# Patient Record
Sex: Female | Born: 1945 | Race: Black or African American | Hispanic: No | Smoking: Never smoker
Health system: Southern US, Community
[De-identification: ages and names within clinical notes are randomized; demographics above are authoritative.]

## PROBLEM LIST (undated history)

## (undated) DIAGNOSIS — E7800 Pure hypercholesterolemia, unspecified: Secondary | ICD-10-CM

## (undated) DIAGNOSIS — E119 Type 2 diabetes mellitus without complications: Secondary | ICD-10-CM

## (undated) HISTORY — PX: EYE SURGERY: SHX253

---

## 2018-06-21 DIAGNOSIS — Z86711 Personal history of pulmonary embolism: Secondary | ICD-10-CM

## 2018-06-21 HISTORY — DX: Personal history of pulmonary embolism: Z86.711

## 2018-10-19 ENCOUNTER — Encounter: Payer: Self-pay | Admitting: Family Medicine

## 2018-10-19 ENCOUNTER — Other Ambulatory Visit: Payer: Self-pay

## 2018-10-19 ENCOUNTER — Ambulatory Visit (INDEPENDENT_AMBULATORY_CARE_PROVIDER_SITE_OTHER): Payer: Medicare Other | Admitting: Family Medicine

## 2018-10-19 VITALS — BP 118/78 | HR 72 | Temp 96.5°F | Ht 63.0 in | Wt 138.0 lb

## 2018-10-19 DIAGNOSIS — Z7689 Persons encountering health services in other specified circumstances: Secondary | ICD-10-CM | POA: Diagnosis not present

## 2018-10-19 DIAGNOSIS — K219 Gastro-esophageal reflux disease without esophagitis: Secondary | ICD-10-CM | POA: Diagnosis not present

## 2018-10-19 DIAGNOSIS — E785 Hyperlipidemia, unspecified: Secondary | ICD-10-CM | POA: Diagnosis not present

## 2018-10-19 DIAGNOSIS — I2699 Other pulmonary embolism without acute cor pulmonale: Secondary | ICD-10-CM | POA: Diagnosis not present

## 2018-10-19 MED ORDER — APIXABAN 5 MG PO TABS: 5.0000 mg | ORAL_TABLET | Freq: Two times a day (BID) | ORAL | 4 refills | Status: DC

## 2018-10-19 MED ORDER — ATORVASTATIN CALCIUM 20 MG PO TABS: 20.0000 mg | ORAL_TABLET | Freq: Every day | ORAL | 3 refills | Status: DC

## 2018-10-19 MED ORDER — OMEPRAZOLE 20 MG PO CPDR: 20.0000 mg | DELAYED_RELEASE_CAPSULE | Freq: Every day | ORAL | 3 refills | Status: DC

## 2018-10-19 NOTE — Patient Instructions (Signed)
Food Choices for Gastroesophageal Reflux Disease, Adult When you have gastroesophageal reflux disease (GERD), the foods you eat and your eating habits are very important. Choosing the right foods can help ease your discomfort. Think about working with a nutrition specialist (dietitian) to help you make good choices. What are tips for following this plan?  Meals  Choose healthy foods that are low in fat, such as fruits, vegetables, whole grains, low-fat dairy products, and lean meat, fish, and poultry.  Eat small meals often instead of 3 large meals a day. Eat your meals slowly, and in a place where you are relaxed. Avoid bending over or lying down until 2-3 hours after eating.  Avoid eating meals 2-3 hours before bed.  Avoid drinking a lot of liquid with meals.  Cook foods using methods other than frying. Bake, grill, or broil food instead.  Avoid or limit: ? Chocolate. ? Peppermint or spearmint. ? Alcohol. ? Pepper. ? Black and decaffeinated coffee. ? Black and decaffeinated tea. ? Bubbly (carbonated) soft drinks. ? Caffeinated energy drinks and soft drinks.  Limit high-fat foods such as: ? Fatty meat or fried foods. ? Whole milk, cream, butter, or ice cream. ? Nuts and nut butters. ? Pastries, donuts, and sweets made with butter or shortening.  Avoid foods that cause symptoms. These foods may be different for everyone. Common foods that cause symptoms include: ? Tomatoes. ? Oranges, lemons, and limes. ? Peppers. ? Spicy food. ? Onions and garlic. ? Vinegar. Lifestyle  Maintain a healthy weight. Ask your doctor what weight is healthy for you. If you need to lose weight, work with your doctor to do so safely.  Exercise for at least 30 minutes for 5 or more days each week, or as told by your doctor.  Wear loose-fitting clothes.  Do not smoke. If you need help quitting, ask your doctor.  Sleep with the head of your bed higher than your feet. Use a wedge under the  mattress or blocks under the bed frame to raise the head of the bed. Summary  When you have gastroesophageal reflux disease (GERD), food and lifestyle choices are very important in easing your symptoms.  Eat small meals often instead of 3 large meals a day. Eat your meals slowly, and in a place where you are relaxed.  Limit high-fat foods such as fatty meat or fried foods.  Avoid bending over or lying down until 2-3 hours after eating.  Avoid peppermint and spearmint, caffeine, alcohol, and chocolate. This information is not intended to replace advice given to you by your health care provider. Make sure you discuss any questions you have with your health care provider. Document Released: 08/11/2011 Document Revised: 06/02/2018 Document Reviewed: 03/17/2016 Elsevier Patient Education  2020 [employer]  Gastroesophageal Reflux Disease, Adult Gastroesophageal reflux (GER) happens when acid from the stomach flows up into the tube that connects the mouth and the stomach (esophagus). Normally, food travels down the esophagus and stays in the stomach to be digested. With GER, food and stomach acid sometimes move back up into the esophagus. You may have a disease called gastroesophageal reflux disease (GERD) if the reflux:  Happens often.  Causes frequent or very bad symptoms.  Causes problems such as damage to the esophagus. When this happens, the esophagus becomes sore and swollen (inflamed). Over time, GERD can make small holes (ulcers) in the lining of the esophagus. What are the causes? This condition is caused by a problem with the muscle between the esophagus  and the stomach. When this muscle is weak or not normal, it does not close properly to keep food and acid from coming back up from the stomach. The muscle can be weak because of:  Tobacco use.  Pregnancy.  Having a certain type of hernia (hiatal hernia).  Alcohol use.  Certain foods and drinks, such as coffee, chocolate,  onions, and peppermint. What increases the risk? You are more likely to develop this condition if you:  Are overweight.  Have a disease that affects your connective tissue.  Use NSAID medicines. What are the signs or symptoms? Symptoms of this condition include:  Heartburn.  Difficult or painful swallowing.  The feeling of having a lump in the throat.  A bitter taste in the mouth.  Bad breath.  Having a lot of saliva.  Having an upset or bloated stomach.  Belching.  Chest pain. Different conditions can cause chest pain. Make sure you see your doctor if you have chest pain.  Shortness of breath or noisy breathing (wheezing).  Ongoing (chronic) cough or a cough at night.  Wearing away of the surface of teeth (tooth enamel).  Weight loss. How is this treated? Treatment will depend on how bad your symptoms are. Your doctor may suggest:  Changes to your diet.  Medicine.  Surgery. Follow these instructions at home: Eating and drinking   Follow a diet as told by your doctor. You may need to avoid foods and drinks such as: ? Coffee and tea (with or without caffeine). ? Drinks that contain alcohol. ? Energy drinks and sports drinks. ? Bubbly (carbonated) drinks or sodas. ? Chocolate and cocoa. ? Peppermint and mint flavorings. ? Garlic and onions. ? Horseradish. ? Spicy and acidic foods. These include peppers, chili powder, curry powder, vinegar, hot sauces, and BBQ sauce. ? Citrus fruit juices and citrus fruits, such as oranges, lemons, and limes. ? Tomato-based foods. These include red sauce, chili, salsa, and pizza with red sauce. ? Fried and fatty foods. These include donuts, french fries, potato chips, and high-fat dressings. ? High-fat meats. These include hot dogs, rib eye steak, sausage, ham, and bacon. ? High-fat dairy items, such as whole milk, butter, and cream cheese.  Eat small meals often. Avoid eating large meals.  Avoid drinking large amounts  of liquid with your meals.  Avoid eating meals during the 2-3 hours before bedtime.  Avoid lying down right after you eat.  Do not exercise right after you eat. Lifestyle   Do not use any products that contain nicotine or tobacco. These include cigarettes, e-cigarettes, and chewing tobacco. If you need help quitting, ask your doctor.  Try to lower your stress. If you need help doing this, ask your doctor.  If you are overweight, lose an amount of weight that is healthy for you. Ask your doctor about a safe weight loss goal. General instructions  Pay attention to any changes in your symptoms.  Take over-the-counter and prescription medicines only as told by your doctor. Do not take aspirin, ibuprofen, or other NSAIDs unless your doctor says it is okay.  Wear loose clothes. Do not wear anything tight around your waist.  Raise (elevate) the head of your bed about 6 inches (15 cm).  Avoid bending over if this makes your symptoms worse.  Keep all follow-up visits as told by your doctor. This is important. Contact a doctor if:  You have new symptoms.  You lose weight and you do not know why.  You have trouble  swallowing or it hurts to swallow.  You have wheezing or a cough that keeps happening.  Your symptoms do not get better with treatment.  You have a hoarse voice. Get help right away if:  You have pain in your arms, neck, jaw, teeth, or back.  You feel sweaty, dizzy, or light-headed.  You have chest pain or shortness of breath.  You throw up (vomit) and your throw-up looks like blood or coffee grounds.  You pass out (faint).  Your poop (stool) is bloody or black.  You cannot swallow, drink, or eat. Summary  If a person has gastroesophageal reflux disease (GERD), food and stomach acid move back up into the esophagus and cause symptoms or problems such as damage to the esophagus.  Treatment will depend on how bad your symptoms are.  Follow a diet as told by  your doctor.  Take all medicines only as told by your doctor. This information is not intended to replace advice given to you by your health care provider. Make sure you discuss any questions you have with your health care provider. Document Released: 07/29/2007 Document Revised: 08/18/2017 Document Reviewed: 08/18/2017 Elsevier Patient Education  2020 [employer].  Dyslipidemia Dyslipidemia is an imbalance of waxy, fat-like substances (lipids) in the blood. The body needs lipids in small amounts. Dyslipidemia often involves a high level of cholesterol or triglycerides, which are types of lipids. Common forms of dyslipidemia include:  High levels of LDL cholesterol. LDL is the type of cholesterol that causes fatty deposits (plaques) to build up in the blood vessels that carry blood away from your heart (arteries).  Low levels of HDL cholesterol. HDL cholesterol is the type of cholesterol that protects against heart disease. High levels of HDL remove the LDL buildup from arteries.  High levels of triglycerides. Triglycerides are a fatty substance in the blood that is linked to a buildup of plaques in the arteries. What are the causes? Primary dyslipidemia is caused by changes (mutations) in genes that are passed down through families (inherited). These mutations cause several types of dyslipidemia. Secondary dyslipidemia is caused by lifestyle choices and diseases that lead to dyslipidemia, such as:  Eating a diet that is high in animal fat.  Not getting enough exercise.  Having diabetes, kidney disease, liver disease, or thyroid disease.  Drinking large amounts of alcohol.  Using certain medicines. What increases the risk? You are more likely to develop this condition if you are an older man or if you are a woman who has gone through menopause. Other risk factors include:  Having a family history of dyslipidemia.  Taking certain medicines, including birth control pills, steroids,  some diuretics, and beta-blockers.  Smoking cigarettes.  Eating a high-fat diet.  Having certain medical conditions such as diabetes, polycystic ovary syndrome (PCOS), kidney disease, liver disease, or hypothyroidism.  Not exercising regularly.  Being overweight or obese with too much belly fat. What are the signs or symptoms? In most cases, dyslipidemia does not usually cause any symptoms. In severe cases, very high lipid levels can cause:  Fatty bumps under the skin (xanthomas).  White or gray ring around the black center (pupil) of the eye. Very high triglyceride levels can cause inflammation of the pancreas (pancreatitis). How is this diagnosed? Your health care provider may diagnose dyslipidemia based on a routine blood test (fasting blood test). Because most people do not have symptoms of the condition, this blood testing (lipid profile) is done on adults age 34 and older and  is repeated every 5 years. This test checks:  Total cholesterol. This measures the total amount of cholesterol in your blood, including LDL cholesterol, HDL cholesterol, and triglycerides. A healthy number is below 200.  LDL cholesterol. The target number for LDL cholesterol is different for each person, depending on individual risk factors. Ask your health care provider what your LDL cholesterol should be.  HDL cholesterol. An HDL level of 60 or higher is best because it helps to protect against heart disease. A number below 37 for men or below 61 for women increases the risk for heart disease.  Triglycerides. A healthy triglyceride number is below 150. If your lipid profile is abnormal, your health care provider may do other blood tests. How is this treated? Treatment depends on the type of dyslipidemia that you have and your other risk factors for heart disease and stroke. Your health care provider will have a target range for your lipid levels based on this information. For many people, this condition  may be treated by lifestyle changes, such as diet and exercise. Your health care provider may recommend that you:  Get regular exercise.  Make changes to your diet.  Quit smoking if you smoke. If diet changes and exercise do not help you reach your goals, your health care provider may also prescribe medicine to lower lipids. The most commonly prescribed type of medicine lowers your LDL cholesterol (statin drug). If you have a high triglyceride level, your provider may prescribe another type of drug (fibrate) or an omega-3 fish oil supplement, or both. Follow these instructions at home:  Eating and drinking  Follow instructions from your health care provider or dietitian about eating or drinking restrictions.  Eat a healthy diet as told by your health care provider. This can help you reach and maintain a healthy weight, lower your LDL cholesterol, and raise your HDL cholesterol. This may include: ? Limiting your calories, if you are overweight. ? Eating more fruits, vegetables, whole grains, fish, and lean meats. ? Limiting saturated fat, trans fat, and cholesterol.  If you drink alcohol: ? Limit how much you use. ? Be aware of how much alcohol is in your drink. In the [place] one drink equals one 12 oz bottle of beer (355 mL), one 5 oz glass of wine (148 mL), or one 1 oz glass of hard liquor (44 mL).  Do not drink alcohol if: ? Your health care provider tells you not to drink. ? You are pregnant, may be pregnant, or are planning to become pregnant. Activity  Get regular exercise. Start an exercise and strength training program as told by your health care provider. Ask your health care provider what activities are safe for you. Your health care provider may recommend: ? 30 minutes of aerobic activity 4-6 days a week. Brisk walking is an example of aerobic activity. ? Strength training 2 days a week. General instructions  Do not use any products that contain nicotine or tobacco, such as  cigarettes, e-cigarettes, and chewing tobacco. If you need help quitting, ask your health care provider.  Take over-the-counter and prescription medicines only as told by your health care provider. This includes supplements.  Keep all follow-up visits as told by your health care provider. Contact a health care provider if:  You are: ? Having trouble sticking to your exercise or diet plan. ? Struggling to quit smoking or control your use of alcohol. Summary  Dyslipidemia often involves a high level of cholesterol or triglycerides, which are  types of lipids.  Treatment depends on the type of dyslipidemia that you have and your other risk factors for heart disease and stroke.  For many people, treatment starts with lifestyle changes, such as diet and exercise.  Your health care provider may prescribe medicine to lower lipids. This information is not intended to replace advice given to you by your health care provider. Make sure you discuss any questions you have with your health care provider. Document Released: 02/14/2013 Document Revised: 10/04/2017 Document Reviewed: 09/10/2017 Elsevier Patient Education  2020 [employer]  Pulmonary Embolism  A pulmonary embolism (PE) is a sudden blockage or decrease of blood flow in one or both lungs. Most blockages come from a blood clot that forms in the vein of a lower leg, thigh, or arm (deep vein thrombosis, DVT) and travels to the lungs. A clot is blood that has thickened into a gel or solid. PE is a dangerous and life-threatening condition that needs to be treated right away. What are the causes? This condition is usually caused by a blood clot that forms in a vein and moves to the lungs. In rare cases, it may be caused by air, fat, part of a tumor, or other tissue that moves through the veins and into the lungs. What increases the risk? The following factors may make you more likely to develop this condition:  Experiencing a traumatic  injury, such as breaking a hip or leg.  Having: ? A spinal cord injury. ? Orthopedic surgery, especially hip or knee replacement. ? Any major surgery. ? A stroke. ? DVT. ? Blood clots or blood clotting disease. ? Jourdan-term (chronic) lung or heart disease. ? Cancer treated with chemotherapy. ? A central venous catheter.  Taking medicines that contain estrogen. These include birth control pills and hormone replacement therapy.  Being: ? Pregnant. ? In the period of time after your baby is delivered (postpartum). ? Older than age 75. ? Overweight. ? A smoker, especially if you have other risks. What are the signs or symptoms? Symptoms of this condition usually start suddenly and include:  Shortness of breath during activity or at rest.  Coughing, coughing up blood, or coughing up blood-tinged mucus.  Chest pain that is often worse with deep breaths.  Rapid or irregular heartbeat.  Feeling light-headed or dizzy.  Fainting.  Feeling anxious.  Fever.  Sweating.  Pain and swelling in a leg. This is a symptom of DVT, which can lead to PE. How is this diagnosed? This condition may be diagnosed based on:  Your medical history.  A physical exam.  Blood tests.  CT pulmonary angiogram. This test checks blood flow in and around your lungs.  Ventilation-perfusion scan, also called a lung VQ scan. This test measures air flow and blood flow to the lungs.  An ultrasound of the legs. How is this treated? Treatment for this condition depends on many factors, such as the cause of your PE, your risk for bleeding or developing more clots, and other medical conditions you have. Treatment aims to remove, dissolve, or stop blood clots from forming or growing larger. Treatment may include:  Medicines, such as: ? Blood thinning medicines (anticoagulants) to stop clots from forming. ? Medicines that dissolve clots (thrombolytics).  Procedures, such as: ? Using a flexible tube to  remove a blood clot (embolectomy) or to deliver medicine to destroy it (catheter-directed thrombolysis). ? Inserting a filter into a large vein that carries blood to the heart (inferior vena cava). This filter (  vena cava filter) catches blood clots before they reach the lungs. ? Surgery to remove the clot (surgical embolectomy). This is rare. You may need a combination of immediate, Fuller-term (up to 3 months after diagnosis), and extended (more than 3 months after diagnosis) treatments. Your treatment may continue for several months (maintenance therapy). You and your health care provider will work together to choose the treatment program that is best for you. Follow these instructions at home: Medicines  Take over-the-counter and prescription medicines only as told by your health care provider.  If you are taking an anticoagulant medicine: ? Take the medicine every day at the same time each day. ? Understand what foods and drugs interact with your medicine. ? Understand the side effects of this medicine, including excessive bruising or bleeding. Ask your health care provider or pharmacist about other side effects. General instructions  Wear a medical alert bracelet or carry a medical alert card that says you have had a PE and lists what medicines you take.  Ask your health care provider when you may return to your normal activities. Avoid sitting or lying for a Wisinski time without moving.  Maintain a healthy weight. Ask your health care provider what weight is healthy for you.  Do not use any products that contain nicotine or tobacco, such as cigarettes, e-cigarettes, and chewing tobacco. If you need help quitting, ask your health care provider.  Talk with your health care provider about any travel plans. It is important to make sure that you are still able to take your medicine while on trips.  Keep all follow-up visits as told by your health care provider. This is important. Contact a  health care provider if:  You missed a dose of your blood thinner medicine. Get help right away if:  You have: ? New or increased pain, swelling, warmth, or redness in an arm or leg. ? Numbness or tingling in an arm or leg. ? Shortness of breath during activity or at rest. ? A fever. ? Chest pain. ? A rapid or irregular heartbeat. ? A severe headache. ? Vision changes. ? A serious fall or accident, or you hit your head. ? Stomach (abdominal) pain. ? Blood in your vomit, stool, or urine. ? A cut that will not stop bleeding.  You cough up blood.  You feel light-headed or dizzy.  You cannot move your arms or legs.  You are confused or have memory loss. These symptoms may represent a serious problem that is an emergency. Do not wait to see if the symptoms will go away. Get medical help right away. Call your local emergency services (911 in the [place] Do not drive yourself to the hospital. Summary  A pulmonary embolism (PE) is a sudden blockage or decrease of blood flow in one or both lungs. PE is a dangerous and life-threatening condition that needs to be treated right away.  Treatments for this condition usually include medicines to thin your blood (anticoagulants) or medicines to break apart blood clots (thrombolytics).  If you are given blood thinners, it is important to take the medicine every day at the same time each day.  Understand what foods and drugs interact with any medicines that you are taking.  If you have signs of PE or DVT, call your local emergency services (911 in the [place] This information is not intended to replace advice given to you by your health care provider. Make sure you discuss any questions you have with your health care  provider. Document Released: 02/07/2000 Document Revised: 11/17/2017 Document Reviewed: 11/17/2017 Elsevier Patient Education  2020 [employer].

## 2018-10-19 NOTE — Progress Notes (Signed)
Patient presents to clinic today to establish care.  SUBJECTIVE: PMH: Pt is a 73 yo female with pmh sig for h/o PE and HLD.  Pt previously seen in [place], [place].  H/o PE: -pt with unprovoked PE, L lung 4 months ago -noted after having L side pain -on Eliquis BID x 4 months -states was running low on med and started taking it once daily -pt's previous provider sent her in a limited refill until she could est with a new provider -pt states she was never told how Soliday she had to take the med. -pt denies previous hx, hormone replacement, travel, or tobacco use  HLD: -on atorvastatin -tries to eat better -denies myalgias  Heartburn: -pt notes increased pain in esophagus with eating, increased gas, and decreased appetite -x 6 mo -becoming worse -symptoms mostly at night -taking tums and ginger ale -also with sensation of food getting stuck in esophagus and nausea -no issues with soft foods. -has not seen GI  Allergies: Sulfa-blisters on lips.  Also had a rxn to Sulfur 8 shampoo  Social hx: Pt retired from working at [employer].  Pt recently moved from [place] to the area.    Family Hx: Dad-heart Problems, HLD, HTN Daughter-desc, cancer Daughter-AAW Paternal aunt-breast cancer Paternal aunt- stomach cancer   History reviewed. No pertinent past medical history.  Past Surgical History:  Procedure Laterality Date  . CESAREAN SECTION      Current Outpatient Medications on File Prior to Visit  Medication Sig Dispense Refill  . apixaban (ELIQUIS) 5 MG TABS tablet Take 5 mg by mouth 2 (two) times daily.    Marland Kitchen. atorvastatin (LIPITOR) 20 MG tablet Take 20 mg by mouth daily.     No current facility-administered medications on file prior to visit.     Allergies  Allergen Reactions  . Sulfa Antibiotics Other (See Comments)    bristers    Family History  Problem Relation Age of Onset  . Hypertension Mother   . Hypertension Father     Social History    Socioeconomic History  . Marital status: Divorced    Spouse name: Not on file  . Number of children: Not on file  . Years of education: Not on file  . Highest education level: Not on file  Occupational History  . Not on file  Social Needs  . Financial resource strain: Not on file  . Food insecurity    Worry: Not on file    Inability: Not on file  . Transportation needs    Medical: Not on file    Non-medical: Not on file  Tobacco Use  . Smoking status: Never Smoker  . Smokeless tobacco: Never Used  Substance and Sexual Activity  . Alcohol use: Never    Frequency: Never  . Drug use: Never  . Sexual activity: Not on file  Lifestyle  . Physical activity    Days per week: Not on file    Minutes per session: Not on file  . Stress: Not on file  Relationships  . Social Musicianconnections    Talks on phone: Not on file    Gets together: Not on file    Attends religious service: Not on file    Active member of club or organization: Not on file    Attends meetings of clubs or organizations: Not on file    Relationship status: Not on file  . Intimate partner violence    Fear of current or ex partner: Not on  file    Emotionally abused: Not on file    Physically abused: Not on file    Forced sexual activity: Not on file  Other Topics Concern  . Not on file  Social History Narrative  . Not on file    ROS General: Denies fever, chills, night sweats, changes in weight, changes in appetite HEENT: Denies headaches, ear pain, changes in vision, rhinorrhea, sore throat CV: Denies CP, palpitations, SOB, orthopnea  +h/o PE Pulm: Denies SOB, cough, wheezing GI: Denies abdominal pain, nausea, vomiting, diarrhea, constipation  +nausea, heart burn, gas, GU: Denies dysuria, hematuria, frequency, vaginal discharge Msk: Denies muscle cramps, joint pains Neuro: Denies weakness, numbness, tingling Skin: Denies rashes, bruising Psych: Denies depression, anxiety, hallucinations   BP 118/78 (BP  Location: Left Arm, Patient Position: Sitting, Cuff Size: Normal)   Pulse 72   Temp (!) 96.5 F (35.8 C) (Oral)   Ht 5\' 3"  (1.6 m)   Wt 138 lb (62.6 kg)   SpO2 98%   BMI 24.45 kg/m   Physical Exam Gen. Pleasant, well developed, well-nourished, in NAD HEENT - [place]/AT, PERRL, EOMI, no scleral icterus, no nasal drainage, pharynx without erythema or exudate. Lungs: no use of accessory muscles, CTAB, no wheezes, rales or rhonchi Cardiovascular: RRR, No r/g/m, no peripheral edema Abdomen: BS present, soft, nontender,nondistended Musculoskeletal: No deformities, moves all four extremities, no cyanosis or clubbing, normal tone Neuro:  A&Ox3, CN II-XII intact, normal gait Skin:  Warm, dry, intact, no lesions   No results found for this or any previous visit (from the past 2160 hour(s)).  Assessment/Plan: Pulmonary embolism, unspecified chronicity, unspecified pulmonary embolism type, unspecified whether acute cor pulmonale present (HCC) -unprovoked PE 4 months ago.  Discussed continuing Eliquis for a total of 6 months then d/c'ing.  If develops another PE would need lifelong anticoagulation. -discussed [place] records from out of state.  - Plan: apixaban (ELIQUIS) 5 MG TABS tablet  Gastroesophageal reflux disease, esophagitis presence not specified  -encouraged to keep a food diary -discussed GI referral for continued symptoms after trial of PPI - Plan: omeprazole (PRILOSEC) 20 MG capsule  Hyperlipidemia, unspecified hyperlipidemia type  -lifestyle modifications - Plan: atorvastatin (LIPITOR) 20 MG tablet  Encounter to establish care -We reviewed the PMH, PSH, FH, SH, Meds and Allergies. -We provided refills for any medications we will prescribe as needed. -We addressed current concerns per orders and patient instructions. -We have asked for records for pertinent exams, studies, vaccines and notes from previous providers. -We have advised patient to follow up per instructions below.   F/u in 1 month  Grier Mitts, MD

## 2018-10-21 DIAGNOSIS — K219 Gastro-esophageal reflux disease without esophagitis: Secondary | ICD-10-CM | POA: Insufficient documentation

## 2018-10-21 DIAGNOSIS — I2699 Other pulmonary embolism without acute cor pulmonale: Secondary | ICD-10-CM | POA: Insufficient documentation

## 2018-10-21 DIAGNOSIS — E785 Hyperlipidemia, unspecified: Secondary | ICD-10-CM | POA: Insufficient documentation

## 2018-12-13 ENCOUNTER — Telehealth: Payer: Self-pay | Admitting: Family Medicine

## 2018-12-13 NOTE — Telephone Encounter (Signed)
ROI fax to [facility] for records

## 2019-01-17 ENCOUNTER — Telehealth (INDEPENDENT_AMBULATORY_CARE_PROVIDER_SITE_OTHER): Payer: Medicare Other | Admitting: Family Medicine

## 2019-01-17 ENCOUNTER — Other Ambulatory Visit: Payer: Self-pay

## 2019-01-17 ENCOUNTER — Other Ambulatory Visit (INDEPENDENT_AMBULATORY_CARE_PROVIDER_SITE_OTHER): Payer: Medicare Other

## 2019-01-17 ENCOUNTER — Ambulatory Visit (INDEPENDENT_AMBULATORY_CARE_PROVIDER_SITE_OTHER): Payer: Medicare Other

## 2019-01-17 DIAGNOSIS — N898 Other specified noninflammatory disorders of vagina: Secondary | ICD-10-CM | POA: Diagnosis not present

## 2019-01-17 DIAGNOSIS — M19012 Primary osteoarthritis, left shoulder: Secondary | ICD-10-CM | POA: Diagnosis not present

## 2019-01-17 DIAGNOSIS — M25512 Pain in left shoulder: Secondary | ICD-10-CM

## 2019-01-17 LAB — POCT URINALYSIS DIPSTICK
Glucose, UA: NEGATIVE
Leukocytes, UA: NEGATIVE
Protein, UA: NEGATIVE
Spec Grav, UA: >=1.03 — AB (ref 1.010–1.025)
Urobilinogen, UA: 0.2 E.U./dL
pH, UA: 6 (ref 5.0–8.0)

## 2019-01-17 NOTE — Progress Notes (Signed)
Virtual Visit via Telephone Note  I connected with [place] on 01/17/19 at  1:00 PM EST by telephone and verified that I am speaking with the correct person using two identifiers.   I discussed the limitations, risks, security and privacy concerns of performing an evaluation and management service by telephone and the availability of in person appointments. I also discussed with the patient that there may be a patient responsible charge related to this service. The patient expressed understanding and agreed to proceed.  Location patient: home Location provider: work or home office Participants present for the call: patient, provider Patient did not have a visit in the prior 7 days to address this/these issue(s).   History of Present Illness: Pt with bad L shoulder pain x 2 wk.  Unable to lift arm up about head.  Taking tylenol, and OTC rubs.  Pt cannot recall injury.  Started lifting 5 lbs wts prior to the pain.  Pt hears a click in her shoulder.  Pain worse at night and with certain movements.  Denies edema, erythema, weakness in UE.  Pt with milky white vaginal d/c x 8 days.  Initially had to wear a panty liner.  The amount of d/c has decreased some.  Denies urinary frequency, dysuria, back pain, vaginal irritation.  Drinking 3 bottles of water/ day.  Pt endorses recent protected sex.  Used lubricant.   Observations/Objective: Patient sounds cheerful and well on the phone. I do not appreciate any SOB. Speech and thought processing are grossly intact. Patient reported vitals:  Assessment and Plan: Acute pain of left shoulder  -ddx: AC arthritis, impingement syndrome, rotator cuff tear. -discussed supportive care -continue OTC rubs.  Consider Voltaren gel prn -consider referral to Ortho/PT based on imaging results.  - Plan: DG Shoulder Left  Vaginal discharge  -discussed various causes including physiologic d/c, yeast, BV, trich, UTI, etc. -will obtain UA.  Consider Aptima  swab -treat prn based on results. - Plan: POC Urinalysis Dipstick   Follow Up Instructions: F/u prn  [id number] 5-10 [id number] 11-20 [id number] 21-30 I did not refer this patient for an OV in the next 24 hours for this/these issue(s).  I discussed the assessment and treatment plan with the patient. The patient was provided an opportunity to ask questions and all were answered. The patient agreed with the plan and demonstrated an understanding of the instructions.   The patient was advised to call back or seek an in-person evaluation if the symptoms worsen or if the condition fails to improve as anticipated.  I provided 14 minutes of non-face-to-face time during this encounter.   Billie Ruddy, MD

## 2019-02-27 ENCOUNTER — Telehealth: Payer: Self-pay | Admitting: Family Medicine

## 2019-02-27 NOTE — Telephone Encounter (Signed)
Copied from CRM [id number]. Topic: General - Other >> Feb 27, 2019  1:57 PM Tamela Oddi wrote: Reason for CRM: Having symptoms of the herpes to the genitals.  CB# [phone].  Would like medication called in to pharmacy.  She stated that she has had it before and needs the cream.  CB# [phone]

## 2019-02-27 NOTE — Telephone Encounter (Signed)
See note

## 2019-02-28 ENCOUNTER — Other Ambulatory Visit: Payer: Self-pay | Admitting: Family Medicine

## 2019-02-28 ENCOUNTER — Encounter: Payer: Self-pay | Admitting: Family Medicine

## 2019-02-28 DIAGNOSIS — B009 Herpesviral infection, unspecified: Secondary | ICD-10-CM

## 2019-02-28 MED ORDER — ACYCLOVIR 5 % EX OINT: 1.0000 "application " | TOPICAL_OINTMENT | CUTANEOUS | 1 refills | Status: DC

## 2019-02-28 NOTE — Progress Notes (Signed)
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  No prior mention of HSV hx by pt noted.  Rx for acyclovir cream sent to pt's pharmacy.  Abbe Amsterdam, MD

## 2019-02-28 NOTE — Telephone Encounter (Signed)
Pt calling to check status. Pt states that she is having an outbreak of herpes and it is very painful. Please advise

## 2019-02-28 NOTE — Telephone Encounter (Signed)
I dont see this on her problem list nor see the medication on her list please advise

## 2019-02-28 NOTE — Telephone Encounter (Signed)
Telephone message just received by this provider.  Rx for acyclovir cream sent to pharmacy.  Please be advised pt failed to mention h/o HSV.

## 2019-02-28 NOTE — Telephone Encounter (Signed)
Patient calling back about this request. Patient requesting a call back as soon as possible to discuss.

## 2019-02-28 NOTE — Telephone Encounter (Signed)
Spoke with informed her I'm waiting on PCP to respond

## 2019-03-01 NOTE — Telephone Encounter (Signed)
Spoke with pt stated that she picked up Rx from her pharmacy and she has been using it. Advised to the office with any concerns

## 2019-03-06 NOTE — Telephone Encounter (Signed)
Matter addressed

## 2019-04-18 DIAGNOSIS — H18413 Arcus senilis, bilateral: Secondary | ICD-10-CM | POA: Diagnosis not present

## 2019-04-18 DIAGNOSIS — H25043 Posterior subcapsular polar age-related cataract, bilateral: Secondary | ICD-10-CM | POA: Diagnosis not present

## 2019-04-18 DIAGNOSIS — H2511 Age-related nuclear cataract, right eye: Secondary | ICD-10-CM | POA: Diagnosis not present

## 2019-04-18 DIAGNOSIS — H2513 Age-related nuclear cataract, bilateral: Secondary | ICD-10-CM | POA: Diagnosis not present

## 2019-04-18 DIAGNOSIS — H25013 Cortical age-related cataract, bilateral: Secondary | ICD-10-CM | POA: Diagnosis not present

## 2019-05-03 ENCOUNTER — Telehealth (INDEPENDENT_AMBULATORY_CARE_PROVIDER_SITE_OTHER): Payer: Medicare Other | Admitting: Family Medicine

## 2019-05-03 DIAGNOSIS — G8929 Other chronic pain: Secondary | ICD-10-CM | POA: Insufficient documentation

## 2019-05-03 DIAGNOSIS — M85822 Other specified disorders of bone density and structure, left upper arm: Secondary | ICD-10-CM | POA: Diagnosis not present

## 2019-05-03 DIAGNOSIS — M25511 Pain in right shoulder: Secondary | ICD-10-CM | POA: Diagnosis not present

## 2019-05-03 DIAGNOSIS — M19012 Primary osteoarthritis, left shoulder: Secondary | ICD-10-CM

## 2019-05-03 DIAGNOSIS — M25512 Pain in left shoulder: Secondary | ICD-10-CM

## 2019-05-03 DIAGNOSIS — H269 Unspecified cataract: Secondary | ICD-10-CM | POA: Diagnosis not present

## 2019-05-03 NOTE — Progress Notes (Signed)
Virtual Visit via Telephone Note  I connected with Betty [name] on 05/03/19 at  3:00 PM EST by telephone and verified that I am speaking with the correct person using two identifiers.   I discussed the limitations, risks, security and privacy concerns of performing an evaluation and management service by telephone and the availability of in person appointments. I also discussed with the patient that there may be a patient responsible charge related to this service. The patient expressed understanding and agreed to proceed.  Location patient: home Location provider: work or home office Participants present for the call: patient, provider Patient did not have a visit in the prior 7 days to address this/these issue(s).   History of Present Illness: Pt is a 74 yo female with pmh sig for h/o PE on eliquis, GERD, HLD, h/o HSV, shoulder pain.  Pt  taking tylenol and voltaren gel for R arm but it is not working.  She states she cannot lift her L arm.  Also has h/o arthritis in R shoulder.  Pt states shoulder is worse/stiff in am.  Xray 01/17/2019 with L AC arthritis and osteopenia.  Pt taking a daily MVI, vitamin C, vitamin D3 and Calcium.    Pt to have cataract surgery in the next few wks.  Has to use steroid eye gtts prior to procedure.  Trying to schedule appts around the surgery.  Hesitant to use another steroid at the same time.  Pt inquires about COVID vaccine.  Planned to get next month.  Observations/Objective: Patient sounds cheerful and well on the phone. I do not appreciate any SOB. Speech and thought processing are grossly intact. Patient reported vitals:  Assessment and Plan: Arthritis of left acromioclavicular joint  -continue supportive care -consider steroid injection - Plan: Ambulatory referral to Orthopedic Surgery, Ambulatory referral to Physical Therapy  Chronic pain of both shoulders  - Plan: Ambulatory referral to Orthopedic Surgery, Ambulatory referral to Physical  Therapy  Osteopenia of left upper arm -continue daily Vitamin D and Calcium supplements -will need Bone density Scan. Will order at [facility] in the next few months  Cataract, unspecified cataract type, unspecified laterality -continue f/u with Ophthalmology for removal   Follow Up Instructions: F/u prn Discussed CPE in the next few months  I did not refer this patient for an OV in the next 24 hours for this/these issue(s).  I discussed the assessment and treatment plan with the patient. The patient was provided an opportunity to ask questions and all were answered. The patient agreed with the plan and demonstrated an understanding of the instructions.   The patient was advised to call back or seek an in-person evaluation if the symptoms worsen or if the condition fails to improve as anticipated.  I provided 11 minutes of non-face-to-face time during this encounter.   Deeann Saint, MD

## 2019-05-05 ENCOUNTER — Other Ambulatory Visit: Payer: Self-pay | Admitting: Family Medicine

## 2019-05-05 DIAGNOSIS — K219 Gastro-esophageal reflux disease without esophagitis: Secondary | ICD-10-CM

## 2019-05-10 ENCOUNTER — Ambulatory Visit: Payer: Self-pay

## 2019-05-10 ENCOUNTER — Ambulatory Visit: Payer: Medicare Other | Admitting: Orthopaedic Surgery

## 2019-05-10 ENCOUNTER — Encounter: Payer: Self-pay | Admitting: Orthopaedic Surgery

## 2019-05-10 ENCOUNTER — Other Ambulatory Visit: Payer: Self-pay

## 2019-05-10 DIAGNOSIS — M19019 Primary osteoarthritis, unspecified shoulder: Secondary | ICD-10-CM

## 2019-05-10 DIAGNOSIS — M19012 Primary osteoarthritis, left shoulder: Secondary | ICD-10-CM

## 2019-05-10 MED ORDER — TRAMADOL HCL 50 MG PO TABS: 50.0000 mg | ORAL_TABLET | Freq: Four times a day (QID) | ORAL | 0 refills | Status: DC | PRN

## 2019-05-10 NOTE — Progress Notes (Signed)
Office Visit Note   Patient: Betty [name]           Date of Birth: March 30, 1945           MRN: [id number] Visit Date: 05/10/2019              Requested by: Deeann Saint, MD [address] [place],  [place] [address] PCP: Deeann Saint, MD   Assessment & Plan: Visit Diagnoses:  1. Primary osteoarthritis, left shoulder   2. AC joint arthropathy     Plan: Impression is left shoulder glenohumeral and AC osteoarthritis.  I believe the patient's symptoms are coming more from the glenohumeral joint.  For that reason, we will refer her to Dr. Prince Rome for an ultrasound-guided cortisone injection.  She will follow up with [place] as needed.  Follow-Up Instructions: Return if symptoms worsen or fail to improve.   Orders:  No orders of the defined types were placed in this encounter.  No orders of the defined types were placed in this encounter.     Procedures: No procedures performed   Clinical Data: No additional findings.   Subjective: Chief Complaint  Patient presents with  . Right Shoulder - Pain  . Left Shoulder - Pain    HPI patient is a pleasant 74 year old female who comes in today with left shoulder pain for the past 8 weeks.  No known injury or change in activity.  The pain she has is to the anterior and superior aspects.  Pain is aggravated with forward flexion, internal rotation and abduction of the shoulder.  She also notes some grinding and popping sensations as well.  She has been doing rotator cuff exercises and using Voltaren gel without significant relief of symptoms.  She denies any numbness, tingling or burning.  She is unable to take oral anti-inflammatories because she is on Eliquis due to previous PE.  She does note a remote cortisone injection to the left shoulder approximately 8 to 9 years ago which seem to have helped.  Review of Systems as detailed in HPI.  All others reviewed and are negative.   Objective: Vital Signs: There were no vitals  taken for this visit.  Physical Exam well-developed well-nourished female no acute distress.  Alert and oriented x3.  Ortho Exam examination of the left shoulder reveals approximately 100 degrees of forward flexion.  75 degrees of external rotation.  She can internally rotate to L5.  She has full strength throughout.  Negative empty can.  Positive cross body adduction.  She does have moderate tenderness to the [facility] joint.  She is neurovascularly intact distally.  Specialty Comments:  No specialty comments available.  Imaging: Previous imaging reviewed by me in canopy of the left shoulder shows moderate glenohumeral and AC degenerative changes.  No superior migration of the humeral head.   PMFS History: Patient Active Problem List   Diagnosis Date Noted  . Cataract 05/03/2019  . Osteopenia of left upper arm 05/03/2019  . Arthritis of left acromioclavicular joint 05/03/2019  . Chronic pain of both shoulders 05/03/2019  . 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  . Pulmonary embolism (HCC) 10/21/2018  . Gastroesophageal reflux disease 10/21/2018  . Hyperlipidemia 10/21/2018   History reviewed. No pertinent past medical history.  Family History  Problem Relation Age of Onset  . Hypertension Mother   . Hypertension Father     Past Surgical History:  Procedure Laterality Date  . CESAREAN SECTION     Social History  Occupational History  . Not on file  Tobacco Use  . Smoking status: Never Smoker  . Smokeless tobacco: Never Used  Substance and Sexual Activity  . Alcohol use: Never  . Drug use: Never  . Sexual activity: Not on file

## 2019-05-10 NOTE — Progress Notes (Signed)
Subjective: Patient is here for ultrasound-guided intra-articular left glenohumeral injection.    Objective:  Crepitus and decreased ROM.  Procedure: Ultrasound-guided left glenohumeral injection: After sterile prep with Betadine, injected 8 cc 1% lidocaine without epinephrine and 40 mg methylprednisolone using a 22-gauge spinal needle, passing the needle through approach into the glenohumeral joint.  Injectate seen filling joint capsule.

## 2019-05-12 DIAGNOSIS — H2512 Age-related nuclear cataract, left eye: Secondary | ICD-10-CM | POA: Diagnosis not present

## 2019-05-12 DIAGNOSIS — H2511 Age-related nuclear cataract, right eye: Secondary | ICD-10-CM | POA: Diagnosis not present

## 2019-05-29 ENCOUNTER — Ambulatory Visit: Payer: Medicare Other | Admitting: Physical Therapy

## 2019-06-01 ENCOUNTER — Ambulatory Visit: Payer: Medicare Other

## 2019-06-06 ENCOUNTER — Encounter: Payer: Medicare Other | Admitting: Physical Therapy

## 2019-06-09 DIAGNOSIS — H2512 Age-related nuclear cataract, left eye: Secondary | ICD-10-CM | POA: Diagnosis not present

## 2019-06-24 ENCOUNTER — Other Ambulatory Visit: Payer: Self-pay | Admitting: Family Medicine

## 2019-06-24 DIAGNOSIS — I2699 Other pulmonary embolism without acute cor pulmonale: Secondary | ICD-10-CM

## 2019-07-05 ENCOUNTER — Observation Stay (HOSPITAL_COMMUNITY): Payer: Medicare Other

## 2019-07-05 ENCOUNTER — Other Ambulatory Visit: Payer: Self-pay

## 2019-07-05 ENCOUNTER — Encounter (HOSPITAL_COMMUNITY): Payer: Self-pay

## 2019-07-05 ENCOUNTER — Telehealth (INDEPENDENT_AMBULATORY_CARE_PROVIDER_SITE_OTHER): Payer: Medicare Other | Admitting: Family Medicine

## 2019-07-05 ENCOUNTER — Emergency Department (HOSPITAL_COMMUNITY): Payer: Medicare Other

## 2019-07-05 ENCOUNTER — Observation Stay (HOSPITAL_COMMUNITY)
Admission: EM | Admit: 2019-07-05 | Discharge: 2019-07-07 | Disposition: A | Discharge status: Home / Self Care | Payer: Medicare Other | Attending: Physician Assistant | Admitting: Physician Assistant

## 2019-07-05 ENCOUNTER — Encounter: Payer: Self-pay | Admitting: Family Medicine

## 2019-07-05 DIAGNOSIS — K3580 Unspecified acute appendicitis: Secondary | ICD-10-CM | POA: Diagnosis not present

## 2019-07-05 DIAGNOSIS — Z01818 Encounter for other preprocedural examination: Secondary | ICD-10-CM | POA: Diagnosis not present

## 2019-07-05 DIAGNOSIS — K219 Gastro-esophageal reflux disease without esophagitis: Secondary | ICD-10-CM | POA: Insufficient documentation

## 2019-07-05 DIAGNOSIS — Z91148 Patient's other noncompliance with medication regimen for other reason: Secondary | ICD-10-CM

## 2019-07-05 DIAGNOSIS — Z86711 Personal history of pulmonary embolism: Secondary | ICD-10-CM | POA: Insufficient documentation

## 2019-07-05 DIAGNOSIS — K353 Acute appendicitis with localized peritonitis, without perforation or gangrene: Secondary | ICD-10-CM

## 2019-07-05 DIAGNOSIS — K3533 Acute appendicitis with perforation and localized peritonitis, with abscess: Principal | ICD-10-CM | POA: Insufficient documentation

## 2019-07-05 DIAGNOSIS — E1136 Type 2 diabetes mellitus with diabetic cataract: Secondary | ICD-10-CM | POA: Diagnosis not present

## 2019-07-05 DIAGNOSIS — B009 Herpesviral infection, unspecified: Secondary | ICD-10-CM | POA: Diagnosis present

## 2019-07-05 DIAGNOSIS — Z0389 Encounter for observation for other suspected diseases and conditions ruled out: Secondary | ICD-10-CM | POA: Diagnosis not present

## 2019-07-05 DIAGNOSIS — E785 Hyperlipidemia, unspecified: Secondary | ICD-10-CM | POA: Insufficient documentation

## 2019-07-05 DIAGNOSIS — Z20822 Contact with and (suspected) exposure to covid-19: Secondary | ICD-10-CM | POA: Diagnosis not present

## 2019-07-05 DIAGNOSIS — Z9114 Patient's other noncompliance with medication regimen: Secondary | ICD-10-CM

## 2019-07-05 DIAGNOSIS — E7800 Pure hypercholesterolemia, unspecified: Secondary | ICD-10-CM | POA: Insufficient documentation

## 2019-07-05 DIAGNOSIS — K3589 Other acute appendicitis without perforation or gangrene: Secondary | ICD-10-CM | POA: Insufficient documentation

## 2019-07-05 DIAGNOSIS — R1031 Right lower quadrant pain: Secondary | ICD-10-CM

## 2019-07-05 DIAGNOSIS — M19012 Primary osteoarthritis, left shoulder: Secondary | ICD-10-CM | POA: Diagnosis not present

## 2019-07-05 DIAGNOSIS — Z79899 Other long term (current) drug therapy: Secondary | ICD-10-CM | POA: Insufficient documentation

## 2019-07-05 DIAGNOSIS — Z882 Allergy status to sulfonamides status: Secondary | ICD-10-CM | POA: Insufficient documentation

## 2019-07-05 DIAGNOSIS — Z7901 Long term (current) use of anticoagulants: Secondary | ICD-10-CM | POA: Diagnosis not present

## 2019-07-05 DIAGNOSIS — Z03818 Encounter for observation for suspected exposure to other biological agents ruled out: Secondary | ICD-10-CM | POA: Diagnosis not present

## 2019-07-05 DIAGNOSIS — M85822 Other specified disorders of bone density and structure, left upper arm: Secondary | ICD-10-CM

## 2019-07-05 DIAGNOSIS — E119 Type 2 diabetes mellitus without complications: Secondary | ICD-10-CM | POA: Insufficient documentation

## 2019-07-05 HISTORY — DX: Type 2 diabetes mellitus without complications: E11.9

## 2019-07-05 HISTORY — DX: Pure hypercholesterolemia, unspecified: E78.00

## 2019-07-05 LAB — URINALYSIS, ROUTINE W REFLEX MICROSCOPIC
Bacteria, UA: NONE SEEN
Bilirubin Urine: NEGATIVE
Glucose, UA: NEGATIVE mg/dL
Hgb urine dipstick: NEGATIVE
Ketones, ur: NEGATIVE mg/dL
Nitrite: NEGATIVE
Protein, ur: NEGATIVE mg/dL
Specific Gravity, Urine: 1.018 (ref 1.005–1.030)
pH: 7 (ref 5.0–8.0)

## 2019-07-05 LAB — COMPREHENSIVE METABOLIC PANEL
ALT: 23 U/L (ref 0–44)
AST: 25 U/L (ref 15–41)
Albumin: 4.6 g/dL (ref 3.5–5.0)
Alkaline Phosphatase: 63 U/L (ref 38–126)
Anion gap: 8 (ref 5–15)
BUN: 8 mg/dL (ref 8–23)
CO2: 27 mmol/L (ref 22–32)
Calcium: 9.5 mg/dL (ref 8.9–10.3)
Chloride: 109 mmol/L (ref 98–111)
Creatinine, Ser: 0.77 mg/dL (ref 0.44–1.00)
GFR calc Af Amer: >60 mL/min (ref >60)
GFR calc non Af Amer: >60 mL/min (ref >60)
Glucose, Bld: 78 mg/dL (ref 70–99)
Potassium: 4.4 mmol/L (ref 3.5–5.1)
Sodium: 144 mmol/L (ref 135–145)
Total Bilirubin: 1.1 mg/dL (ref 0.3–1.2)
Total Protein: 7.7 g/dL (ref 6.5–8.1)

## 2019-07-05 LAB — CBC
HCT: 47.4 % — ABNORMAL HIGH (ref 36.0–46.0)
Hemoglobin: 15.4 g/dL — ABNORMAL HIGH (ref 12.0–15.0)
MCH: 30.7 pg (ref 26.0–34.0)
MCHC: 32.5 g/dL (ref 30.0–36.0)
MCV: 94.6 fL (ref 80.0–100.0)
Platelets: 235 10*3/uL (ref 150–400)
RBC: 5.01 MIL/uL (ref 3.87–5.11)
RDW: 12.3 % (ref 11.5–15.5)
WBC: 9.1 10*3/uL (ref 4.0–10.5)
nRBC: 0 % (ref 0.0–0.2)

## 2019-07-05 LAB — HEMOGLOBIN A1C
Hgb A1c MFr Bld: 5.7 % — ABNORMAL HIGH (ref 4.8–5.6)
Mean Plasma Glucose: 116.89 mg/dL

## 2019-07-05 LAB — LIPASE, BLOOD: Lipase: 25 U/L (ref 11–51)

## 2019-07-05 LAB — SARS CORONAVIRUS 2 BY RT PCR (HOSPITAL ORDER, PERFORMED IN ~~LOC~~ HOSPITAL LAB): SARS Coronavirus 2: NEGATIVE

## 2019-07-05 MED ORDER — IOHEXOL 300 MG/ML  SOLN
100.0000 mL | Freq: Once | INTRAMUSCULAR | Status: AC | PRN
  Administered 2019-07-05: 100 mL via INTRAVENOUS

## 2019-07-05 MED ORDER — METRONIDAZOLE IN NACL 5-0.79 MG/ML-% IV SOLN
500.0000 mg | Freq: Three times a day (TID) | INTRAVENOUS | Status: DC
  Administered 2019-07-05 – 2019-07-07 (×5): 500 mg via INTRAVENOUS
  Filled 2019-07-05 (×5): qty 100

## 2019-07-05 MED ORDER — SODIUM CHLORIDE 0.9% FLUSH: 3.0000 mL | Freq: Once | INTRAVENOUS | Status: DC

## 2019-07-05 MED ORDER — SODIUM CHLORIDE 0.9 % IV SOLN
2.0000 g | INTRAVENOUS | Status: AC
  Administered 2019-07-06: 2 g via INTRAVENOUS
  Filled 2019-07-05: qty 2

## 2019-07-05 MED ORDER — BUPIVACAINE LIPOSOME 1.3 % IJ SUSP
20.0000 mL | INTRAMUSCULAR | Status: DC
  Filled 2019-07-05 (×2): qty 20

## 2019-07-05 MED ORDER — ACETAMINOPHEN 325 MG PO TABS
650.0000 mg | ORAL_TABLET | Freq: Four times a day (QID) | ORAL | Status: DC | PRN
  Administered 2019-07-05: 650 mg via ORAL
  Filled 2019-07-05: qty 2

## 2019-07-05 MED ORDER — LACTATED RINGERS IV SOLN: INTRAVENOUS | Status: DC

## 2019-07-05 MED ORDER — ACETAMINOPHEN 500 MG PO TABS
1000.0000 mg | ORAL_TABLET | ORAL | Status: AC
  Administered 2019-07-06: 1000 mg via ORAL
  Filled 2019-07-05: qty 2

## 2019-07-05 MED ORDER — SCOPOLAMINE 1 MG/3DAYS TD PT72
1.0000 | MEDICATED_PATCH | TRANSDERMAL | Status: DC
  Administered 2019-07-06: 1.5 mg via TRANSDERMAL
  Filled 2019-07-05: qty 1

## 2019-07-05 MED ORDER — CHLORHEXIDINE GLUCONATE CLOTH 2 % EX PADS
6.0000 | MEDICATED_PAD | Freq: Once | CUTANEOUS | Status: AC
  Administered 2019-07-06: 6 via TOPICAL

## 2019-07-05 MED ORDER — ACETAMINOPHEN 650 MG RE SUPP: 650.0000 mg | Freq: Four times a day (QID) | RECTAL | Status: DC | PRN

## 2019-07-05 MED ORDER — SODIUM CHLORIDE 0.9 % IV SOLN
2.0000 g | INTRAVENOUS | Status: DC
  Administered 2019-07-05 – 2019-07-06 (×2): 2 g via INTRAVENOUS
  Filled 2019-07-05 (×2): qty 2
  Filled 2019-07-05: qty 20

## 2019-07-05 MED ORDER — GABAPENTIN 300 MG PO CAPS
300.0000 mg | ORAL_CAPSULE | ORAL | Status: AC
  Administered 2019-07-06: 300 mg via ORAL
  Filled 2019-07-05: qty 1

## 2019-07-05 MED ORDER — FAMOTIDINE IN NACL 20-0.9 MG/50ML-% IV SOLN
20.0000 mg | Freq: Two times a day (BID) | INTRAVENOUS | Status: DC
  Administered 2019-07-05 – 2019-07-06 (×2): 20 mg via INTRAVENOUS
  Filled 2019-07-05 (×3): qty 50

## 2019-07-05 MED ORDER — DIPHENHYDRAMINE HCL 50 MG/ML IJ SOLN: 12.5000 mg | Freq: Four times a day (QID) | INTRAMUSCULAR | Status: DC | PRN

## 2019-07-05 MED ORDER — ONDANSETRON HCL 4 MG/2ML IJ SOLN: 4.0000 mg | Freq: Four times a day (QID) | INTRAMUSCULAR | Status: DC | PRN

## 2019-07-05 MED ORDER — LACTATED RINGERS IV BOLUS: 1000.0000 mL | Freq: Three times a day (TID) | INTRAVENOUS | Status: AC | PRN

## 2019-07-05 MED ORDER — OXYCODONE HCL 5 MG PO TABS: 5.0000 mg | ORAL_TABLET | ORAL | Status: DC | PRN

## 2019-07-05 MED ORDER — CHLORHEXIDINE GLUCONATE CLOTH 2 % EX PADS
6.0000 | MEDICATED_PAD | Freq: Once | CUTANEOUS | Status: AC
  Administered 2019-07-05: 6 via TOPICAL

## 2019-07-05 MED ORDER — DIPHENHYDRAMINE HCL 12.5 MG/5ML PO ELIX: 12.5000 mg | ORAL_SOLUTION | Freq: Four times a day (QID) | ORAL | Status: DC | PRN

## 2019-07-05 MED ORDER — LACTATED RINGERS IV BOLUS
1000.0000 mL | Freq: Once | INTRAVENOUS | Status: AC
  Administered 2019-07-05: 20:00:00 1000 mL via INTRAVENOUS

## 2019-07-05 MED ORDER — SODIUM CHLORIDE (PF) 0.9 % IJ SOLN
INTRAMUSCULAR | Status: AC
  Filled 2019-07-05: qty 50

## 2019-07-05 MED ORDER — ONDANSETRON 4 MG PO TBDP: 4.0000 mg | ORAL_TABLET | Freq: Four times a day (QID) | ORAL | Status: DC | PRN

## 2019-07-05 MED ORDER — MORPHINE SULFATE (PF) 2 MG/ML IV SOLN: 1.0000 mg | INTRAVENOUS | Status: DC | PRN

## 2019-07-05 NOTE — Anesthesia Preprocedure Evaluation (Addendum)
Anesthesia Evaluation  Patient identified by MRN, date of birth, ID band Patient awake    Reviewed: Allergy & Precautions, NPO status , Patient's Chart, lab work & pertinent test results  History of Anesthesia Complications Negative for: history of anesthetic complications  Airway Mallampati: II  TM Distance: >3 FB Neck ROM: Full    Dental  (+) Edentulous Upper   Pulmonary PE   Pulmonary exam normal        Cardiovascular negative cardio ROS Normal cardiovascular exam     Neuro/Psych negative neurological ROS  negative psych ROS   GI/Hepatic Neg liver ROS, GERD  ,Acute appendicitis   Endo/Other  negative endocrine ROS  Renal/GU negative Renal ROS  negative genitourinary   Musculoskeletal negative musculoskeletal ROS (+)   Abdominal   Peds  Hematology Eliquis   Anesthesia Other Findings  HLD  Reproductive/Obstetrics                            Anesthesia Physical Anesthesia Plan  ASA: II  Anesthesia Plan: General   Post-op Pain Management:    Induction: Intravenous  PONV Risk Score and Plan: 4 or greater and Ondansetron, Dexamethasone, Treatment may vary due to age or medical condition and Midazolam  Airway Management Planned: Oral ETT  Additional Equipment: None  Intra-op Plan:   Post-operative Plan: Extubation in OR  Informed Consent: I have reviewed the patients History and Physical, chart, labs and discussed the procedure including the risks, benefits and alternatives for the proposed anesthesia with the patient or authorized representative who has indicated his/her understanding and acceptance.     Dental advisory given  Plan Discussed with:   Anesthesia Plan Comments:        Anesthesia Quick Evaluation

## 2019-07-05 NOTE — Progress Notes (Signed)
Please see note  Plan lap appy this admission.  Will wait >24 hours from holding Eliquis  The anatomy & physiology of the digestive tract was discussed.  The pathophysiology of appendicitis and other appendiceal disorders were discussed.  Natural history risks without surgery was discussed.   I feel the risks of no intervention will lead to serious problems that outweigh the operative risks; therefore, I recommended diagnostic laparoscopy with removal of appendix to remove the pathology.  Laparoscopic & open techniques were discussed.   I noted a good likelihood this will help address the problem.   Risks such as bleeding, infection, abscess, leak, reoperation, injury to other organs, need for repair of tissues / organs, possible ostomy, hernia, heart attack, stroke, death, and other risks were discussed.  Goals of post-operative recovery were discussed as well.  We will work to minimize complications.  Questions were answered.  The patient expresses understanding & wishes to proceed with surgery.  Chauncey Bruno C. Gelena Klosinski, MD, FACS, MASCRS Gastrointestinal and Minimally Invasive Surgery  [facility] [address], [address] [place], [place] [address] [phone] Fax [phone] Main/Paging  CONTACT INFORMATION: Weekday (9AM-5PM) concerns: Call [facility] main office at [phone] Weeknight (5PM-9AM) or Weekend/Holiday concerns: Check [email or web address] for General Surgery [facility] coverage (Please, do not use SecureChat as it is not reliable communication to surgeons for patient care)      

## 2019-07-05 NOTE — H&P (Signed)
[facility] Admission Note  Betty [name] 05-30-1945  [id number].    Requesting MD: D.r Benjiman Core Chief Complaint: abdominal pain Reason for Consult: Acute appendicitis Local PCP:  Abbe Amsterdam MD  HPI:  Patient is a 74 year old female presented to the ED today complaining of 3 days of mid abdominal pain.  Patient reports pain become progressively worse she denied any nausea, vomiting, or diarrhea.  She was evaluated over the phone by a telemed physician earlier today with abdominal pain since Sunday, 07/02/2019.  Pain started around her navel and travels to her right flank and side. Currently pain is most severe RLQ.  She originally  described the pain is 6/10. She was not able to sleep last PM due to pain.  She has only taken Tylenol for the pain so far.  She was referred to the ED for further evaluation.  Work-up in the [facility] emergency department shows a temperature of 98.2, respiratory rate of 16, pulse of 80 blood pressure 145/87.  Sats are 100% on room air.  CMP is entirely normal.  Lipase is 25.  WBC is 9.1, hemoglobin 15.4, hematocrit 47.4.  Platelets 235,000.  Urinalysis is unremarkable 6-10 red cells and white cells per high-powered field.  Nitrate was negative.  CT of the abdomen/pelvis with contrast shows focal thickening of the mid to distal appendix with mucosal hyperemia and surrounding periappendiceal stranding x-rays of possible fluid.  No extraluminal gas or organized collection.  This was thought to be compatible with acute appendicitis.  Additionally she has acute hepatic steatosis and colonic diverticulosis without evidence of diverticulitis.  We are asked to see.  Past medical history includes history for pulmonary embolism, GERD, hyperlipidemia osteopenia, arthritis and HSV.  ROS: Review of Systems  Constitutional: Negative.   HENT: Negative.   Eyes: Negative.   Respiratory: Negative.   Cardiovascular: Negative.   Gastrointestinal: Positive for  abdominal pain and constipation (she has some chronic mild constipation). Negative for blood in stool, diarrhea, heartburn, melena, nausea and vomiting.  Genitourinary: Negative.   Musculoskeletal: Negative.   Skin: Negative.   Neurological: Negative.  Dizziness: on Eliquis; last dose this AM.  Endo/Heme/Allergies: Bruises/bleeds easily.  Psychiatric/Behavioral: Negative.     Family History  Problem Relation Age of Onset  . Hypertension Mother   . Hypertension Father     Past Medical History:  Diagnosis Date  . 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 High cholesterol     Past Surgical History:  Procedure Laterality Date  . CESAREAN SECTION    . EYE SURGERY      Social History:  reports that she has never smoked. She has never used smokeless tobacco. She reports that she does not drink alcohol or use drugs.  Allergies:  Allergies  Allergen Reactions  . Sulfa Antibiotics Other (See Comments)    bristers    Prior to Admission medications   Medication Sig Start Date End Date Taking? Authorizing Provider  acyclovir ointment (ZOVIRAX) 5 % Apply 1 application topically every 3 (three) hours. 02/28/19   Deeann Saint, MD  atorvastatin (LIPITOR) 20 MG tablet Take 1 tablet (20 mg total) by mouth daily. 10/19/18   Deeann Saint, MD  ELIQUIS 5 MG TABS tablet Take 1 tablet by mouth twice daily 06/26/19   Deeann Saint, MD  omeprazole (PRILOSEC) 20 MG capsule Take 1 capsule by mouth once daily 05/05/19   Deeann Saint, MD  traMADol (ULTRAM) 50 MG tablet  Take 1 tablet (50 mg total) by mouth every 6 (six) hours as needed. 05/10/19   Hilts, Legrand Como, MD      Blood pressure (!) 145/87, pulse 80, temperature 98.2 F (36.8 C), temperature source Oral, resp. rate 16, height 5\' 3"  (1.6 m), weight 64.9 kg, SpO2 100 %. Physical Exam:  General: pleasant, WD, AA female who is laying in bed in NAD, also ambulating in the room. HEENT: head is normocephalic,  atraumatic.  Sclera are noninjected Pupils are equal.  Ears and nose without any masses or lesions.  Mouth is pink and moist Heart: regular, rate, and rhythm.  Normal s1,s2. No obvious murmurs, gallops, or rubs noted.  Palpable radial and pedal pulses bilaterally Lungs: CTAB, no wheezes, rhonchi, or rales noted.  Respiratory effort nonlabored Abd: soft, complains of pain right side and right flank.  Most tender RLQ. NT, ND, +BS, no masses, hernias, or organomegaly. Midline surg scar below the umbilicus - C section. MS: all 4 extremities are symmetrical with no cyanosis, clubbing, or edema. Skin: warm and dry with no masses, lesions, or rashes Neuro: Cranial nerves 2-12 grossly intact, sensation is normal throughout Psych: A&Ox3 with an appropriate affect.   Results for orders placed or performed during the hospital encounter of 07/05/19 (from the past 48 hour(s))  Lipase, blood     Status: None   Collection Time: 07/05/19  2:07 PM  Result Value Ref Range   Lipase 25 11 - 51 U/L    Comment: Performed at [facility], [place] [address], [place], [place] [address]  Comprehensive metabolic panel     Status: None   Collection Time: 07/05/19  2:07 PM  Result Value Ref Range   Sodium 144 135 - 145 mmol/L   Potassium 4.4 3.5 - 5.1 mmol/L   Chloride 109 98 - 111 mmol/L   CO2 27 22 - 32 mmol/L   Glucose, Bld 78 70 - 99 mg/dL    Comment: Glucose reference range applies only to samples taken after fasting for at least 8 hours.   BUN 8 8 - 23 mg/dL   Creatinine, Ser 0.77 0.44 - 1.00 mg/dL   Calcium 9.5 8.9 - 10.3 mg/dL   Total Protein 7.7 6.5 - 8.1 g/dL   Albumin 4.6 3.5 - 5.0 g/dL   AST 25 15 - 41 U/L   ALT 23 0 - 44 U/L   Alkaline Phosphatase 63 38 - 126 U/L   Total Bilirubin 1.1 0.3 - 1.2 mg/dL   GFR calc non Af Amer >60 >60 mL/min   GFR calc Af Amer >60 >60 mL/min   Anion gap 8 5 - 15    Comment: Performed at [facility], [place] [address],  [place], [place] [address]  CBC     Status: Abnormal   Collection Time: 07/05/19  2:07 PM  Result Value Ref Range   WBC 9.1 4.0 - 10.5 K/uL   RBC 5.01 3.87 - 5.11 MIL/uL   Hemoglobin 15.4 (H) 12.0 - 15.0 g/dL   HCT 47.4 (H) 36.0 - 46.0 %   MCV 94.6 80.0 - 100.0 fL   MCH 30.7 26.0 - 34.0 pg   MCHC 32.5 30.0 - 36.0 g/dL   RDW 12.3 11.5 - 15.5 %   Platelets 235 150 - 400 K/uL   nRBC 0.0 0.0 - 0.2 %    Comment: Performed at [facility], [place] [address] [place], [place] [address]  Urinalysis, Routine w reflex microscopic  Status: Abnormal   Collection Time: 07/05/19  2:07 PM  Result Value Ref Range   Color, Urine YELLOW YELLOW   APPearance CLEAR CLEAR   Specific Gravity, Urine 1.018 1.005 - 1.030   pH 7.0 5.0 - 8.0   Glucose, UA NEGATIVE NEGATIVE mg/dL   Hgb urine dipstick NEGATIVE NEGATIVE   Bilirubin Urine NEGATIVE NEGATIVE   Ketones, ur NEGATIVE NEGATIVE mg/dL   Protein, ur NEGATIVE NEGATIVE mg/dL   Nitrite NEGATIVE NEGATIVE   Leukocytes,Ua LARGE (A) NEGATIVE   RBC / HPF 6-10 0 - 5 RBC/hpf   WBC, UA 6-10 0 - 5 WBC/hpf   Bacteria, UA NONE SEEN NONE SEEN   Squamous Epithelial / LPF 0-5 0 - 5   Mucus PRESENT     Comment: Performed at [facility], [address], [place], [place] [address]   CT ABDOMEN PELVIS W CONTRAST  Result Date: 07/05/2019 CLINICAL DATA:  Right lower quadrant pain, appendicitis suspected EXAM: CT ABDOMEN AND PELVIS WITH CONTRAST TECHNIQUE: Multidetector CT imaging of the abdomen and pelvis was performed using the standard protocol following bolus administration of intravenous contrast. CONTRAST:  OMNIPAQUE IOHEXOL 300 MG/ML  SOLN COMPARISON:  None. FINDINGS: Lower chest: Mixed area bandlike scarring and atelectasis in the left lung base. Lung bases otherwise clear. Normal heart size. No pericardial effusion. Hepatobiliary: Diffuse hepatic hypoattenuation compatible with hepatic steatosis. No focal liver abnormality is  seen. No gallstones, gallbladder wall thickening, or biliary dilatation. Pancreas: Partial fatty replacement of the pancreas. No concerning pancreatic lesions or ductal dilatation. No peripancreatic inflammation. Spleen: Normal in size without focal abnormality. Adrenals/Urinary Tract: Adrenal glands are unremarkable. Kidneys are normal, without renal calculi, focal lesion, or hydronephrosis. Bladder is unremarkable. Stomach/Bowel: Distal esophagus, stomach and duodenal sweep are unremarkable. No small bowel wall thickening or dilatation. No evidence of obstruction. Focal with thickening of the mid to distal appendix with mucosal hyperemia and surrounding periappendiceal stranding and trace fluid possibly reactive. No extraluminal gas or organized collection. No colonic dilatation or wall thickening. Scattered colonic diverticula without focal pericolonic inflammation to suggest diverticulitis. Vascular/Lymphatic: No significant vascular findings are present. No enlarged abdominal or pelvic lymph nodes. Reproductive: Normal appearance of the uterus and adnexal structures. Other: Trace fluid in the right pericolic gutter adjacent the appendix, as above. Musculoskeletal: Multilevel degenerative changes are present in the imaged portions of the spine. Corticated fragment adjacent the inferior aspect of the left pubic body, may reflect prior avulsion or enthesopathic change. IMPRESSION: 1. Findings compatible with acute appendicitis. Trace fluid in the adjacent pericolic gutter is likely reactive in the absence of other convincing signs of perforation. 2. Hepatic steatosis. 3. Colonic diverticulosis without evidence of diverticulitis. Electronically Signed   By: Kreg Shropshire M.D.   On: 07/05/2019 15:32      Assessment/Plan Hx PE  - on Eliquis, last dose this AM GERD Hyperlipidemia Osteopenia Arthritis Recent cataract surgery  Acute appendicitis  FEN:  Sips/Chips ID:  Rocephin/Flagyl DVT:  SCD's   Plan:   Dr. Michaell Cowing has reviewed the CT, examined the patient, and agree's with the diagnosis.  We plan to admit to observation, IV fluids, IV antibiotics, COVID study pending.  We would plan surgery tonight if it were not for her anticoagulation; she had her last dose of Eliquis this AM.  WE will plan surgery in the AM tomorrow.       Sherrie George [facility] 07/05/2019, 3:55 PM Please see Amion for pager number during day hours 7:00am-4:30pm

## 2019-07-05 NOTE — ED Provider Notes (Signed)
[facility] DEPT Provider Note   CSN: [id number] Arrival date & time: 07/05/19  1338     History Chief Complaint  Patient presents with  . Abdominal Pain    Betty [name] is a 74 y.o. female.  HPI Patient presents with abdominal pain for around the last 3 days.  Right flank/abdominal pain.  Been worsening over that time.  No nausea vomiting diarrhea.  States it felt like it could be some gas.  States she is also had 2 bowel movements daily that did not help or really hurt the pain.  Is somewhat irregular with stool but that is unchanged.  No fevers.  She is postmenopausal.  She is not on blood thinners.    Past Medical History:  Diagnosis Date  . 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 High cholesterol     Patient Active Problem List   Diagnosis Date Noted  . Cataract 05/03/2019  . Osteopenia of left upper arm 05/03/2019  . Arthritis of left acromioclavicular joint 05/03/2019  . Chronic pain of both shoulders 05/03/2019  . 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  . Pulmonary embolism ([place]) 10/21/2018  . Gastroesophageal reflux disease 10/21/2018  . Hyperlipidemia 10/21/2018    Past Surgical History:  Procedure Laterality Date  . CESAREAN SECTION    . EYE SURGERY       OB History   No obstetric history on file.     Family History  Problem Relation Age of Onset  . Hypertension Mother   . Hypertension Father     Social History   Tobacco Use  . Smoking status: Never Smoker  . Smokeless tobacco: Never Used  Substance Use Topics  . Alcohol use: Never  . Drug use: Never    Home Medications Prior to Admission medications   Medication Sig Start Date End Date Taking? Authorizing Provider  acyclovir ointment (ZOVIRAX) 5 % Apply 1 application topically every 3 (three) hours. 02/28/19   Billie Ruddy, MD  atorvastatin (LIPITOR) 20 MG tablet Take 1 tablet (20 mg total) by mouth daily. 10/19/18   Billie Ruddy, MD  ELIQUIS 5 MG TABS tablet Take 1 tablet by mouth twice daily 06/26/19   Billie Ruddy, MD  omeprazole (PRILOSEC) 20 MG capsule Take 1 capsule by mouth once daily 05/05/19   Billie Ruddy, MD  traMADol (ULTRAM) 50 MG tablet Take 1 tablet (50 mg total) by mouth every 6 (six) hours as needed. 05/10/19   Hilts, Legrand Como, MD    Allergies    Sulfa antibiotics  Review of Systems   Review of Systems  Constitutional: Negative for appetite change, chills and fever.  HENT: Negative for congestion.   Respiratory: Negative for shortness of breath.   Cardiovascular: Negative for chest pain.  Gastrointestinal: Positive for abdominal pain. Negative for diarrhea, nausea and vomiting.  Genitourinary: Negative for flank pain.  Musculoskeletal: Negative for back pain.  Skin: Negative for rash.  Neurological: Negative for weakness.  Psychiatric/Behavioral: Negative for confusion.    Physical Exam Updated Vital Signs BP (!) 145/87 (BP Location: Left Arm)   Pulse 80   Temp 98.2 F (36.8 C) (Oral)   Resp 16   Ht 5\' 3"  (1.6 m)   Wt 64.9 kg   SpO2 100%   BMI 25.33 kg/m   Physical Exam Vitals and nursing note reviewed.  HENT:     Head: Normocephalic.  Pulmonary:     Effort: Pulmonary effort  is normal.     Breath sounds: Normal breath sounds.  Abdominal:     Tenderness: There is abdominal tenderness.     Hernia: No hernia is present.     Comments: right upper to mid abdominal tenderness.  No mass.  No rebound or guarding.  No hernia palpated.  Genitourinary:    Comments: No CVA tenderness. Skin:    General: Skin is warm.     Capillary Refill: Capillary refill takes less than 2 seconds.     Coloration: Skin is not jaundiced.  Neurological:     Mental Status: She is alert and oriented to person, place, and time.     ED Results / Procedures / Treatments   Labs (all labs ordered are listed, but only abnormal results are displayed) Labs Reviewed  CBC - Abnormal; Notable  for the following components:      Result Value   Hemoglobin 15.4 (*)    HCT 47.4 (*)    All other components within normal limits  URINALYSIS, ROUTINE W REFLEX MICROSCOPIC - Abnormal; Notable for the following components:   Leukocytes,Ua LARGE (*)    All other components within normal limits  SARS CORONAVIRUS 2 BY RT PCR (HOSPITAL ORDER, PERFORMED IN [facility])  LIPASE, BLOOD  COMPREHENSIVE METABOLIC PANEL    EKG None  Radiology CT ABDOMEN PELVIS W CONTRAST  Result Date: 07/05/2019 CLINICAL DATA:  Right lower quadrant pain, appendicitis suspected EXAM: CT ABDOMEN AND PELVIS WITH CONTRAST TECHNIQUE: Multidetector CT imaging of the abdomen and pelvis was performed using the standard protocol following bolus administration of intravenous contrast. CONTRAST:  OMNIPAQUE IOHEXOL 300 MG/ML  SOLN COMPARISON:  None. FINDINGS: Lower chest: Mixed area bandlike scarring and atelectasis in the left lung base. Lung bases otherwise clear. Normal heart size. No pericardial effusion. Hepatobiliary: Diffuse hepatic hypoattenuation compatible with hepatic steatosis. No focal liver abnormality is seen. No gallstones, gallbladder wall thickening, or biliary dilatation. Pancreas: Partial fatty replacement of the pancreas. No concerning pancreatic lesions or ductal dilatation. No peripancreatic inflammation. Spleen: Normal in size without focal abnormality. Adrenals/Urinary Tract: Adrenal glands are unremarkable. Kidneys are normal, without renal calculi, focal lesion, or hydronephrosis. Bladder is unremarkable. Stomach/Bowel: Distal esophagus, stomach and duodenal sweep are unremarkable. No small bowel wall thickening or dilatation. No evidence of obstruction. Focal with thickening of the mid to distal appendix with mucosal hyperemia and surrounding periappendiceal stranding and trace fluid possibly reactive. No extraluminal gas or organized collection. No colonic dilatation or wall thickening.  Scattered colonic diverticula without focal pericolonic inflammation to suggest diverticulitis. Vascular/Lymphatic: No significant vascular findings are present. No enlarged abdominal or pelvic lymph nodes. Reproductive: Normal appearance of the uterus and adnexal structures. Other: Trace fluid in the right pericolic gutter adjacent the appendix, as above. Musculoskeletal: Multilevel degenerative changes are present in the imaged portions of the spine. Corticated fragment adjacent the inferior aspect of the left pubic body, may reflect prior avulsion or enthesopathic change. IMPRESSION: 1. Findings compatible with acute appendicitis. Trace fluid in the adjacent pericolic gutter is likely reactive in the absence of other convincing signs of perforation. 2. Hepatic steatosis. 3. Colonic diverticulosis without evidence of diverticulitis. Electronically Signed   By: Kreg Shropshire M.D.   On: 07/05/2019 15:32    Procedures Procedures (including critical care time)  Medications Ordered in ED Medications  sodium chloride flush (NS) 0.9 % injection 3 mL (has no administration in time range)  sodium chloride (PF) 0.9 % injection (has no administration in  time range)  iohexol (OMNIPAQUE) 300 MG/ML solution 100 mL (100 mLs Intravenous Contrast Given 07/05/19 1500)    ED Course  I have reviewed the triage vital signs and the nursing notes.  Pertinent labs & imaging results that were available during my care of the patient were reviewed by me and considered in my medical decision making (see chart for details).    MDM Rules/Calculators/A&P                      Patient presents with abdominal pain for around 3 days.  Worsening pain.  No nausea vomiting diarrhea.  No fevers.  Lab work reassuring, however CT scan showed possible acute appendicitis.  With appendicitis will discuss with general surgery.  Has not eaten since around 8:00 this morning but has had liquid for the CT scan. General surgery will come and  see patient. Final Clinical Impression(s) / ED Diagnoses Final diagnoses:  Right lower quadrant abdominal pain  Acute appendicitis with localized peritonitis, without perforation, abscess, or gangrene    Rx / DC Orders ED Discharge Orders    None       Benjiman Core, MD 07/05/19 1559

## 2019-07-05 NOTE — H&P (View-Only) (Signed)
Please see note  Plan lap appy this admission.  Will wait >24 hours from holding Eliquis  The anatomy & physiology of the digestive tract was discussed.  The pathophysiology of appendicitis and other appendiceal disorders were discussed.  Natural history risks without surgery was discussed.   I feel the risks of no intervention will lead to serious problems that outweigh the operative risks; therefore, I recommended diagnostic laparoscopy with removal of appendix to remove the pathology.  Laparoscopic & open techniques were discussed.   I noted a good likelihood this will help address the problem.   Risks such as bleeding, infection, abscess, leak, reoperation, injury to other organs, need for repair of tissues / organs, possible ostomy, hernia, heart attack, stroke, death, and other risks were discussed.  Goals of post-operative recovery were discussed as well.  We will work to minimize complications.  Questions were answered.  The patient expresses understanding & wishes to proceed with surgery.  Ardeth Sportsman, MD, FACS, MASCRS Gastrointestinal and Minimally Invasive Surgery  [facility] [address], [address] [place], [place] [address] [phone] Fax [phone] Main/Paging  CONTACT INFORMATION: Weekday (9AM-5PM) concerns: Call [facility] main office at [phone] Weeknight (5PM-9AM) or Weekend/Holiday concerns: Check [email or web address] for General Surgery [facility] coverage (Please, do not use SecureChat as it is not reliable communication to surgeons for patient care)

## 2019-07-05 NOTE — ED Notes (Signed)
Transport called to bring pt to floor.  

## 2019-07-05 NOTE — Progress Notes (Signed)
Virtual Visit via Telephone Note  I connected with Betty [name] on 07/05/19 at 10:00 AM EDT by telephone and verified that I am speaking with the correct person using two identifiers.   I discussed the limitations, risks, security and privacy concerns of performing an evaluation and management service by telephone and the availability of in person appointments. I also discussed with the patient that there may be a patient responsible charge related to this service. The patient expressed understanding and agreed to proceed.  Location patient: home Location provider: work or home office Participants present for the call: patient, provider Patient did not have a visit in the prior 7 days to address this/these issue(s).   History of Present Illness: Pt is a 74 yo female with pmh sig for PE, GERD, HLD, osteopenia, arthritis, cataract, HSV who was contacted for acute concern.  Pt with lingering abdominal pain since Monday.  Starts around navel and travels laterally to R flank.  Pt notes the pain is achy, 6/10 pain, and keeps her up at night.  Pt thought if she should had a BM the pain would resolve, but it didn't.  Last BM this am.  Endorses h/o  constipation.  Notes stomach was growling and making increased noise.  Denies n/v, fever, hematochezia, changes in urine.  Does note drinking less water.   Observations/Objective: Patient sounds cheerful and well on the phone. I do not appreciate any SOB. Speech and thought processing are grossly intact. Pt endorses pain in abdomen with jumping up and down Patient reported vitals:  Assessment and Plan: Acute right lower quadrant pain -discussed possible causes including appendicitis, renal calculi, R sided diverticular dz, SBO less likely -discussed limitations of a visit via phone and need for in person exam, labs and likely imaging -pt to proceed to nearest ED for further eval.  Osteopenia of L upper arm -bone density ordered for future  Follow  Up Instructions: F/u prn  I did not refer this patient for an OV in the next 24 hours for this/these issue(s).  I discussed the assessment and treatment plan with the patient. The patient was provided an opportunity to ask questions and all were answered. The patient agreed with the plan and demonstrated an understanding of the instructions.   The patient was advised to call back or seek an in-person evaluation if the symptoms worsen or if the condition fails to improve as anticipated.  I provided 7:45 minutes of non-face-to-face time during this encounter.   Deeann Saint, MD

## 2019-07-05 NOTE — ED Triage Notes (Signed)
Patient c/o right mid abdominal pain x 3 days. Patient states pain progressively worsening. Patient denies any N/v/d.

## 2019-07-06 ENCOUNTER — Encounter (HOSPITAL_COMMUNITY)
Admission: EM | Disposition: A | Discharge status: Home / Self Care | Payer: Self-pay | Source: Home / Self Care | Attending: Emergency Medicine

## 2019-07-06 ENCOUNTER — Observation Stay (HOSPITAL_COMMUNITY): Payer: Medicare Other | Admitting: Anesthesiology

## 2019-07-06 ENCOUNTER — Encounter (HOSPITAL_COMMUNITY): Payer: Self-pay

## 2019-07-06 DIAGNOSIS — K3580 Unspecified acute appendicitis: Secondary | ICD-10-CM | POA: Diagnosis not present

## 2019-07-06 DIAGNOSIS — M19012 Primary osteoarthritis, left shoulder: Secondary | ICD-10-CM | POA: Diagnosis not present

## 2019-07-06 DIAGNOSIS — K219 Gastro-esophageal reflux disease without esophagitis: Secondary | ICD-10-CM | POA: Diagnosis not present

## 2019-07-06 DIAGNOSIS — K3533 Acute appendicitis with perforation and localized peritonitis, with abscess: Secondary | ICD-10-CM | POA: Diagnosis not present

## 2019-07-06 DIAGNOSIS — I2699 Other pulmonary embolism without acute cor pulmonale: Secondary | ICD-10-CM | POA: Diagnosis not present

## 2019-07-06 DIAGNOSIS — Z20822 Contact with and (suspected) exposure to covid-19: Secondary | ICD-10-CM | POA: Diagnosis not present

## 2019-07-06 DIAGNOSIS — E1136 Type 2 diabetes mellitus with diabetic cataract: Secondary | ICD-10-CM | POA: Diagnosis not present

## 2019-07-06 DIAGNOSIS — E785 Hyperlipidemia, unspecified: Secondary | ICD-10-CM | POA: Diagnosis not present

## 2019-07-06 HISTORY — PX: LAPAROSCOPIC APPENDECTOMY: SHX408

## 2019-07-06 LAB — CBC
HCT: 43.2 % (ref 36.0–46.0)
Hemoglobin: 14 g/dL (ref 12.0–15.0)
MCH: 30.2 pg (ref 26.0–34.0)
MCHC: 32.4 g/dL (ref 30.0–36.0)
MCV: 93.3 fL (ref 80.0–100.0)
Platelets: 171 10*3/uL (ref 150–400)
RBC: 4.63 MIL/uL (ref 3.87–5.11)
RDW: 12.3 % (ref 11.5–15.5)
WBC: 7 10*3/uL (ref 4.0–10.5)
nRBC: 0 % (ref 0.0–0.2)

## 2019-07-06 LAB — SURGICAL PCR SCREEN
MRSA, PCR: NEGATIVE
Staphylococcus aureus: NEGATIVE

## 2019-07-06 LAB — GLUCOSE, CAPILLARY: Glucose-Capillary: 116 mg/dL — ABNORMAL HIGH (ref 70–99)

## 2019-07-06 SURGERY — APPENDECTOMY, LAPAROSCOPIC
Anesthesia: General | Site: Abdomen

## 2019-07-06 MED ORDER — PHENYLEPHRINE 40 MCG/ML (10ML) SYRINGE FOR IV PUSH (FOR BLOOD PRESSURE SUPPORT)
PREFILLED_SYRINGE | INTRAVENOUS | Status: DC | PRN
  Administered 2019-07-06 (×4): 60 ug via INTRAVENOUS

## 2019-07-06 MED ORDER — ENALAPRILAT 1.25 MG/ML IV SOLN
0.6250 mg | Freq: Four times a day (QID) | INTRAVENOUS | Status: DC | PRN
  Filled 2019-07-06: qty 1

## 2019-07-06 MED ORDER — MAGIC MOUTHWASH
15.0000 mL | Freq: Four times a day (QID) | ORAL | Status: DC | PRN
  Filled 2019-07-06: qty 15

## 2019-07-06 MED ORDER — PANTOPRAZOLE SODIUM 40 MG PO TBEC
40.0000 mg | DELAYED_RELEASE_TABLET | Freq: Every day | ORAL | Status: DC
  Administered 2019-07-06 – 2019-07-07 (×2): 40 mg via ORAL
  Filled 2019-07-06: qty 1

## 2019-07-06 MED ORDER — ACETAMINOPHEN 500 MG PO TABS
1000.0000 mg | ORAL_TABLET | Freq: Three times a day (TID) | ORAL | Status: DC
  Administered 2019-07-07: 1000 mg via ORAL
  Filled 2019-07-06 (×3): qty 2

## 2019-07-06 MED ORDER — METHOCARBAMOL 500 MG PO TABS: 1000.0000 mg | ORAL_TABLET | Freq: Four times a day (QID) | ORAL | Status: DC | PRN

## 2019-07-06 MED ORDER — SODIUM CHLORIDE 0.9% FLUSH: 3.0000 mL | INTRAVENOUS | Status: DC | PRN

## 2019-07-06 MED ORDER — EPHEDRINE 5 MG/ML INJ
INTRAVENOUS | Status: AC
  Filled 2019-07-06: qty 10

## 2019-07-06 MED ORDER — ONDANSETRON HCL 4 MG/2ML IJ SOLN
INTRAMUSCULAR | Status: AC
  Filled 2019-07-06: qty 2

## 2019-07-06 MED ORDER — ADULT MULTIVITAMIN W/MINERALS CH
1.0000 | ORAL_TABLET | Freq: Every day | ORAL | Status: DC
  Administered 2019-07-06 – 2019-07-07 (×2): 1 via ORAL
  Filled 2019-07-06 (×2): qty 1

## 2019-07-06 MED ORDER — ESMOLOL HCL 100 MG/10ML IV SOLN
INTRAVENOUS | Status: DC | PRN
Start: 2019-07-06 — End: 2019-07-06
  Administered 2019-07-06: 10 mg via INTRAVENOUS
  Administered 2019-07-06: 20 mg via INTRAVENOUS
  Administered 2019-07-06: 10 mg via INTRAVENOUS

## 2019-07-06 MED ORDER — SODIUM CHLORIDE 0.9 % IV SOLN: 250.0000 mL | INTRAVENOUS | Status: DC | PRN

## 2019-07-06 MED ORDER — LACTATED RINGERS IV BOLUS: 1000.0000 mL | Freq: Three times a day (TID) | INTRAVENOUS | Status: DC | PRN

## 2019-07-06 MED ORDER — BUPIVACAINE HCL (PF) 0.25 % IJ SOLN
INTRAMUSCULAR | Status: DC | PRN
  Administered 2019-07-06: 30 mL

## 2019-07-06 MED ORDER — PHENYLEPHRINE 40 MCG/ML (10ML) SYRINGE FOR IV PUSH (FOR BLOOD PRESSURE SUPPORT)
PREFILLED_SYRINGE | INTRAVENOUS | Status: AC
  Filled 2019-07-06: qty 10

## 2019-07-06 MED ORDER — EPHEDRINE SULFATE-NACL 50-0.9 MG/10ML-% IV SOSY
PREFILLED_SYRINGE | INTRAVENOUS | Status: DC | PRN
  Administered 2019-07-06 (×2): 5 mg via INTRAVENOUS

## 2019-07-06 MED ORDER — DEXAMETHASONE SODIUM PHOSPHATE 10 MG/ML IJ SOLN
INTRAMUSCULAR | Status: DC | PRN
  Administered 2019-07-06: 4 mg via INTRAVENOUS

## 2019-07-06 MED ORDER — FENTANYL CITRATE (PF) 250 MCG/5ML IJ SOLN
INTRAMUSCULAR | Status: DC | PRN
  Administered 2019-07-06: 100 ug via INTRAVENOUS
  Administered 2019-07-06: 50 ug via INTRAVENOUS

## 2019-07-06 MED ORDER — FENTANYL CITRATE (PF) 100 MCG/2ML IJ SOLN: 25.0000 ug | INTRAMUSCULAR | Status: DC | PRN

## 2019-07-06 MED ORDER — LIDOCAINE 2% (20 MG/ML) 5 ML SYRINGE
INTRAMUSCULAR | Status: AC
  Filled 2019-07-06: qty 5

## 2019-07-06 MED ORDER — SIMETHICONE 40 MG/0.6ML PO SUSP
40.0000 mg | Freq: Four times a day (QID) | ORAL | Status: DC | PRN
  Filled 2019-07-06: qty 0.6

## 2019-07-06 MED ORDER — PHENOL 1.4 % MT LIQD
2.0000 | OROMUCOSAL | Status: DC | PRN
  Filled 2019-07-06: qty 177

## 2019-07-06 MED ORDER — BISACODYL 10 MG RE SUPP: 10.0000 mg | Freq: Two times a day (BID) | RECTAL | Status: DC | PRN

## 2019-07-06 MED ORDER — METHOCARBAMOL 1000 MG/10ML IJ SOLN
1000.0000 mg | Freq: Four times a day (QID) | INTRAVENOUS | Status: DC | PRN
  Filled 2019-07-06: qty 10

## 2019-07-06 MED ORDER — MENTHOL 3 MG MT LOZG: 1.0000 | LOZENGE | OROMUCOSAL | Status: DC | PRN

## 2019-07-06 MED ORDER — PROPOFOL 10 MG/ML IV BOLUS
INTRAVENOUS | Status: AC
  Filled 2019-07-06: qty 40

## 2019-07-06 MED ORDER — LIDOCAINE 2% (20 MG/ML) 5 ML SYRINGE
INTRAMUSCULAR | Status: DC | PRN
  Administered 2019-07-06: 40 mg via INTRAVENOUS

## 2019-07-06 MED ORDER — ROCURONIUM BROMIDE 10 MG/ML (PF) SYRINGE
PREFILLED_SYRINGE | INTRAVENOUS | Status: DC | PRN
  Administered 2019-07-06: 50 mg via INTRAVENOUS

## 2019-07-06 MED ORDER — PHENYLEPHRINE HCL (PRESSORS) 10 MG/ML IV SOLN
INTRAVENOUS | Status: AC
  Filled 2019-07-06: qty 1

## 2019-07-06 MED ORDER — PROCHLORPERAZINE EDISYLATE 10 MG/2ML IJ SOLN: 5.0000 mg | INTRAMUSCULAR | Status: DC | PRN

## 2019-07-06 MED ORDER — FENTANYL CITRATE (PF) 100 MCG/2ML IJ SOLN
INTRAMUSCULAR | Status: AC
  Filled 2019-07-06: qty 2

## 2019-07-06 MED ORDER — LACTATED RINGERS IV SOLN: INTRAVENOUS | Status: DC

## 2019-07-06 MED ORDER — DIFLUPREDNATE 0.05 % OP EMUL: 1.0000 [drp] | Freq: Every day | OPHTHALMIC | Status: DC

## 2019-07-06 MED ORDER — OXYCODONE HCL 5 MG/5ML PO SOLN: 5.0000 mg | Freq: Once | ORAL | Status: DC | PRN

## 2019-07-06 MED ORDER — VITAMIN C 500 MG PO TABS
500.0000 mg | ORAL_TABLET | Freq: Every day | ORAL | Status: DC
  Administered 2019-07-06: 500 mg via ORAL
  Filled 2019-07-06 (×3): qty 1

## 2019-07-06 MED ORDER — BUPIVACAINE HCL (PF) 0.25 % IJ SOLN
INTRAMUSCULAR | Status: AC
  Filled 2019-07-06: qty 30

## 2019-07-06 MED ORDER — TRAMADOL HCL 50 MG PO TABS: 50.0000 mg | ORAL_TABLET | Freq: Four times a day (QID) | ORAL | Status: DC | PRN

## 2019-07-06 MED ORDER — ATORVASTATIN CALCIUM 20 MG PO TABS
20.0000 mg | ORAL_TABLET | Freq: Every day | ORAL | Status: DC
  Administered 2019-07-06 – 2019-07-07 (×2): 20 mg via ORAL
  Filled 2019-07-06 (×2): qty 1

## 2019-07-06 MED ORDER — GABAPENTIN 300 MG PO CAPS
300.0000 mg | ORAL_CAPSULE | Freq: Two times a day (BID) | ORAL | Status: DC
  Administered 2019-07-06 – 2019-07-07 (×3): 300 mg via ORAL
  Filled 2019-07-06 (×3): qty 1

## 2019-07-06 MED ORDER — PROPOFOL 10 MG/ML IV BOLUS
INTRAVENOUS | Status: DC | PRN
  Administered 2019-07-06: 120 mg via INTRAVENOUS

## 2019-07-06 MED ORDER — ESMOLOL HCL 100 MG/10ML IV SOLN
INTRAVENOUS | Status: AC
  Filled 2019-07-06: qty 10

## 2019-07-06 MED ORDER — ONDANSETRON HCL 4 MG/2ML IJ SOLN
INTRAMUSCULAR | Status: DC | PRN
  Administered 2019-07-06: 4 mg via INTRAVENOUS

## 2019-07-06 MED ORDER — SODIUM CHLORIDE 0.9% FLUSH: 3.0000 mL | Freq: Two times a day (BID) | INTRAVENOUS | Status: DC

## 2019-07-06 MED ORDER — ALUM & MAG HYDROXIDE-SIMETH 200-200-20 MG/5ML PO SUSP: 30.0000 mL | Freq: Four times a day (QID) | ORAL | Status: DC | PRN

## 2019-07-06 MED ORDER — SUGAMMADEX SODIUM 200 MG/2ML IV SOLN
INTRAVENOUS | Status: DC | PRN
  Administered 2019-07-06: 140 mg via INTRAVENOUS

## 2019-07-06 MED ORDER — ONDANSETRON HCL 4 MG/2ML IJ SOLN: 4.0000 mg | Freq: Once | INTRAMUSCULAR | Status: DC | PRN

## 2019-07-06 MED ORDER — LACTATED RINGERS IR SOLN
Status: DC | PRN
  Administered 2019-07-06 (×2): 1000 mL

## 2019-07-06 MED ORDER — OXYCODONE HCL 5 MG PO TABS: 5.0000 mg | ORAL_TABLET | Freq: Once | ORAL | Status: DC | PRN

## 2019-07-06 MED ORDER — BUPIVACAINE LIPOSOME 1.3 % IJ SUSP
INTRAMUSCULAR | Status: DC | PRN
  Administered 2019-07-06: 20 mL

## 2019-07-06 MED ORDER — ROCURONIUM BROMIDE 10 MG/ML (PF) SYRINGE
PREFILLED_SYRINGE | INTRAVENOUS | Status: AC
  Filled 2019-07-06: qty 10

## 2019-07-06 MED ORDER — 0.9 % SODIUM CHLORIDE (POUR BTL) OPTIME
TOPICAL | Status: DC | PRN
  Administered 2019-07-06: 1000 mL

## 2019-07-06 MED ORDER — LIP MEDEX EX OINT
1.0000 "application " | TOPICAL_OINTMENT | Freq: Two times a day (BID) | CUTANEOUS | Status: DC
  Administered 2019-07-06 – 2019-07-07 (×2): 1 via TOPICAL
  Filled 2019-07-06: qty 7

## 2019-07-06 MED ORDER — DEXAMETHASONE SODIUM PHOSPHATE 10 MG/ML IJ SOLN
INTRAMUSCULAR | Status: AC
  Filled 2019-07-06: qty 1

## 2019-07-06 SURGICAL SUPPLY — 52 items
APPLIER CLIP 5 13 M/L LIGAMAX5 (MISCELLANEOUS)
APPLIER CLIP ROT 10 11.4 M/L (STAPLE)
CABLE HIGH FREQUENCY MONO STRZ (ELECTRODE) ×3 IMPLANT
CHLORAPREP W/TINT 26 (MISCELLANEOUS) ×3 IMPLANT
CLIP APPLIE 5 13 M/L LIGAMAX5 (MISCELLANEOUS) IMPLANT
CLIP APPLIE ROT 10 11.4 M/L (STAPLE) IMPLANT
COVER SURGICAL LIGHT HANDLE (MISCELLANEOUS) ×3 IMPLANT
COVER WAND RF STERILE (DRAPES) IMPLANT
CUTTER FLEX LINEAR 45M (STAPLE) IMPLANT
DEVICE TROCAR PUNCTURE CLOSURE (ENDOMECHANICALS) ×2 IMPLANT
DRAPE LAPAROSCOPIC ABDOMINAL (DRAPES) ×1 IMPLANT
DRAPE WARM FLUID 44X44 (DRAPES) ×3 IMPLANT
DRSG TEGADERM 2-3/8X2-3/4 SM (GAUZE/BANDAGES/DRESSINGS) ×6 IMPLANT
DRSG TEGADERM 4X4.75 (GAUZE/BANDAGES/DRESSINGS) ×3 IMPLANT
ELECT REM PT RETURN 15FT ADLT (MISCELLANEOUS) ×3 IMPLANT
ENDOLOOP SUT PDS II  0 18 (SUTURE)
ENDOLOOP SUT PDS II 0 18 (SUTURE) IMPLANT
GAUZE SPONGE 2X2 8PLY STRL LF (GAUZE/BANDAGES/DRESSINGS) ×1 IMPLANT
GLOVE ECLIPSE 8.0 STRL XLNG CF (GLOVE) ×3 IMPLANT
GLOVE INDICATOR 8.0 STRL GRN (GLOVE) ×3 IMPLANT
GOWN STRL REUS W/TWL XL LVL3 (GOWN DISPOSABLE) ×6 IMPLANT
IRRIG SUCT STRYKERFLOW 2 WTIP (MISCELLANEOUS) ×3
IRRIGATION SUCT STRKRFLW 2 WTP (MISCELLANEOUS) ×1 IMPLANT
KIT BASIN (CUSTOM PROCEDURE TRAY) ×3 IMPLANT
KIT TURNOVER KIT A (KITS) IMPLANT
PAD POSITIONING PINK XL (MISCELLANEOUS) ×3 IMPLANT
PENCIL SMOKE EVACUATOR (MISCELLANEOUS) IMPLANT
POUCH RETRIEVAL ECOSAC 10 (ENDOMECHANICALS) ×1 IMPLANT
POUCH RETRIEVAL ECOSAC 10MM (ENDOMECHANICALS) ×2
RELOAD 45 VASCULAR/THIN (ENDOMECHANICALS) IMPLANT
RELOAD STAPLE 45 2.5 WHT GRN (ENDOMECHANICALS) IMPLANT
RELOAD STAPLE 45 3.5 BLU ETS (ENDOMECHANICALS) IMPLANT
RELOAD STAPLE 60 3.6 BLU REG (STAPLE) IMPLANT
RELOAD STAPLE TA45 3.5 REG BLU (ENDOMECHANICALS) IMPLANT
RELOAD STAPLER BLUE 60MM (STAPLE) ×1 IMPLANT
SCISSORS LAP 5X35 DISP (ENDOMECHANICALS) ×3 IMPLANT
SET TUBE SMOKE EVAC HIGH FLOW (TUBING) ×3 IMPLANT
SHEARS HARMONIC ACE PLUS 36CM (ENDOMECHANICALS) IMPLANT
SLEEVE XCEL OPT CAN 5 100 (ENDOMECHANICALS) ×3 IMPLANT
SPONGE GAUZE 2X2 STER 10/PKG (GAUZE/BANDAGES/DRESSINGS) ×2
STAPLER ECHELON LONG 60 440 (INSTRUMENTS) ×2 IMPLANT
STAPLER RELOAD BLUE 60MM (STAPLE) ×3
SUT MNCRL AB 4-0 PS2 18 (SUTURE) ×3 IMPLANT
SUT PDS AB 0 CT1 36 (SUTURE) IMPLANT
SUT PDS AB 1 CT1 27 (SUTURE) IMPLANT
SUT SILK 2 0 SH (SUTURE) IMPLANT
TOWEL OR 17X26 10 PK STRL BLUE (TOWEL DISPOSABLE) ×3 IMPLANT
TRAY FOLEY MTR SLVR 14FR STAT (SET/KITS/TRAYS/PACK) ×2 IMPLANT
TRAY FOLEY MTR SLVR 16FR STAT (SET/KITS/TRAYS/PACK) IMPLANT
TRAY LAPAROSCOPIC (CUSTOM PROCEDURE TRAY) ×3 IMPLANT
TROCAR BLADELESS OPT 5 100 (ENDOMECHANICALS) ×3 IMPLANT
TROCAR XCEL 12X100 BLDLESS (ENDOMECHANICALS) ×3 IMPLANT

## 2019-07-06 NOTE — Transfer of Care (Signed)
Immediate Anesthesia Transfer of Care Note  Patient: Betty [name]  Procedure(s) Performed: APPENDECTOMY LAPAROSCOPIC, TAP BLOCK (N/A Abdomen)  Patient Location: PACU  Anesthesia Type:General  Level of Consciousness: drowsy  Airway & Oxygen Therapy: Patient Spontanous Breathing and Patient connected to face mask oxygen  Post-op Assessment: Report given to RN and Post -op Vital signs reviewed and stable  Post vital signs: Reviewed and stable  Last Vitals:  Vitals Value Taken Time  BP 162/88 07/06/19 0913  Temp    Pulse 94 07/06/19 0914  Resp 20 07/06/19 0914  SpO2 99 % 07/06/19 0914  Vitals shown include unvalidated device data.  Last Pain:  Vitals:   07/06/19 0657  TempSrc:   PainSc: 0-No pain      Patients Stated Pain Goal: 2 (07/05/19 2216)  Complications: No apparent anesthesia complications

## 2019-07-06 NOTE — Anesthesia Procedure Notes (Signed)
Procedure Name: Intubation Date/Time: 07/06/2019 7:53 AM Performed by: Eben Burow, CRNA Pre-anesthesia Checklist: Patient identified, Emergency Drugs available, Suction available, Patient being monitored and Timeout performed Patient Re-evaluated:Patient Re-evaluated prior to induction Oxygen Delivery Method: Circle system utilized Preoxygenation: Pre-oxygenation with 100% oxygen Induction Type: IV induction Ventilation: Mask ventilation without difficulty Laryngoscope Size: Mac and 4 Grade View: Grade I Tube type: Oral Tube size: 7.0 mm Number of attempts: 1 Airway Equipment and Method: Stylet Placement Confirmation: ETT inserted through vocal cords under direct vision,  positive ETCO2 and breath sounds checked- equal and bilateral Secured at: 21 cm Tube secured with: Tape Dental Injury: Teeth and Oropharynx as per pre-operative assessment

## 2019-07-06 NOTE — Progress Notes (Signed)
Report received from Jess RN/PACU, pt received to 1316 via stretcher and transferred to bed without difficulty.   Pt educated on use of call bell for needs/safety

## 2019-07-06 NOTE — Op Note (Signed)
PATIENT:  Betty [name]  74 y.o. female  Patient Care Team: Billie Ruddy, MD as PCP - General (Family Medicine)  PRE-OPERATIVE DIAGNOSIS:  ACUTE APPENDICITIS  POST-OPERATIVE DIAGNOSIS:  ACUTE APPENDICITIS with phlegmon  PROCEDURE:  APPENDECTOMY LAPAROSCOPIC TAP BLOCK  SURGEON:  Adin Hector, MD  ANESTHESIA:   local and general  EBL:  Total I/O In: 1837 [I.V.:1412; IV Piggyback:425] Out: 110 [Urine:100; Blood:10]  Delay start of Pharmacological VTE agent (>24hrs) due to surgical blood loss or risk of bleeding:  no  DRAINS: none   SPECIMEN:  APPENDIX  DISPOSITION OF SPECIMEN:  PATHOLOGY  COUNTS:  YES  PLAN OF CARE: Admit to inpatient   PATIENT DISPOSITION:  PACU - hemodynamically stable.   INDICATIONS: Patient with concerning symptoms & work up suspicious for appendicitis.  Surgery was recommended:  The anatomy & physiology of the digestive tract was discussed.  The pathophysiology of appendicitis was discussed.  Natural history risks without surgery was discussed.   I feel the risks of no intervention will lead to serious problems that outweigh the operative risks; therefore, I recommended diagnostic laparoscopy with removal of appendix to remove the pathology.  Laparoscopic & open techniques were discussed.   I noted a good likelihood this will help address the problem.    Risks such as bleeding, infection, abscess, leak, reoperation, possible ostomy, hernia, heart attack, death, and other risks were discussed.  Goals of post-operative recovery were discussed as well.  We will work to minimize complications.  Questions were answered.  The patient expresses understanding & wishes to proceed with surgery.  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  Had worsening abdominal pain.  Persisted.  Came emergency room.  Exam and CT scan concerning for appendicitis.  Admitted.  Last dose of Eliquis yesterday.  I  recommended laparoscopic exploration and probable appendectomy.  CASE DATA:  Type of patient?: LDOW CASE (Surgical Hospitalist [facility] Inpatient)  Status of Case? URGENT Add On  Infection Present At Time Of Surgery (PATOS)?  PHLEGMON  DESCRIPTION:   Informed consent was confirmed.  The patient underwent general anaesthesia without difficulty.  The patient was positioned appropriately.  VTE prevention in place.  The patient's abdomen was clipped, prepped, & draped in a sterile fashion.  Surgical timeout confirmed our plan.  Peritoneal entry with a laparoscopic port was obtained using optical entry technique in the left upper abdomen as the patient was positioned in reverse Trendelenburg.  Entry was clean.  I induced carbon dioxide insufflation.  Camera inspection revealed no injury.  Patient had no adhesions to the anterior bowel wall.  I placed extra ports periumbilically and suprapubically hidden in her prior low midline incision under direct laparoscopic visualization.  I mobilized the terminal ileum to proximal ascending colon in a lateral to medial fashion.  I took care to avoid injuring any retroperitoneal structures.  She had an obvious inflamed thickened appendix with phlegmon.  Suspicious for contained perforation.  Definite inflammation adhesion to the Gerota's fascia on the right kidney.  Eventually able to free it off the retroperitoneum. I freed the appendix off its attachments to the ascending colon and cecal mesentery.  I elevated the appendix. I skeletonized the mesoappendix. I was able to free off the base of the appendix which was still viable.  I stapled the appendix off the cecum using a laparoscopic stapler. I took a healthy cuff of viable cecum. I ligated the mesoappendix and assured hemostasis in the mesentery.  I placed the appendix inside an EcoSac bag and removed out the 3mm stapler port.  I did copious irrigation.  I took care to ensure hemostasis on the retroperitoneum and  mesoappendix.  Ureter was deep to all this and the duodenum was not involved.  Stayed away from that.  Hemostasis was good in the mesoappendix, colon mesentery, and retroperitoneum. Staple line was intact on the cecum with no bleeding. I washed out the pelvis, retrohepatic space and right paracolic gutter. I washed out the left side as well.  I ran the small bowel proximally and confirmed no interloop abscess Meckel's diverticulum interloop adhesions or other concerns.  No evidence of any ileitis.  Hemostasis is good. There was no perforation or injury. Because the area cleaned up well after irrigation, I did not place a drain.  I closed the 12 mm stapler port site with a 0 Vicryl stitch using a suture passer under direct laparoscopic visualization.   Did reinspection 1 more time to confirm good hemostasis given her history of being on anticoagulation.  I aspirated the carbon dioxide. I removed the ports.  I closed skin using 4-0 monocryl stitch.  Sterile dressings applied.  Patient was extubated and sent to the recovery room.  I called the daughter's mobile phone.  I left a message per instructions..  Given the phlegmon and some focal peritonitis, I suspect the patient is going used in the hospital at least overnight and will need antibiotics for 5 days.  Ardeth Sportsman, M.D., F.A.C.S. Gastrointestinal and Minimally Invasive Surgery [facility] [address], [address] [place], [place] [address] [phone] Main / Paging  07/06/2019 9:17 AM

## 2019-07-06 NOTE — Discharge Instructions (Signed)
SURGERY: POST OP INSTRUCTIONS °(Surgery for small bowel obstruction, colon resection, etc) ° ° °###################################################################### ° °EAT °Gradually transition to a high fiber diet with a fiber supplement over the next few days after discharge ° °WALK °Walk an hour a day.  Control your pain to do that.   ° °CONTROL PAIN °Control pain so that you can walk, sleep, tolerate sneezing/coughing, go up/down stairs. ° °HAVE A BOWEL MOVEMENT DAILY °Keep your bowels regular to avoid problems.  OK to try a laxative to override constipation.  OK to use an antidairrheal to slow down diarrhea.  Call if not better after 2 tries ° °CALL IF YOU HAVE PROBLEMS/CONCERNS °Call if you are still struggling despite following these instructions. °Call if you have concerns not answered by these instructions ° °###################################################################### ° ° °DIET °Follow a light diet the first few days at home.  Start with a bland diet such as soups, liquids, starchy foods, low fat foods, etc.  If you feel full, bloated, or constipated, stay on a ful liquid or pureed/blenderized diet for a few days until you feel better and no longer constipated. °Be sure to drink plenty of fluids every day to avoid getting dehydrated (feeling dizzy, not urinating, etc.). °Gradually add a fiber supplement to your diet over the next week.  Gradually get back to a regular solid diet.  Avoid fast food or heavy meals the first week as you are more likely to get nauseated. °It is expected for your digestive tract to need a few months to get back to normal.  It is common for your bowel movements and stools to be irregular.  You will have occasional bloating and cramping that should eventually fade away.  Until you are eating solid food normally, off all pain medications, and back to regular activities; your bowels will not be normal. °Focus on eating a low-fat, high fiber diet the rest of your life  (See Getting to Good Bowel Health, below). ° °CARE of your INCISION or WOUND °It is good for closed incision and even open wounds to be washed every day.  Shower every day.  Short baths are fine.  Wash the incisions and wounds clean with soap & water.    °If you have a closed incision(s), wash the incision with soap & water every day.  You may leave closed incisions open to air if it is dry.   You may cover the incision with clean gauze & replace it after your daily shower for comfort. °If you have skin tapes (Steristrips) or skin glue (Dermabond) on your incision, leave them in place.  They will fall off on their own like a scab.  You may trim any edges that curl up with clean scissors.  If you have staples, set up an appointment for them to be removed in the office in 10 days after surgery.  °If you have a drain, wash around the skin exit site with soap & water and place a new dressing of gauze or band aid around the skin every day.  Keep the drain site clean & dry.    °If you have an open wound with packing, see wound care instructions.  In general, it is encouraged that you remove your dressing and packing, shower with soap & water, and replace your dressing once a day.  Pack the wound with clean gauze moistened with normal (0.9%) saline to keep the wound moist & uninfected.  Pressure on the dressing for 30 minutes will stop most wound   bleeding.  Eventually your body will heal & pull the open wound closed over the next few months.  °Raw open wounds will occasionally bleed or secrete yellow drainage until it heals closed.  Drain sites will drain a little until the drain is removed.  Even closed incisions can have mild bleeding or drainage the first few days until the skin edges scab over & seal.   °If you have an open wound with a wound vac, see wound vac care instructions. ° ° ° ° °ACTIVITIES as tolerated °Start light daily activities --- self-care, walking, climbing stairs-- beginning the day after surgery.   Gradually increase activities as tolerated.  Control your pain to be active.  Stop when you are tired.  Ideally, walk several times a day, eventually an hour a day.   °Most people are back to most day-to-day activities in a few weeks.  It takes 4-8 weeks to get back to unrestricted, intense activity. °If you can walk 30 minutes without difficulty, it is safe to try more intense activity such as jogging, treadmill, bicycling, low-impact aerobics, swimming, etc. °Save the most intensive and strenuous activity for last (Usually 4-8 weeks after surgery) such as sit-ups, heavy lifting, contact sports, etc.  Refrain from any intense heavy lifting or straining until you are off narcotics for pain control.  You will have off days, but things should improve week-by-week. °DO NOT PUSH THROUGH PAIN.  Let pain be your guide: If it hurts to do something, don't do it.  Pain is your body warning you to avoid that activity for another week until the pain goes down. °You may drive when you are no longer taking narcotic prescription pain medication, you can comfortably wear a seatbelt, and you can safely make sudden turns/stops to protect yourself without hesitating due to pain. °You may have sexual intercourse when it is comfortable. If it hurts to do something, stop. ° °MEDICATIONS °Take your usually prescribed home medications unless otherwise directed.   °Blood thinners:  °Usually you can restart any strong blood thinners after the second postoperative day.  It is OK to take aspirin right away.    ° If you are on strong blood thinners (warfarin/Coumadin, Plavix, Xerelto, Eliquis, Pradaxa, etc), discuss with your surgeon, medicine PCP, and/or cardiologist for instructions on when to restart the blood thinner & if blood monitoring is needed (PT/INR blood check, etc).   ° ° °PAIN CONTROL °Pain after surgery or related to activity is often due to strain/injury to muscle, tendon, nerves and/or incisions.  This pain is usually  short-term and will improve in a few months.  °To help speed the process of healing and to get back to regular activity more quickly, DO THE FOLLOWING THINGS TOGETHER: °1. Increase activity gradually.  DO NOT PUSH THROUGH PAIN °2. Use Ice and/or Heat °3. Try Gentle Massage and/or Stretching °4. Take over the counter pain medication °5. Take Narcotic prescription pain medication for more severe pain ° °Good pain control = faster recovery.  It is better to take more medicine to be more active than to stay in bed all day to avoid medications. °1.  Increase activity gradually °Avoid heavy lifting at first, then increase to lifting as tolerated over the next 6 weeks. °Do not “push through” the pain.  Listen to your body and avoid positions and maneuvers than reproduce the pain.  Wait a few days before trying something more intense °Walking an hour a day is encouraged to help your body recover faster   and more safely.  Start slowly and stop when getting sore.  If you can walk 30 minutes without stopping or pain, you can try more intense activity (running, jogging, aerobics, cycling, swimming, treadmill, sex, sports, weightlifting, etc.) °Remember: If it hurts to do it, then don’t do it! °2. Use Ice and/or Heat °You will have swelling and bruising around the incisions.  This will take several weeks to resolve. °Ice packs or heating pads (6-8 times a day, 30-60 minutes at a time) will help sooth soreness & bruising. °Some people prefer to use ice alone, heat alone, or alternate between ice & heat.  Experiment and see what works best for you.  Consider trying ice for the first few days to help decrease swelling and bruising; then, switch to heat to help relax sore spots and speed recovery. °Shower every day.  Short baths are fine.  It feels good!  Keep the incisions and wounds clean with soap & water.   °3. Try Gentle Massage and/or Stretching °Massage at the area of pain many times a day °Stop if you feel pain - do not  overdo it °4. Take over the counter pain medication °This helps the muscle and nerve tissues become less irritable and calm down faster °Choose ONE of the following over-the-counter anti-inflammatory medications: °Acetaminophen 500mg tabs (Tylenol) 1-2 pills with every meal and just before bedtime (avoid if you have liver problems or if you have acetaminophen in you narcotic prescription) °Naproxen 220mg tabs (ex. Aleve, Naprosyn) 1-2 pills twice a day (avoid if you have kidney, stomach, IBD, or bleeding problems) °Ibuprofen 200mg tabs (ex. Advil, Motrin) 3-4 pills with every meal and just before bedtime (avoid if you have kidney, stomach, IBD, or bleeding problems) °Take with food/snack several times a day as directed for at least 2 weeks to help keep pain / soreness down & more manageable. °5. Take Narcotic prescription pain medication for more severe pain °A prescription for strong pain control is often given to you upon discharge (for example: oxycodone/Percocet, hydrocodone/Norco/Vicodin, or tramadol/Ultram) °Take your pain medication as prescribed. °Be mindful that most narcotic prescriptions contain Tylenol (acetaminophen) as well - avoid taking too much Tylenol. °If you are having problems/concerns with the prescription medicine (does not control pain, nausea, vomiting, rash, itching, etc.), please call us [phone] to see if we need to switch you to a different pain medicine that will work better for you and/or control your side effects better. °If you need a refill on your pain medication, you must call the office before 4 pm and on weekdays only.  By federal law, prescriptions for narcotics cannot be called into a pharmacy.  They must be filled out on paper & picked up from our office by the patient or authorized caretaker.  Prescriptions cannot be filled after 4 pm nor on weekends.   ° °WHEN TO CALL US [phone] °Severe uncontrolled or worsening pain  °Fever over 101 F (38.5 C) °Concerns with  the incision: Worsening pain, redness, rash/hives, swelling, bleeding, or drainage °Reactions / problems with new medications (itching, rash, hives, nausea, etc.) °Nausea and/or vomiting °Difficulty urinating °Difficulty breathing °Worsening fatigue, dizziness, lightheadedness, blurred vision °Other concerns °If you are not getting better after two weeks or are noticing you are getting worse, contact our office [phone] for further advice.  We may need to adjust your medications, re-evaluate you in the office, send you to the emergency room, or see what other things we can do to help. °The   clinic staff is available to answer your questions during regular business hours (8:30am-5pm).  Please don’t hesitate to call and ask to speak to one of our nurses for clinical concerns.    °A surgeon from [facility] is always on call at the hospitals 24 hours/day °If you have a medical emergency, go to the nearest emergency room or call 911. ° °FOLLOW UP in our office °One the day of your discharge from the hospital (or the next business weekday), please call [facility] to set up or confirm an appointment to see your surgeon in the office for a follow-up appointment.  Usually it is 2-3 weeks after your surgery.   °If you have skin staples at your incision(s), let the office know so we can set up a time in the office for the nurse to remove them (usually around 10 days after surgery). °Make sure that you call for appointments the day of discharge (or the next business weekday) from the hospital to ensure a convenient appointment time. °IF YOU HAVE DISABILITY OR FAMILY LEAVE FORMS, BRING THEM TO THE OFFICE FOR PROCESSING.  DO NOT GIVE THEM TO YOUR DOCTOR. ° °[facility] °[address], [address], [place], [place]  [address] ? °[phone] - Main °[phone] - Toll Free,  [phone] - Fax °[email or web address] ° °GETTING TO GOOD BOWEL HEALTH. °It is  expected for your digestive tract to need a few months to get back to normal.  It is common for your bowel movements and stools to be irregular.  You will have occasional bloating and cramping that should eventually fade away.  Until you are eating solid food normally, off all pain medications, and back to regular activities; your bowels will not be normal.   °Avoiding constipation °The goal: ONE SOFT BOWEL MOVEMENT A DAY!    °Drink plenty of fluids.  Choose water first. °TAKE A FIBER SUPPLEMENT EVERY DAY THE REST OF YOUR LIFE °During your first week back home, gradually add back a fiber supplement every day °Experiment which form you can tolerate.   There are many forms such as powders, tablets, wafers, gummies, etc °Psyllium bran (Metamucil), methylcellulose (Citrucel), Miralax or Glycolax, Benefiber, Flax Seed.  °Adjust the dose week-by-week (1/2 dose/day to 6 doses a day) until you are moving your bowels 1-2 times a day.  Cut back the dose or try a different fiber product if it is giving you problems such as diarrhea or bloating. °Sometimes a laxative is needed to help jump-start bowels if constipated until the fiber supplement can help regulate your bowels.  If you are tolerating eating & you are farting, it is okay to try a gentle laxative such as double dose MiraLax, prune juice, or Milk of Magnesia.  Avoid using laxatives too often. °Stool softeners can sometimes help counteract the constipating effects of narcotic pain medicines.  It can also cause diarrhea, so avoid using for too Beshears. °If you are still constipated despite taking fiber daily, eating solids, and a few doses of laxatives, call our office. °Controlling diarrhea °Try drinking liquids and eating bland foods for a few days to avoid stressing your intestines further. °Avoid dairy products (especially milk & ice cream) for a short time.  The intestines often can lose the ability to digest lactose when stressed. °Avoid foods that cause gassiness or  bloating.  Typical foods include beans and other legumes, cabbage, broccoli, and dairy foods.  Avoid greasy, spicy, fast foods.  Every person has   some sensitivity to other foods, so listen to your body and avoid those foods that trigger problems for you. °Probiotics (such as active yogurt, Align, etc) may help repopulate the intestines and colon with normal bacteria and calm down a sensitive digestive tract °Adding a fiber supplement gradually can help thicken stools by absorbing excess fluid and retrain the intestines to act more normally.  Slowly increase the dose over a few weeks.  Too much fiber too soon can backfire and cause cramping & bloating. °It is okay to try and slow down diarrhea with a few doses of antidiarrheal medicines.   °Bismuth subsalicylate (ex. Kayopectate, Pepto Bismol) for a few doses can help control diarrhea.  Avoid if pregnant.   °Loperamide (Imodium) can slow down diarrhea.  Start with one tablet (2mg) first.  Avoid if you are having fevers or severe pain.  °ILEOSTOMY PATIENTS WILL HAVE CHRONIC DIARRHEA since their colon is not in use.    °Drink plenty of liquids.  You will need to drink even more glasses of water/liquid a day to avoid getting dehydrated. °Record output from your ileostomy.  Expect to empty the bag every 3-4 hours at first.  Most people with a permanent ileostomy empty their bag 4-6 times at the least.   °Use antidiarrheal medicine (especially Imodium) several times a day to avoid getting dehydrated.  Start with a dose at bedtime & breakfast.  Adjust up or down as needed.  Increase antidiarrheal medications as directed to avoid emptying the bag more than 8 times a day (every 3 hours). °Work with your wound ostomy nurse to learn care for your ostomy.  See ostomy care instructions. °TROUBLESHOOTING IRREGULAR BOWELS °1) Start with a soft & bland diet. No spicy, greasy, or fried foods.  °2) Avoid gluten/wheat or dairy products from diet to see if symptoms improve. °3) Miralax  17gm or flax seed mixed in 8oz. water or juice-daily. May use 2-4 times a day as needed. °4) Gas-X, Phazyme, etc. as needed for gas & bloating.  °5) Prilosec (omeprazole) over-the-counter as needed °6)  Consider probiotics (Align, Activa, etc) to help calm the bowels down ° °Call your doctor if you are getting worse or not getting better.  Sometimes further testing (cultures, endoscopy, X-ray studies, CT scans, bloodwork, etc.) may be needed to help diagnose and treat the cause of the diarrhea. °[facility] °[address], [address], [place], [place]  [address] °[phone] - Main.    °[phone]  - Toll Free.   [phone] - Fax °[email or web address] ° ° °

## 2019-07-06 NOTE — Interval H&P Note (Signed)
History and Physical Interval Note:  07/06/2019 7:36 AM  Betty [name]  has presented today for surgery, with the diagnosis of ACUTE APPENDICITIS.  The various methods of treatment have been discussed with the patient and family. After consideration of risks, benefits and other options for treatment, the patient has consented to  Procedure(s): APPENDECTOMY LAPAROSCOPIC (N/A) as a surgical intervention.  The patient's history has been reviewed, patient examined, no change in status, stable for surgery.  I have reviewed the patient's chart and labs.  Questions were answered to the patient's satisfaction.    I have re-reviewed the the patient's records, history, medications, and allergies.  I have re-examined the patient.  I again discussed intraoperative plans and goals of post-operative recovery.  The patient agrees to proceed.  Toniann KetVIVIAN L Fedorchak  [birth year] [id number]  Patient Care Team: Deeann SaintBanks, Shannon R, MD as PCP - General (Family Medicine)  Patient Active Problem List   Diagnosis Date Noted   Acute appendicitis 07/05/2019   Chronic anticoagulation - Eliquis 07/05/2019   Noncompliance with medication treatment due to intermittent use of medication 07/05/2019   Diabetes mellitus without complication (HCC)    Cataract 05/03/2019   Osteopenia of left upper arm 05/03/2019   Arthritis of left acromioclavicular joint 05/03/2019   Chronic pain of both shoulders 05/03/2019   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   Gastroesophageal reflux disease 10/21/2018   Hyperlipidemia 10/21/2018   History of pulmonary embolus (PE) - Dx'd 05/2018  06/21/2018    Past Medical History:  Diagnosis Date   Diabetes mellitus without complication (HCC)    borderline   High cholesterol    History of pulmonary embolus (PE) - Dx'd 05/2018  06/21/2018    Past Surgical History:  Procedure Laterality Date   CESAREAN SECTION     EYE SURGERY      Social History   Socioeconomic History   Marital  status: Divorced    Spouse name: Not on file   Number of children: Not on file   Years of education: Not on file   Highest education level: Not on file  Occupational History   Occupation: retired    Associate Professormployer: [employer]  Tobacco Use   Smoking status: Never Smoker   Smokeless tobacco: Never Used  Substance and Sexual Activity   Alcohol use: Never   Drug use: Never   Sexual activity: Not on file  Other Topics Concern   Not on file  Social History Narrative   [place]   [place]   Social Determinants of Health   Financial Resource Strain:    Difficulty of Paying Living Expenses:   Food Insecurity:    Worried About Programme researcher, broadcasting/film/videounning Out of Food in the Last Year:    Baristaan Out of Food in the Last Year:   Transportation Needs:    Freight forwarderLack of Transportation (Medical):    Lack of Transportation (Non-Medical):   Physical Activity:    Days of Exercise per Week:    Minutes of Exercise per Session:   Stress:    Feeling of Stress :   Social Connections:    Frequency of Communication with Friends and Family:    Frequency of Social Gatherings with Friends and Family:    Attends Religious Services:    Active Member of Clubs or Organizations:    Attends BankerClub or Organization Meetings:    Marital Status:   Intimate Partner Violence:    Fear of Current or Ex-Partner:  Emotionally Abused:    Physically Abused:    Sexually Abused:     Family History  Problem Relation Age of Onset   Hypertension Mother    Hypertension Father     Medications Prior to Admission  Medication Sig Dispense Refill Last Dose   acetaminophen (TYLENOL) 325 MG tablet Take 650 mg by mouth every 6 (six) hours as needed for moderate pain.   07/05/2019 at Unknown time   Ascorbic Acid (VITAMIN C) 500 MG CAPS Take 500 capsules by mouth daily.   07/05/2019 at Unknown time   atorvastatin (LIPITOR) 20 MG tablet Take 1 tablet (20 mg total) by mouth daily. 90 tablet 3 07/04/2019 at Unknown time    Bromfenac Sodium (PROLENSA) 0.07 % SOLN Place 1 drop into the left eye daily.   07/05/2019 at Unknown time   cholecalciferol (VITAMIN D3) 25 MCG (1000 UNIT) tablet Take 1,000 Units by mouth daily.   07/05/2019 at Unknown time   Difluprednate (DUREZOL) 0.05 % EMUL Place 1 drop into the left eye daily.   07/04/2019 at Unknown time   ELIQUIS 5 MG TABS tablet Take 1 tablet by mouth twice daily (Patient taking differently: Take 5 mg by mouth 2 (two) times daily. ) 60 tablet 0 07/05/2019 at 8 am   Multiple Vitamin (MULTIVITAMIN WITH MINERALS) TABS tablet Take 1 tablet by mouth daily.   07/05/2019 at Unknown time   omeprazole (PRILOSEC) 20 MG capsule Take 1 capsule by mouth once daily (Patient taking differently: Take 20 mg by mouth daily. ) 30 capsule 1 07/05/2019 at Unknown time   acyclovir ointment (ZOVIRAX) 5 % Apply 1 application topically every 3 (three) hours. (Patient not taking: Reported on 07/05/2019) 15 g 1 Not Taking at Unknown time   traMADol (ULTRAM) 50 MG tablet Take 1 tablet (50 mg total) by mouth every 6 (six) hours as needed. (Patient not taking: Reported on 07/05/2019) 10 tablet 0 Not Taking at Unknown time    Current Facility-Administered Medications  Medication Dose Route Frequency Provider Last Rate Last Admin   [MAR Hold] acetaminophen (TYLENOL) tablet 650 mg  650 mg Oral Q6H PRN Sherrie George, PA-C   650 mg at 07/05/19 2216   Or   [MAR Hold] acetaminophen (TYLENOL) suppository 650 mg  650 mg Rectal Q6H PRN Sherrie George, PA-C       [facility] Hold] bupivacaine liposome (EXPAREL) 1.3 % injection 266 mg  20 mL Infiltration On Call to OR Karie Soda, MD       [facility] Hold] cefoTEtan (CEFOTAN) 2 g in sodium chloride 0.9 % 100 mL IVPB  2 g Intravenous On Call to OR Karie Soda, MD       [facility] Hold] cefTRIAXone (ROCEPHIN) 2 g in sodium chloride 0.9 % 100 mL IVPB  2 g Intravenous Q24H Karie Soda, MD   Stopped at 07/05/19 2253   [MAR Hold] diphenhydrAMINE (BENADRYL) 12.5 MG/5ML elixir 12.5 mg   12.5 mg Oral Q6H PRN Sherrie George, PA-C       Or   [MAR Hold] diphenhydrAMINE (BENADRYL) injection 12.5 mg  12.5 mg Intravenous Q6H PRN Sherrie George, PA-C       [facility] Hold] famotidine (PEPCID) IVPB 20 mg premix  20 mg Intravenous Q12H Sherrie George, PA-C   Stopped at 07/05/19 2339   [facility] Hold] lactated ringers bolus 1,000 mL  1,000 mL Intravenous Q8H PRN Karie Soda, MD       lactated ringers infusion   Intravenous Continuous Sherrie George, PA-C   Stopped  at 07/06/19 0546   lactated ringers infusion   Intravenous Continuous Michael Boston, MD 10 mL/hr at 07/06/19 0706 New Bag at 07/06/19 0706   [MAR Hold] metroNIDAZOLE (FLAGYL) IVPB 500 mg  500 mg Intravenous Tor Netters, MD 100 mL/hr at 07/06/19 0546 500 mg at 07/06/19 0546   [MAR Hold] morphine 2 MG/ML injection 1-3 mg  1-3 mg Intravenous Q2H PRN Earnstine Regal, PA-C       [facility] Hold] ondansetron (ZOFRAN-ODT) disintegrating tablet 4 mg  4 mg Oral Q6H PRN Earnstine Regal, PA-C       Or   [MAR Hold] ondansetron [facility]) injection 4 mg  4 mg Intravenous Q6H PRN Earnstine Regal, PA-C       [facility] Hold] oxyCODONE (Oxy IR/ROXICODONE) immediate release tablet 5-10 mg  5-10 mg Oral Q4H PRN Earnstine Regal, PA-C       [facility] Hold] scopolamine (TRANSDERM-SCOP) 1 MG/3DAYS 1.5 mg  1 patch Transdermal On Call to OR Michael Boston, MD   1.5 mg at 07/06/19 0651   [MAR Hold] sodium chloride flush (NS) 0.9 % injection 3 mL  3 mL Intravenous Once Davonna Belling, MD         Allergies  Allergen Reactions   Sulfa Antibiotics Other (See Comments)    bristers    BP (!) 157/95 (BP Location: Right Arm)   Pulse 78   Temp 98.3 F (36.8 C) (Oral)   Resp 18   Ht 5\' 3"  (1.6 m)   Wt 64.9 kg   SpO2 99%   BMI 25.33 kg/m   Labs: Results for orders placed or performed during the hospital encounter of 07/05/19 (from the past 48 hour(s))  Lipase, blood     Status: None   Collection Time: 07/05/19  2:07 PM  Result Value Ref Range    Lipase 25 11 - 51 U/L    Comment: Performed at [facility], [place] [address] [place], [place] [address]  Comprehensive metabolic panel     Status: None   Collection Time: 07/05/19  2:07 PM  Result Value Ref Range   Sodium 144 135 - 145 mmol/L   Potassium 4.4 3.5 - 5.1 mmol/L   Chloride 109 98 - 111 mmol/L   CO2 27 22 - 32 mmol/L   Glucose, Bld 78 70 - 99 mg/dL    Comment: Glucose reference range applies only to samples taken after fasting for at least 8 hours.   BUN 8 8 - 23 mg/dL   Creatinine, Ser 0.77 0.44 - 1.00 mg/dL   Calcium 9.5 8.9 - 10.3 mg/dL   Total Protein 7.7 6.5 - 8.1 g/dL   Albumin 4.6 3.5 - 5.0 g/dL   AST 25 15 - 41 U/L   ALT 23 0 - 44 U/L   Alkaline Phosphatase 63 38 - 126 U/L   Total Bilirubin 1.1 0.3 - 1.2 mg/dL   GFR calc non Af Amer >60 >60 mL/min   GFR calc Af Amer >60 >60 mL/min   Anion gap 8 5 - 15    Comment: Performed at [facility], [place] [address], [place], [place] [address]  CBC     Status: Abnormal   Collection Time: 07/05/19  2:07 PM  Result Value Ref Range   WBC 9.1 4.0 - 10.5 K/uL   RBC 5.01 3.87 - 5.11 MIL/uL   Hemoglobin 15.4 (H) 12.0 - 15.0 g/dL   HCT 47.4 (H) 36.0 - 46.0 %   MCV 94.6 80.0 - 100.0  fL   MCH 30.7 26.0 - 34.0 pg   MCHC 32.5 30.0 - 36.0 g/dL   RDW 30.8 65.7 - 84.6 %   Platelets 235 150 - 400 K/uL   nRBC 0.0 0.0 - 0.2 %    Comment: Performed at [facility], [address] [place], [place] [address]  Urinalysis, Routine w reflex microscopic     Status: Abnormal   Collection Time: 07/05/19  2:07 PM  Result Value Ref Range   Color, Urine YELLOW YELLOW   APPearance CLEAR CLEAR   Specific Gravity, Urine 1.018 1.005 - 1.030   pH 7.0 5.0 - 8.0   Glucose, UA NEGATIVE NEGATIVE mg/dL   Hgb urine dipstick NEGATIVE NEGATIVE   Bilirubin Urine NEGATIVE NEGATIVE   Ketones, ur NEGATIVE NEGATIVE mg/dL   Protein, ur NEGATIVE NEGATIVE mg/dL   Nitrite NEGATIVE NEGATIVE    Leukocytes,Ua LARGE (A) NEGATIVE   RBC / HPF 6-10 0 - 5 RBC/hpf   WBC, UA 6-10 0 - 5 WBC/hpf   Bacteria, UA NONE SEEN NONE SEEN   Squamous Epithelial / LPF 0-5 0 - 5   Mucus PRESENT     Comment: Performed at [facility], [address] [place], [place] [address]  Hemoglobin A1c     Status: Abnormal   Collection Time: 07/05/19  2:07 PM  Result Value Ref Range   Hgb A1c MFr Bld 5.7 (H) 4.8 - 5.6 %    Comment: (NOTE) Pre diabetes:          5.7%-6.4% Diabetes:              >6.4% Glycemic control for   <7.0% adults with diabetes    Mean Plasma Glucose 116.89 mg/dL    Comment: Performed at [facility], [address], [place], [place] [address]  SARS Coronavirus 2 by RT PCR (hospital order, performed in [facility] hospital lab) Nasopharyngeal Nasopharyngeal Swab     Status: None   Collection Time: 07/05/19  3:53 PM   Specimen: Nasopharyngeal Swab  Result Value Ref Range   SARS Coronavirus 2 NEGATIVE NEGATIVE    Comment: (NOTE) SARS-CoV-2 target nucleic acids are NOT DETECTED. The SARS-CoV-2 RNA is generally detectable in upper and lower respiratory specimens during the acute phase of infection. The lowest concentration of SARS-CoV-2 viral copies this assay can detect is 250 copies / mL. A negative result does not preclude SARS-CoV-2 infection and should not be used as the sole basis for treatment or other patient management decisions.  A negative result may occur with improper specimen collection / handling, submission of specimen other than nasopharyngeal swab, presence of viral mutation(s) within the areas targeted by this assay, and inadequate number of viral copies (<250 copies / mL). A negative result must be combined with clinical observations, patient history, and epidemiological information. Fact Sheet for Patients:   BoilerBrush.com.cy Fact Sheet for Healthcare Providers: https://pope.com/ This  test is not yet approved or cleared  by the [place] FDA and has been authorized for detection and/or diagnosis of SARS-CoV-2 by FDA under an Emergency Use Authorization (EUA).  This EUA will remain in effect (meaning this test can be used) for the duration of the COVID-19 declaration under Section 564(b)(1) of the Act, 21 U.S.C. section 360bbb-3(b)(1), unless the authorization is terminated or revoked sooner. Performed at [facility], [address] [place], [place] [address]   CBC     Status: None   Collection Time: 07/06/19  4:28 AM  Result Value Ref Range   WBC 7.0 4.0 - 10.5 K/uL   RBC 4.63 3.87 - 5.11 MIL/uL   Hemoglobin 14.0 12.0 - 15.0 g/dL   HCT 78.2 95.6 - 21.3 %   MCV 93.3 80.0 - 100.0 fL   MCH 30.2 26.0 - 34.0 pg   MCHC 32.4 30.0 - 36.0 g/dL   RDW 08.6 57.8 - 46.9 %   Platelets 171 150 - 400 K/uL   nRBC 0.0 0.0 - 0.2 %    Comment: Performed at [facility], [address] [place], [place] [address]  Surgical pcr screen     Status: None   Collection Time: 07/06/19  5:52 AM   Specimen: Nasal Mucosa; Nasal Swab  Result Value Ref Range   MRSA, PCR NEGATIVE NEGATIVE   Staphylococcus aureus NEGATIVE NEGATIVE    Comment: (NOTE) The Xpert SA Assay (FDA approved for NASAL specimens in patients 66 years of age and older), is one component of a comprehensive surveillance program. It is not intended to diagnose infection nor to guide or monitor treatment. Performed at [facility], [address] [place], [place] [address]     Imaging / Studies: DG Chest 2 View  Result Date: 07/05/2019 CLINICAL DATA:  Preop evaluation for upcoming appendectomy EXAM: CHEST - 2 VIEW COMPARISON:  None. FINDINGS: Cardiac shadow is within normal limits. Lungs are well aerated bilaterally. No focal infiltrate or effusion is seen. No acute bony abnormality is noted. IMPRESSION: No active cardiopulmonary disease. Electronically  Signed   By: Alcide Clever M.D.   On: 07/05/2019 18:42   CT ABDOMEN PELVIS W CONTRAST  Result Date: 07/05/2019 CLINICAL DATA:  Right lower quadrant pain, appendicitis suspected EXAM: CT ABDOMEN AND PELVIS WITH CONTRAST TECHNIQUE: Multidetector CT imaging of the abdomen and pelvis was performed using the standard protocol following bolus administration of intravenous contrast. CONTRAST:  OMNIPAQUE IOHEXOL 300 MG/ML  SOLN COMPARISON:  None. FINDINGS: Lower chest: Mixed area bandlike scarring and atelectasis in the left lung base. Lung bases otherwise clear. Normal heart size. No pericardial effusion. Hepatobiliary: Diffuse hepatic hypoattenuation compatible with hepatic steatosis. No focal liver abnormality is seen. No gallstones, gallbladder wall thickening, or biliary dilatation. Pancreas: Partial fatty replacement of the pancreas. No concerning pancreatic lesions or ductal dilatation. No peripancreatic inflammation. Spleen: Normal in size without focal abnormality. Adrenals/Urinary Tract: Adrenal glands are unremarkable. Kidneys are normal, without renal calculi, focal lesion, or hydronephrosis. Bladder is unremarkable. Stomach/Bowel: Distal esophagus, stomach and duodenal sweep are unremarkable. No small bowel wall thickening or dilatation. No evidence of obstruction. Focal with thickening of the mid to distal appendix with mucosal hyperemia and surrounding periappendiceal stranding and trace fluid possibly reactive. No extraluminal gas or organized collection. No colonic dilatation or wall thickening. Scattered colonic diverticula without focal pericolonic inflammation to suggest diverticulitis. Vascular/Lymphatic: No significant vascular findings are present. No enlarged abdominal or pelvic lymph nodes. Reproductive: Normal appearance of the uterus and adnexal structures. Other: Trace fluid in the right pericolic gutter adjacent the appendix, as above. Musculoskeletal: Multilevel degenerative changes  are present in the imaged portions of the spine. Corticated fragment adjacent the inferior aspect of the left pubic body, may reflect prior avulsion or enthesopathic change. IMPRESSION: 1. Findings compatible with acute appendicitis. Trace fluid in the adjacent pericolic gutter is likely reactive in the absence of other convincing signs of perforation. 2. Hepatic steatosis. 3. Colonic diverticulosis without evidence of diverticulitis. Electronically Signed   By: Samuella Cota  [facility] M.D.   On: 07/05/2019 15:32     .Ardeth Sportsman, M.D., F.A.C.S. Gastrointestinal and Minimally Invasive Surgery [facility] [address] [place], [place] [address] [phone] Main / Paging  07/06/2019 7:37 AM    Ardeth Sportsman

## 2019-07-06 NOTE — Anesthesia Postprocedure Evaluation (Signed)
Anesthesia Post Note  Patient: Brandice L Forstner  Procedure(s) Performed: APPENDECTOMY LAPAROSCOPIC, TAP BLOCK (N/A Abdomen)     Patient location during evaluation: PACU Anesthesia Type: General Level of consciousness: awake and alert Pain management: pain level controlled Vital Signs Assessment: post-procedure vital signs reviewed and stable Respiratory status: spontaneous breathing, nonlabored ventilation and respiratory function stable Cardiovascular status: blood pressure returned to baseline and stable Postop Assessment: no apparent nausea or vomiting Anesthetic complications: no    Last Vitals:  Vitals:   07/06/19 1158 07/06/19 1313  BP: 126/82 136/90  Pulse: 85 98  Resp: 17 16  Temp: 36.7 C 36.5 C  SpO2: 100% 99%    Last Pain:  Vitals:   07/06/19 1313  TempSrc: Oral  PainSc:                  Lucretia Kern

## 2019-07-06 NOTE — Plan of Care (Signed)
  Problem: Health Behavior/Discharge Planning: Goal: Ability to manage health-related needs will improve Outcome: Progressing   Problem: Clinical Measurements: Goal: Ability to maintain clinical measurements within normal limits will improve Outcome: Progressing Goal: Will remain free from infection Outcome: Progressing   Problem: Elimination: Goal: Will not experience complications related to urinary retention Outcome: Progressing   Problem: Pain Managment: Goal: General experience of comfort will improve Outcome: Progressing

## 2019-07-07 LAB — CBC
HCT: 41.7 % (ref 36.0–46.0)
Hemoglobin: 13.6 g/dL (ref 12.0–15.0)
MCH: 30.6 pg (ref 26.0–34.0)
MCHC: 32.6 g/dL (ref 30.0–36.0)
MCV: 93.9 fL (ref 80.0–100.0)
Platelets: 162 10*3/uL (ref 150–400)
RBC: 4.44 MIL/uL (ref 3.87–5.11)
RDW: 12.3 % (ref 11.5–15.5)
WBC: 11.2 10*3/uL — ABNORMAL HIGH (ref 4.0–10.5)
nRBC: 0 % (ref 0.0–0.2)

## 2019-07-07 LAB — BASIC METABOLIC PANEL
Anion gap: 7 (ref 5–15)
BUN: 7 mg/dL — ABNORMAL LOW (ref 8–23)
CO2: 26 mmol/L (ref 22–32)
Calcium: 9.2 mg/dL (ref 8.9–10.3)
Chloride: 108 mmol/L (ref 98–111)
Creatinine, Ser: 0.76 mg/dL (ref 0.44–1.00)
GFR calc Af Amer: >60 mL/min (ref >60)
GFR calc non Af Amer: >60 mL/min (ref >60)
Glucose, Bld: 100 mg/dL — ABNORMAL HIGH (ref 70–99)
Potassium: 4.8 mmol/L (ref 3.5–5.1)
Sodium: 141 mmol/L (ref 135–145)

## 2019-07-07 LAB — SURGICAL PATHOLOGY

## 2019-07-07 MED ORDER — AMOXICILLIN-POT CLAVULANATE 875-125 MG PO TABS
1.0000 | ORAL_TABLET | Freq: Two times a day (BID) | ORAL | 0 refills | Status: AC
Start: 2019-07-07 — End: 2019-07-11

## 2019-07-07 MED ORDER — ASCORBIC ACID 500 MG PO TABS
500.0000 mg | ORAL_TABLET | Freq: Every day | ORAL | Status: DC
  Administered 2019-07-07: 500 mg via ORAL
  Filled 2019-07-07: qty 1

## 2019-07-07 MED ORDER — ACETAMINOPHEN 500 MG PO TABS: 1000.0000 mg | ORAL_TABLET | Freq: Three times a day (TID) | ORAL | 0 refills | Status: DC | PRN

## 2019-07-07 MED ORDER — POLYETHYLENE GLYCOL 3350 17 G PO PACK: 17.0000 g | PACK | Freq: Every day | ORAL | 0 refills | Status: DC

## 2019-07-07 NOTE — Plan of Care (Signed)
  Problem: Health Behavior/Discharge Planning: Goal: Ability to manage health-related needs will improve Outcome: Progressing   Problem: Clinical Measurements: Goal: Ability to maintain clinical measurements within normal limits will improve Outcome: Progressing   Problem: Nutrition: Goal: Adequate nutrition will be maintained Outcome: Progressing   Problem: Pain Managment: Goal: General experience of comfort will improve Outcome: Progressing   Problem: Safety: Goal: Ability to remain free from injury will improve Outcome: Progressing

## 2019-07-07 NOTE — Discharge Summary (Signed)
[facility] Discharge Summary   Patient ID: Betty [name] MRN: [id number] DOB/AGE: [birth year] 74 y.o.  Admit date: 07/05/2019 Discharge date: 07/07/2019  Admitting Diagnosis: Acute appendicitis  Discharge Diagnosis Patient Active Problem List   Diagnosis Date Noted  . Acute phlegmonous appendicitis s/p lap appendectomy 07/06/2019 07/05/2019  . Chronic anticoagulation - Eliquis 07/05/2019  . Noncompliance with medication treatment due to intermittent use of medication 07/05/2019  . Diabetes mellitus without complication ([place])   . Cataract 05/03/2019  . Osteopenia of left upper arm 05/03/2019  . Arthritis of left acromioclavicular joint 05/03/2019  . Chronic pain of both shoulders 05/03/2019  . 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  . Gastroesophageal reflux disease 10/21/2018  . Hyperlipidemia 10/21/2018  . History of pulmonary embolus (PE) - Dx'd 05/2018  06/21/2018    Consultants None  Imaging: DG Chest 2 View  Result Date: 07/05/2019 CLINICAL DATA:  Preop evaluation for upcoming appendectomy EXAM: CHEST - 2 VIEW COMPARISON:  None. FINDINGS: Cardiac shadow is within normal limits. Lungs are well aerated bilaterally. No focal infiltrate or effusion is seen. No acute bony abnormality is noted. IMPRESSION: No active cardiopulmonary disease. Electronically Signed   By: Inez Catalina M.D.   On: 07/05/2019 18:42   CT ABDOMEN PELVIS W CONTRAST  Result Date: 07/05/2019 CLINICAL DATA:  Right lower quadrant pain, appendicitis suspected EXAM: CT ABDOMEN AND PELVIS WITH CONTRAST TECHNIQUE: Multidetector CT imaging of the abdomen and pelvis was performed using the standard protocol following bolus administration of intravenous contrast. CONTRAST:  180mL OMNIPAQUE IOHEXOL 300 MG/ML  SOLN COMPARISON:  None. FINDINGS: Lower chest: Mixed area bandlike scarring and atelectasis in the left lung base. Lung bases otherwise clear. Normal heart size. No pericardial effusion.  Hepatobiliary: Diffuse hepatic hypoattenuation compatible with hepatic steatosis. No focal liver abnormality is seen. No gallstones, gallbladder wall thickening, or biliary dilatation. Pancreas: Partial fatty replacement of the pancreas. No concerning pancreatic lesions or ductal dilatation. No peripancreatic inflammation. Spleen: Normal in size without focal abnormality. Adrenals/Urinary Tract: Adrenal glands are unremarkable. Kidneys are normal, without renal calculi, focal lesion, or hydronephrosis. Bladder is unremarkable. Stomach/Bowel: Distal esophagus, stomach and duodenal sweep are unremarkable. No small bowel wall thickening or dilatation. No evidence of obstruction. Focal with thickening of the mid to distal appendix with mucosal hyperemia and surrounding periappendiceal stranding and trace fluid possibly reactive. No extraluminal gas or organized collection. No colonic dilatation or wall thickening. Scattered colonic diverticula without focal pericolonic inflammation to suggest diverticulitis. Vascular/Lymphatic: No significant vascular findings are present. No enlarged abdominal or pelvic lymph nodes. Reproductive: Normal appearance of the uterus and adnexal structures. Other: Trace fluid in the right pericolic gutter adjacent the appendix, as above. Musculoskeletal: Multilevel degenerative changes are present in the imaged portions of the spine. Corticated fragment adjacent the inferior aspect of the left pubic body, may reflect prior avulsion or enthesopathic change. IMPRESSION: 1. Findings compatible with acute appendicitis. Trace fluid in the adjacent pericolic gutter is likely reactive in the absence of other convincing signs of perforation. 2. Hepatic steatosis. 3. Colonic diverticulosis without evidence of diverticulitis. Electronically Signed   By: Lovena Le M.D.   On: 07/05/2019 15:32    Procedures Dr. Johney Maine (07/06/2019) - Laparoscopic Appendectomy  Hospital Course:  CHRISTIA DOMKE Knauer is a  74yo female PMH hx PE on Eliquis who presented to [facility]12 with 3 days of worsening abdominal pain.  Workup showed acute appendicitis.  Patient was admitted and started on IV antibiotics, eliquis was held. On  5/13 she underwent procedure listed above.  Tolerated procedure well and was transferred to the floor.  Diet was advanced as tolerated.  Hemoglobin stable postoperatively. On POD1 the patient was voiding well, tolerating diet, ambulating well, pain well controlled, vital signs stable, incisions c/d/i and felt stable for discharge home.  Patient will follow up as below and knows to call with questions or concerns.     Physical Exam: General:  Alert, NAD, pleasant, comfortable Cardio: RRR Pulm: CTAB, rate and effort normal Abd:  Soft, ND, NT, +BS, multiple lap incisions with cdi dressings  Allergies as of 07/07/2019      Reactions   Sulfa Antibiotics Other (See Comments)   bristers      Medication List    STOP taking these medications   acyclovir ointment 5 % Commonly known as: Zovirax   traMADol 50 MG tablet Commonly known as: ULTRAM     TAKE these medications   acetaminophen 500 MG tablet Commonly known as: TYLENOL Take 2 tablets (1,000 mg total) by mouth every 8 (eight) hours as needed for mild pain. What changed:   medication strength  how much to take  when to take this  reasons to take this   amoxicillin-clavulanate 875-125 MG tablet Commonly known as: Augmentin Take 1 tablet by mouth 2 (two) times daily for 4 days.   atorvastatin 20 MG tablet Commonly known as: LIPITOR Take 1 tablet (20 mg total) by mouth daily.   cholecalciferol 25 MCG (1000 UNIT) tablet Commonly known as: VITAMIN D3 Take 1,000 Units by mouth daily.   Durezol 0.05 % Emul Generic drug: Difluprednate Place 1 drop into the left eye daily.   Eliquis 5 MG Tabs tablet Generic drug: apixaban Take 1 tablet by mouth twice daily What changed: how much to take   multivitamin with minerals  Tabs tablet Take 1 tablet by mouth daily.   omeprazole 20 MG capsule Commonly known as: PRILOSEC Take 1 capsule by mouth once daily   polyethylene glycol 17 g packet Commonly known as: MiraLax Take 17 g by mouth daily.   Prolensa 0.07 % Soln Generic drug: Bromfenac Sodium Place 1 drop into the left eye daily.   Vitamin C 500 MG Caps Take 500 capsules by mouth daily.        Follow-up Information    [facility] Follow up on 07/20/2019.   Specialty: General Surgery Why: Your appointment is at 2:30 PM.  Be at the office 30 minutes early for check in.  Bring photo ID and insurance information. Contact information: [address] [place] [address] [phone]           Signed: Franne Forts, [facility] 07/07/2019, 9:54 AM Please see Amion for pager number during day hours 7:00am-4:30pm

## 2019-08-02 ENCOUNTER — Telehealth (INDEPENDENT_AMBULATORY_CARE_PROVIDER_SITE_OTHER): Payer: Self-pay | Admitting: Family Medicine

## 2019-08-02 ENCOUNTER — Encounter: Payer: Self-pay | Admitting: Family Medicine

## 2019-08-02 DIAGNOSIS — Z86711 Personal history of pulmonary embolism: Secondary | ICD-10-CM

## 2019-08-02 DIAGNOSIS — Z9049 Acquired absence of other specified parts of digestive tract: Secondary | ICD-10-CM

## 2019-08-02 DIAGNOSIS — R7303 Prediabetes: Secondary | ICD-10-CM

## 2019-08-02 DIAGNOSIS — K649 Unspecified hemorrhoids: Secondary | ICD-10-CM

## 2019-08-02 NOTE — Progress Notes (Signed)
Virtual Visit via Telephone Note  I connected with Betty [name] on 08/02/19 at  4:30 PM EDT by telephone and verified that I am speaking with the correct person using two identifiers.   I discussed the limitations, risks, security and privacy concerns of performing an evaluation and management service by telephone and the availability of in person appointments. I also discussed with the patient that there may be a patient responsible charge related to this service. The patient expressed understanding and agreed to proceed.  Location patient: home Location provider: work or home office Participants present for the call: patient, provider Patient did not have a visit in the prior 7 days to address this/these issue(s).   History of Present Illness: Pt is a 74 yo female with pmh sig for DM II, GERD, Osteopenia, h/o PE on Eliquis, HLD, arthritis, HSV  Pt had phone visit 07/05/19 for abd pain, advised to proceed to the ED for further eval.  Pt had acute appendicitis s/p appendectomy 07/06/19.  Had f/u with gen surg.  Was having loose stools that are starting to improve.  At first was taking kaopectate,  Stools now more like normal.  Taking miralax qod.  Advancing diet as tolerated.  Trying to walk 30 mins/day.  Had hemorrhoids.  Using cream and wipes if needed.    Pt had a PE when in [place] over 2 yrs ago.  Pt on Eliquis ever since then.  Pt states she has only had one PE.  No records from previous providers ever sent.  Pt inquires about being prediabetic.    Observations/Objective: Patient sounds cheerful and well on the phone. I do not appreciate any SOB. Speech and thought processing are grossly intact. Patient reported vitals:  Assessment and Plan: Hemorrhoids, unspecified hemorrhoid type -Discussed diet modifications.  Advancing diet as tolerated 2/2 recent appendectomy -Continue MiraLAX as needed Continue OTC hemorrhoid cream/wipes. -For continued or worsening symptoms refer to  GI -Given precautions  History of appendectomy -stable -Continue to advance diet as tolerated -MiraLAX as needed for constipation  History of pulmonary embolus (PE)  -discussed need to obtain records from previous provider(s) -continue Eliquis for now  Prediabetes -hgb A1C 5.7% -Discussed lifestyle modifications including decreasing amount of sweets and carbohydrates you are eating. -We will continue to monitor   Follow Up Instructions: F/u for CPE in the next few months.  I did not refer this patient for an OV in the next 24 hours for this/these issue(s).  I discussed the assessment and treatment plan with the patient. The patient was provided an opportunity to ask questions and all were answered. The patient agreed with the plan and demonstrated an understanding of the instructions.   The patient was advised to call back or seek an in-person evaluation if the symptoms worsen or if the condition fails to improve as anticipated.  I provided 13:45 minutes of non-face-to-face time during this encounter.   Deeann Saint, MD

## 2019-08-17 ENCOUNTER — Ambulatory Visit: Payer: Medicare Other

## 2019-08-20 ENCOUNTER — Other Ambulatory Visit: Payer: Self-pay | Admitting: Family Medicine

## 2019-08-20 DIAGNOSIS — K219 Gastro-esophageal reflux disease without esophagitis: Secondary | ICD-10-CM

## 2019-09-14 ENCOUNTER — Other Ambulatory Visit: Payer: Self-pay | Admitting: Family Medicine

## 2019-09-14 DIAGNOSIS — I2699 Other pulmonary embolism without acute cor pulmonale: Secondary | ICD-10-CM

## 2019-09-18 DIAGNOSIS — Z1231 Encounter for screening mammogram for malignant neoplasm of breast: Secondary | ICD-10-CM | POA: Diagnosis not present

## 2019-09-18 LAB — HM MAMMOGRAPHY

## 2019-09-23 ENCOUNTER — Ambulatory Visit: Payer: Medicare Other | Attending: Internal Medicine

## 2019-09-23 DIAGNOSIS — Z23 Encounter for immunization: Secondary | ICD-10-CM

## 2019-09-23 NOTE — Progress Notes (Signed)
° °  Covid-19 Vaccination Clinic  Name:  NAFISA OLDS    MRN: [id number] DOB: Oct 08, 1945  09/23/2019  Ms. Gadison was observed post Covid-19 immunization for 15 minutes without incident. She was provided with Vaccine Information Sheet and instruction to access the V-Safe system.   Ms. LIMPERT was instructed to call 911 with any severe reactions post vaccine:  Difficulty breathing   Swelling of face and throat   A fast heartbeat   A bad rash all over body   Dizziness and weakness   Immunizations Administered    Name Date Dose VIS Date Route   Pfizer COVID-19 Vaccine 09/23/2019 11:02 AM 0.3 mL 04/19/2018 Intramuscular   Manufacturer: [employer], [employer]   Lot: [id number]   NDC: [address]-9

## 2019-09-29 ENCOUNTER — Encounter: Payer: Self-pay | Admitting: Family Medicine

## 2019-10-02 ENCOUNTER — Other Ambulatory Visit: Payer: Self-pay | Admitting: Family Medicine

## 2019-10-02 DIAGNOSIS — K219 Gastro-esophageal reflux disease without esophagitis: Secondary | ICD-10-CM

## 2019-10-19 ENCOUNTER — Other Ambulatory Visit: Payer: Self-pay

## 2019-10-20 ENCOUNTER — Ambulatory Visit (INDEPENDENT_AMBULATORY_CARE_PROVIDER_SITE_OTHER): Payer: Medicare Other | Admitting: Family Medicine

## 2019-10-20 ENCOUNTER — Encounter: Payer: Self-pay | Admitting: Family Medicine

## 2019-10-20 VITALS — BP 120/78 | HR 70 | Temp 98.7°F | Ht 63.0 in | Wt 145.0 lb

## 2019-10-20 DIAGNOSIS — Z0000 Encounter for general adult medical examination without abnormal findings: Secondary | ICD-10-CM | POA: Diagnosis not present

## 2019-10-20 DIAGNOSIS — M545 Low back pain, unspecified: Secondary | ICD-10-CM

## 2019-10-20 DIAGNOSIS — E785 Hyperlipidemia, unspecified: Secondary | ICD-10-CM | POA: Diagnosis not present

## 2019-10-20 DIAGNOSIS — R202 Paresthesia of skin: Secondary | ICD-10-CM

## 2019-10-20 DIAGNOSIS — R7303 Prediabetes: Secondary | ICD-10-CM | POA: Diagnosis not present

## 2019-10-20 DIAGNOSIS — R350 Frequency of micturition: Secondary | ICD-10-CM

## 2019-10-20 DIAGNOSIS — Z86711 Personal history of pulmonary embolism: Secondary | ICD-10-CM

## 2019-10-20 DIAGNOSIS — M791 Myalgia, unspecified site: Secondary | ICD-10-CM | POA: Diagnosis not present

## 2019-10-20 DIAGNOSIS — Z780 Asymptomatic menopausal state: Secondary | ICD-10-CM

## 2019-10-20 LAB — POCT URINALYSIS DIPSTICK
Bilirubin, UA: NEGATIVE
Blood, UA: NEGATIVE
Glucose, UA: NEGATIVE
Ketones, UA: NEGATIVE
Nitrite, UA: NEGATIVE
Protein, UA: NEGATIVE
Spec Grav, UA: 1.02 (ref 1.010–1.025)
Urobilinogen, UA: 0.2 E.U./dL
pH, UA: 6 (ref 5.0–8.0)

## 2019-10-20 NOTE — Patient Instructions (Signed)
Preventive Care 76 Years and Older, Female Preventive care refers to lifestyle choices and visits with your health care provider that can promote health and wellness. This includes:  A yearly physical exam. This is also called an annual well check.  Regular dental and eye exams.  Immunizations.  Screening for certain conditions.  Healthy lifestyle choices, such as diet and exercise. What can I expect for my preventive care visit? Physical exam Your health care provider will check:  Height and weight. These may be used to calculate body mass index (BMI), which is a measurement that tells if you are at a healthy weight.  Heart rate and blood pressure.  Your skin for abnormal spots. Counseling Your health care provider may ask you questions about:  Alcohol, tobacco, and drug use.  Emotional well-being.  Home and relationship well-being.  Sexual activity.  Eating habits.  History of falls.  Memory and ability to understand (cognition).  Work and work Statistician.  Pregnancy and menstrual history. What immunizations do I need?  Influenza (flu) vaccine  This is recommended every year. Tetanus, diphtheria, and pertussis (Tdap) vaccine  You may need a Td booster every 10 years. Varicella (chickenpox) vaccine  You may need this vaccine if you have not already been vaccinated. Zoster (shingles) vaccine  You may need this after age 33. Pneumococcal conjugate (PCV13) vaccine  One dose is recommended after age 33. Pneumococcal polysaccharide (PPSV23) vaccine  One dose is recommended after age 72. Measles, mumps, and rubella (MMR) vaccine  You may need at least one dose of MMR if you were born in 1957 or later. You may also need a second dose. Meningococcal conjugate (MenACWY) vaccine  You may need this if you have certain conditions. Hepatitis A vaccine  You may need this if you have certain conditions or if you travel or work in places where you may be exposed  to hepatitis A. Hepatitis B vaccine  You may need this if you have certain conditions or if you travel or work in places where you may be exposed to hepatitis B. Haemophilus influenzae type b (Hib) vaccine  You may need this if you have certain conditions. You may receive vaccines as individual doses or as more than one vaccine together in one shot (combination vaccines). Talk with your health care provider about the risks and benefits of combination vaccines. What tests do I need? Blood tests  Lipid and cholesterol levels. These may be checked every 5 years, or more frequently depending on your overall health.  Hepatitis C test.  Hepatitis B test. Screening  Lung cancer screening. You may have this screening every year starting at age 39 if you have a 30-pack-year history of smoking and currently smoke or have quit within the past 15 years.  Colorectal cancer screening. All adults should have this screening starting at age 36 and continuing until age 15. Your health care provider may recommend screening at age 23 if you are at increased risk. You will have tests every 1-10 years, depending on your results and the type of screening test.  Diabetes screening. This is done by checking your blood sugar (glucose) after you have not eaten for a while (fasting). You may have this done every 1-3 years.  Mammogram. This may be done every 1-2 years. Talk with your health care provider about how often you should have regular mammograms.  BRCA-related cancer screening. This may be done if you have a family history of breast, ovarian, tubal, or peritoneal cancers.  Other tests  Sexually transmitted disease (STD) testing.  Bone density scan. This is done to screen for osteoporosis. You may have this done starting at age 4. Follow these instructions at home: Eating and drinking  Eat a diet that includes fresh fruits and vegetables, whole grains, lean protein, and low-fat dairy products. Limit  your intake of foods with high amounts of sugar, saturated fats, and salt.  Take vitamin and mineral supplements as recommended by your health care provider.  Do not drink alcohol if your health care provider tells you not to drink.  If you drink alcohol: ? Limit how much you have to 0-1 drink a day. ? Be aware of how much alcohol is in your drink. In the [place] one drink equals one 12 oz bottle of beer (355 mL), one 5 oz glass of wine (148 mL), or one 1 oz glass of hard liquor (44 mL). Lifestyle  Take daily care of your teeth and gums.  Stay active. Exercise for at least 30 minutes on 5 or more days each week.  Do not use any products that contain nicotine or tobacco, such as cigarettes, e-cigarettes, and chewing tobacco. If you need help quitting, ask your health care provider.  If you are sexually active, practice safe sex. Use a condom or other form of protection in order to prevent STIs (sexually transmitted infections).  Talk with your health care provider about taking a low-dose aspirin or statin. What's next?  Go to your health care provider once a year for a well check visit.  Ask your health care provider how often you should have your eyes and teeth checked.  Stay up to date on all vaccines. This information is not intended to replace advice given to you by your health care provider. Make sure you discuss any questions you have with your health care provider. Document Revised: 02/03/2018 Document Reviewed: 02/03/2018 Elsevier Patient Education  [place].  Prediabetes Prediabetes is the condition of having a blood sugar (blood glucose) level that is higher than it should be, but not high enough for you to be diagnosed with type 2 diabetes. Having prediabetes puts you at risk for developing type 2 diabetes (type 2 diabetes mellitus). Prediabetes may be called impaired glucose tolerance or impaired fasting glucose. Prediabetes usually does not cause symptoms. Your  health care provider can diagnose this condition with blood tests. You may be tested for prediabetes if you are overweight and if you have at least one other risk factor for prediabetes. What is blood glucose, and how is it measured? Blood glucose refers to the amount of glucose in your bloodstream. Glucose comes from eating foods that contain sugars and starches (carbohydrates), which the body breaks down into glucose. Your blood glucose level may be measured in mg/dL (milligrams per deciliter) or mmol/L (millimoles per liter). Your blood glucose may be checked with one or more of the following blood tests:  A fasting blood glucose (FBG) test. You will not be allowed to eat (you will fast) for 8 hours or longer before a blood sample is taken. ? A normal range for FBG is 70-100 mg/dl (3.9-5.6 mmol/L).  An A1c (hemoglobin A1c) blood test. This test provides information about blood glucose control over the previous 2?76months.  An oral glucose tolerance test (OGTT). This test measures your blood glucose at two times: ? After fasting. This is your baseline level. ? Two hours after you drink a beverage that contains glucose. You may be diagnosed with  prediabetes:  If your FBG is 100?125 mg/dL (5.6-6.9 mmol/L).  If your A1c level is 5.7?6.4%.  If your OGTT result is 140?199 mg/dL (7.8-11 mmol/L). These blood tests may be repeated to confirm your diagnosis. How can this condition affect me? The pancreas produces a hormone (insulin) that helps to move glucose from the bloodstream into cells. When cells in the body do not respond properly to insulin that the body makes (insulin resistance), excess glucose builds up in the blood instead of going into cells. As a result, high blood glucose (hyperglycemia) can develop, which can cause many complications. Hyperglycemia is a symptom of prediabetes. Having high blood glucose for a [place] time is dangerous. Too much glucose in your blood can damage your nerves  and blood vessels. [place]-term damage can lead to complications from diabetes, which may include:  Heart disease.  Stroke.  Blindness.  Kidney disease.  Depression.  Poor circulation in the feet and legs, which could lead to surgical removal (amputation) in severe cases. What can increase my risk? Risk factors for prediabetes include:  Having a family member with type 2 diabetes.  Being overweight or obese.  Being older than age 22.  Being of American [place], African-American, Hispanic/Latino, or Asian/Pacific Islander descent.  Having an inactive (sedentary) lifestyle.  Having a history of heart disease.  History of gestational diabetes or polycystic ovary syndrome (PCOS), in women.  Having low levels of good cholesterol (HDL-C) or high levels of blood fats (triglycerides).  Having high blood pressure. What actions can I take to prevent diabetes?      Be physically active. ? Do moderate-intensity physical activity for 30 or more minutes on 5 or more days of the week, or as much as told by your health care provider. This could be brisk walking, biking, or water aerobics. ? Ask your health care provider what activities are safe for you. A mix of physical activities may be best, such as walking, swimming, cycling, and strength training.  Lose weight as told by your health care provider. ? Losing 5-7% of your body weight can reverse insulin resistance. ? Your health care provider can determine how much weight loss is best for you and can help you lose weight safely.  Follow a healthy meal plan. This includes eating lean proteins, complex carbohydrates, fresh fruits and vegetables, low-fat dairy products, and healthy fats. ? Follow instructions from your health care provider about eating or drinking restrictions. ? Make an appointment to see a diet and nutrition specialist (registered dietitian) to help you create a healthy eating plan that is right for you.  Do not smoke  or use any tobacco products, such as cigarettes, chewing tobacco, and e-cigarettes. If you need help quitting, ask your health care provider.  Take over-the-counter and prescription medicines as told by your health care provider. You may be prescribed medicines that help lower the risk of type 2 diabetes.  Keep all follow-up visits as told by your health care provider. This is important. Summary  Prediabetes is the condition of having a blood sugar (blood glucose) level that is higher than it should be, but not high enough for you to be diagnosed with type 2 diabetes.  Having prediabetes puts you at risk for developing type 2 diabetes (type 2 diabetes mellitus).  To help prevent type 2 diabetes, make lifestyle changes such as being physically active and eating a healthy diet. Lose weight as told by your health care provider. This information is not intended to replace  advice given to you by your health care provider. Make sure you discuss any questions you have with your health care provider. Document Revised: 06/03/2018 Document Reviewed: 04/02/2015 Elsevier Patient Education  [place].  Urinary Frequency, Adult Urinary frequency means urinating more often than usual. You may urinate every 1-2 hours even though you drink a normal amount of fluid and do not have a bladder infection or condition. Although you urinate more often than normal, the total amount of urine produced in a day is normal. With urinary frequency, you may have an urgent need to urinate often. The stress and anxiety of needing to find a bathroom quickly can make this urge worse. This condition may go away on its own or you may need treatment at home. Home treatment may include bladder training, exercises, taking medicines, or making changes to your diet. Follow these instructions at home: Bladder health   Keep a bladder diary if told by your health care provider. Keep track of: ? What you eat and drink. ? How  often you urinate. ? How much you urinate.  Follow a bladder training program if told by your health care provider. This may include: ? Learning to delay going to the bathroom. ? Double urinating (voiding). This helps if you are not completely emptying your bladder. ? Scheduled voiding.  Do Kegel exercises as told by your health care provider. Kegel exercises strengthen the muscles that help control urination, which may help the condition. Eating and drinking  If told by your health care provider, make diet changes, such as: ? Avoiding caffeine. ? Drinking fewer fluids, especially alcohol. ? Not drinking in the evening. ? Avoiding foods or drinks that may irritate the bladder. These include coffee, tea, soda, artificial sweeteners, citrus, tomato-based foods, and chocolate. ? Eating foods that help prevent or ease constipation. Constipation can make this condition worse. Your health care provider may recommend that you:  Drink enough fluid to keep your urine pale yellow.  Take over-the-counter or prescription medicines.  Eat foods that are high in fiber, such as beans, whole grains, and fresh fruits and vegetables.  Limit foods that are high in fat and processed sugars, such as fried or sweet foods. General instructions  Take over-the-counter and prescription medicines only as told by your health care provider.  Keep all follow-up visits as told by your health care provider. This is important. Contact a health care provider if:  You start urinating more often.  You feel pain or irritation when you urinate.  You notice blood in your urine.  Your urine looks cloudy.  You develop a fever.  You begin vomiting. Get help right away if:  You are unable to urinate. Summary  Urinary frequency means urinating more often than usual. With urinary frequency, you may urinate every 1-2 hours even though you drink a normal amount of fluid and do not have a bladder infection or other  bladder condition.  Your health care provider may recommend that you keep a bladder diary, follow a bladder training program, or make dietary changes.  If told by your health care provider, do Kegel exercises to strengthen the muscles that help control urination.  Take over-the-counter and prescription medicines only as told by your health care provider.  Contact a health care provider if your symptoms do not improve or get worse. This information is not intended to replace advice given to you by your health care provider. Make sure you discuss any questions you have with your  health care provider. Document Revised: 08/19/2017 Document Reviewed: 08/19/2017 Elsevier Patient Education  [place].  Statin Intolerance Statin intolerance is the inability to take a certain type of cholesterol-lowering medicine (statin) because of unwanted side effects, such as muscle pain. Statins may be prescribed to improve cholesterol levels and lower the risk for heart attack, heart disease, and stroke. People with statin intolerance may experience muscle pain and cramps (myalgia) that go away when the statin is stopped. What are the causes? The cause of this condition is not known. What increases the risk? You may be at higher risk for statin intolerance if you:  Take more than one type of cholesterol-lowering medicine at a time.  Need a higher than normal dosage of a statin.  Have a history of high CK (creatine kinase) in the blood. CK is an enzyme that is released when muscle tissue is damaged.  Are a woman.  Have a body size that is smaller than normal.  Are age 33 or older.  Drink a lot of alcohol.  Are of Asian descent.  Have kidney, liver, or muscle disease.  Have a low level of hormones that control how your body uses energy (hypothyroidism).  Take certain medicines, including: ? Certain medicines for mental illness (antipsychotics). ? Some types of antibiotics. ? Certain  medicines used for blood pressure or heart disease. ? Medicines that reduce the activity of the body's disease-fighting system (immunosuppressants). ? Medicines to treat hepatitis C and HIV/AIDS (human immunodeficiency virus/acquired immunodeficiency syndrome).  Follow an intense exercise program.  Drink a lot of grapefruit juice.  Have a lack of vitamin D (deficiency). What are the signs or symptoms? Signs and symptoms of statin intolerance include:  General muscle aches (myalgia). This may feel similar to muscle aches that are caused by the flu.  Muscle pain, tenderness, cramps, or weakness (myositis).  Severe muscle pain, weakness, and raised blood CK levels (rhabdomyolysis). Symptoms usually go away when the statin is stopped. Rarely, liver damage can also occur, which can cause:  Loss of appetite.  Pain in the upper right abdomen.  Yellowing of the skin or the white parts of the eyes (jaundice). How is this diagnosed? This condition is diagnosed based on your symptoms, your medical history, and a physical exam. You may also have blood tests. How is this treated? Your health care provider may have you stop taking the statin for a short time to see if your symptoms go away. Then your provider may restart your statin, or:  Change you to a different statin.  Lower the dosage of your statin.  Have you take your statin less often.  Change you to another type of cholesterol-lowering medicine.  Stop or change any medicines that might be interfering with your statin.  Limit how much grapefruit juice you drink.  Recommend stopping intense exercise. Follow these instructions at home: Medicines  Take your statin medicine as told by your health care provider. Do not stop taking the statin unless your health care provider tells you to stop.  Take other over-the-counter and prescription medicines only as told by your health care provider.  Check with your health care provider  before taking any new medicines. Certain medicines can increase your risk for statin intolerance. Lifestyle  Limit alcohol intake to no more than 1 drink a day for nonpregnant women and 2 drinks a day for men. One drink equals 12 oz of beer, 5 oz of wine, or 1 oz of hard liquor.  Do  not use any products that contain nicotine or tobacco, such as cigarettes and e-cigarettes. If you need help quitting, ask your health care provider. General instructions   Have blood tests to check CK levels or liver enzymes as told by your health care provider.  Exercise as directed. Ask your health care provider what exercises are best for you. Do not start a new exercise program before talking about it with your health care provider.  Follow instructions from your health care provider about eating or drinking restrictions. Your health care provider may recommend: ? Limiting the amount of grapefruit juice you drink, or not drinking it at all. ? Eating a diet that is low in saturated fats and high in fiber.  Maintain a healthy weight with diet and exercise.  Keep all follow-up visits as told by your health care provider. This is important. Contact a health care provider if:  You have any symptoms of statin intolerance. Summary  Statins are important medicines for improving your cholesterol, which may reduce your risk for heart attack and stroke.  Some people are not able to continue taking a particular statin because of muscle problems (myalgia) or other side effects.  Myalgia is the most common symptom of statin intolerance. Often, the muscle pain and cramps from myalgia go away when the statin is stopped.  Although rare, liver damage can occur as a result of statin intolerance. You should have routine blood tests to check your liver enzymes.  In most cases, statin intolerance can be managed and you can continue to take a cholesterol-lowering medicine. This information is not intended to replace  advice given to you by your health care provider. Make sure you discuss any questions you have with your health care provider. Document Revised: 01/22/2017 Document Reviewed: 07/23/2016 Elsevier Patient Education  [place].

## 2019-10-20 NOTE — Progress Notes (Signed)
Subjective:     Betty [name] is a 74 y.o. female and is here for a comprehensive physical exam.  Patient notes intermittent left flank pain x weeks. Soreness at night.  Tried heat, ice, ibuprofen.   Does not recall injury.  Also notes feeling of electrical shocks in her body that lasts seconds.  Sensation started 1 mo ago.    Pt inquires if she can stop Eliquis.  At one point was taking daily and went back to twice daily dosing.  Pt endorses h/o PE x1.  Pt previously signed records release form, however unclear if records sent from previous provider in [place].  Pt has been on Eliquis x 3 years.   Social History   Socioeconomic History  . Marital status: Divorced    Spouse name: Not on file  . Number of children: Not on file  . Years of education: Not on file  . Highest education level: Not on file  Occupational History  . Occupation: retired    Fish farm manager: [employer]  Tobacco Use  . Smoking status: Never Smoker  . Smokeless tobacco: Never Used  Vaping Use  . Vaping Use: Never used  Substance and Sexual Activity  . Alcohol use: Never  . Drug use: Never  . Sexual activity: Not on file  Other Topics Concern  . Not on file  Social History Narrative   Lived in [place] Aug [place]   Social Determinants of Health   Financial Resource Strain:   . Difficulty of Paying Living Expenses: Not on file  Food Insecurity:   . Worried About Charity fundraiser in the Last Year: Not on file  . Ran Out of Food in the Last Year: Not on file  Transportation Needs:   . Lack of Transportation (Medical): Not on file  . Lack of Transportation (Non-Medical): Not on file  Physical Activity:   . Days of Exercise per Week: Not on file  . Minutes of Exercise per Session: Not on file  Stress:   . Feeling of Stress : Not on file  Social Connections:   . Frequency of Communication with Friends and Family: Not on file  . Frequency of Social Gatherings with Friends and Family: Not on  file  . Attends Religious Services: Not on file  . Active Member of Clubs or Organizations: Not on file  . Attends Archivist Meetings: Not on file  . Marital Status: Not on file  Intimate Partner Violence:   . Fear of Current or Ex-Partner: Not on file  . Emotionally Abused: Not on file  . Physically Abused: Not on file  . Sexually Abused: Not on file   Health Maintenance  Topic Date Due  . Hepatitis C Screening  Never done  . FOOT EXAM  Never done  . OPHTHALMOLOGY EXAM  Never done  . URINE MICROALBUMIN  Never done  . TETANUS/TDAP  Never done  . COLONOSCOPY  Never done  . DEXA SCAN  Never done  . PNA vac Low Risk Adult (1 of 2 - PCV13) Never done  . COVID-19 Vaccine (2 - Pfizer 2-dose series) 10/14/2019  . INFLUENZA VACCINE  09/24/2019  . HEMOGLOBIN A1C  01/05/2020    The following portions of the patient's history were reviewed and updated as appropriate: allergies, current medications, past family history, past medical history, past social history, past surgical history and problem list.  Review of Systems Pertinent items noted in HPI and remainder  of comprehensive ROS otherwise negative.   Objective:    BP 120/78 (BP Location: Right Arm, Patient Position: Sitting, Cuff Size: Normal)   Pulse 70   Temp 98.7 F (37.1 C) (Oral)   Ht 5' 3"  (1.6 m)   Wt 145 lb (65.8 kg)   SpO2 98%   BMI 25.69 kg/m  General appearance: alert, cooperative and no distress Head: Normocephalic, without obvious abnormality, atraumatic Eyes: conjunctivae/corneas clear. PERRL, EOM's intact. Fundi benign. Ears: normal TM's and external ear canals both ears Nose: Nares normal. Septum midline. Mucosa normal. No drainage or sinus tenderness. Throat: lips, mucosa, and tongue normal; teeth and gums normal Neck: no adenopathy, no carotid bruit, no JVD, supple, symmetrical, trachea midline and thyroid not enlarged, symmetric, no tenderness/mass/nodules Lungs: clear to auscultation  bilaterally Heart: regular rate and rhythm, S1, S2 normal, no murmur, click, rub or gallop Abdomen: soft, non-tender; bowel sounds normal; no masses,  no organomegaly MSK:TTP of Lumbar spine.   extremities normal, atraumatic, no cyanosis or edema Pulses: 2+ and symmetric Skin: Skin color, texture, turgor normal. No rashes or lesions Lymph nodes: Cervical, supraclavicular, and axillary nodes normal. Neurologic: Alert and oriented X 3, normal strength and tone. Normal symmetric reflexes. Normal coordination and gait    Assessment:    Healthy female exam with paresthesia and L flank pain.     Plan:     Anticipatory guidance given including wearing seatbelts, smoke detectors in the home, increasing physical activity, increasing p.o. intake of water and vegetables. -will obtain labs. -mammogram done 09/18/19 -colonoscopy not indicated. -given handout -next CPE in 1 yr See After Visit Summary for Counseling Recommendations    Myalgia  -possibly statin induced  - Plan: CMP with eGFR([facility]), TSH, T4, free  Acute left-sided low back pain without sciatica  -likely 2/2 muscle strain - Plan: CBC (no diff), Culture, Urine, D-dimer  Hyperlipidemia, unspecified hyperlipidemia type  -lifestyle modifications -consider holding atorvostatin 20 mg - Plan: Lipid panel  Prediabetes  -hgb A1C 5.7% on 07/05/19 -lifestyle modifications encouraged - Plan: Microalbumin/Creatinine Ratio, Urine, POCT urinalysis dipstick, Hemoglobin A1c  Post-menopausal  - Plan: DG Bone Density  History of pulmonary embolus (PE)  - Plan: D-dimer, Quantitative  Urinary frequency  - Plan: POCT urinalysis dipstick, Culture, Urine  Paresthesia  - Plan: Vitamin B12, Folate, Folate, Vitamin B12  F/u in 1 month  Grier Mitts, MD

## 2019-10-21 LAB — LIPID PANEL
Cholesterol: 178 mg/dL (ref <200)
HDL: 55 mg/dL (ref >=50)
LDL Cholesterol (Calc): 93 mg/dL (calc)
Non-HDL Cholesterol (Calc): 123 mg/dL (calc) (ref <130)
Total CHOL/HDL Ratio: 3.2 (calc) (ref <5.0)
Triglycerides: 204 mg/dL — ABNORMAL HIGH (ref <150)

## 2019-10-21 LAB — COMPLETE METABOLIC PANEL WITH GFR
AG Ratio: 1.7 (calc) (ref 1.0–2.5)
ALT: 15 U/L (ref 6–29)
AST: 19 U/L (ref 10–35)
Albumin: 4.3 g/dL (ref 3.6–5.1)
Alkaline phosphatase (APISO): 54 U/L (ref 37–153)
BUN: 10 mg/dL (ref 7–25)
CO2: 29 mmol/L (ref 20–32)
Calcium: 9.5 mg/dL (ref 8.6–10.4)
Chloride: 105 mmol/L (ref 98–110)
Creat: 0.74 mg/dL (ref 0.60–0.93)
GFR, Est African American: 92 mL/min/{1.73_m2} (ref >=60)
GFR, Est Non African American: 79 mL/min/{1.73_m2} (ref >=60)
Globulin: 2.5 g/dL (calc) (ref 1.9–3.7)
Glucose, Bld: 66 mg/dL (ref 65–99)
Potassium: 4.1 mmol/L (ref 3.5–5.3)
Sodium: 140 mmol/L (ref 135–146)
Total Bilirubin: 0.9 mg/dL (ref 0.2–1.2)
Total Protein: 6.8 g/dL (ref 6.1–8.1)

## 2019-10-21 LAB — CBC
HCT: 43.6 % (ref 35.0–45.0)
Hemoglobin: 14.6 g/dL (ref 11.7–15.5)
MCH: 29.9 pg (ref 27.0–33.0)
MCHC: 33.5 g/dL (ref 32.0–36.0)
MCV: 89.3 fL (ref 80.0–100.0)
MPV: 12.2 fL (ref 7.5–12.5)
Platelets: 223 10*3/uL (ref 140–400)
RBC: 4.88 10*6/uL (ref 3.80–5.10)
RDW: 12.2 % (ref 11.0–15.0)
WBC: 5.7 10*3/uL (ref 3.8–10.8)

## 2019-10-21 LAB — T4, FREE: Free T4: 1 ng/dL (ref 0.8–1.8)

## 2019-10-21 LAB — URINE CULTURE
MICRO NUMBER:: 10882059
SPECIMEN QUALITY:: ADEQUATE

## 2019-10-21 LAB — VITAMIN B12: Vitamin B-12: 1038 pg/mL (ref 200–1100)

## 2019-10-21 LAB — FOLATE: Folate: 20.6 ng/mL

## 2019-10-21 LAB — HEMOGLOBIN A1C
Hgb A1c MFr Bld: 5.5 % of total Hgb (ref <5.7)
Mean Plasma Glucose: 111 (calc)
eAG (mmol/L): 6.2 (calc)

## 2019-10-21 LAB — TSH: TSH: 2.3 mIU/L (ref 0.40–4.50)

## 2019-10-21 LAB — D-DIMER, QUANTITATIVE: D-Dimer, Quant: 0.23 mcg/mL FEU (ref <0.50)

## 2019-10-24 ENCOUNTER — Ambulatory Visit: Payer: Medicare Other | Attending: Internal Medicine

## 2019-10-24 DIAGNOSIS — Z23 Encounter for immunization: Secondary | ICD-10-CM

## 2019-10-24 NOTE — Progress Notes (Signed)
   Covid-19 Vaccination Clinic  Name:  Betty [name]    MRN: [id number] DOB: 11-Oct-1945  10/24/2019  [name] was observed post Covid-19 immunization for 15 minutes without incident. She was provided with Vaccine Information Sheet and instruction to access the V-Safe system.   [name] was instructed to call 911 with any severe reactions post vaccine: Marland Kitchen Difficulty breathing  . Swelling of face and throat  . A fast heartbeat  . A bad rash all over body  . Dizziness and weakness   Immunizations Administered    Name Date Dose VIS Date Route   Pfizer COVID-19 Vaccine 10/24/2019  4:03 PM 0.3 mL 04/19/2018 Intramuscular   Manufacturer: [employer], [employer]   Lot: [id number]   NDC: [address]-6

## 2019-11-08 ENCOUNTER — Other Ambulatory Visit: Payer: Self-pay | Admitting: Family Medicine

## 2019-11-08 DIAGNOSIS — E2839 Other primary ovarian failure: Secondary | ICD-10-CM

## 2019-11-13 ENCOUNTER — Other Ambulatory Visit: Payer: Self-pay | Admitting: Family Medicine

## 2019-11-13 DIAGNOSIS — I2699 Other pulmonary embolism without acute cor pulmonale: Secondary | ICD-10-CM

## 2019-11-21 ENCOUNTER — Telehealth: Payer: Self-pay | Admitting: Family Medicine

## 2019-11-21 ENCOUNTER — Other Ambulatory Visit: Payer: Self-pay | Admitting: Family Medicine

## 2019-11-21 DIAGNOSIS — K219 Gastro-esophageal reflux disease without esophagitis: Secondary | ICD-10-CM

## 2019-11-21 NOTE — Telephone Encounter (Signed)
Spoke with patient to schedule AWV she did not respond to the call.

## 2019-12-13 DIAGNOSIS — H04123 Dry eye syndrome of bilateral lacrimal glands: Secondary | ICD-10-CM | POA: Diagnosis not present

## 2020-01-19 ENCOUNTER — Other Ambulatory Visit: Payer: Self-pay | Admitting: Family Medicine

## 2020-01-19 DIAGNOSIS — B009 Herpesviral infection, unspecified: Secondary | ICD-10-CM

## 2020-01-22 ENCOUNTER — Other Ambulatory Visit: Payer: Self-pay | Admitting: Family Medicine

## 2020-01-22 DIAGNOSIS — B009 Herpesviral infection, unspecified: Secondary | ICD-10-CM

## 2020-01-23 ENCOUNTER — Telehealth: Payer: Self-pay | Admitting: Family Medicine

## 2020-01-23 NOTE — Telephone Encounter (Signed)
Patient needs a refill on Acyclovir.  Patient has been having an outbreak since Thursday and has been waiting for the refill.[place]  Pharmacy- [facility] on [address].

## 2020-01-24 ENCOUNTER — Other Ambulatory Visit: Payer: Self-pay | Admitting: Family Medicine

## 2020-01-24 DIAGNOSIS — B009 Herpesviral infection, unspecified: Secondary | ICD-10-CM

## 2020-01-24 DIAGNOSIS — I2699 Other pulmonary embolism without acute cor pulmonale: Secondary | ICD-10-CM

## 2020-01-24 NOTE — Telephone Encounter (Signed)
Patient is calling to check the refill status acyclovir ointment (ZOVIRAX) 5 % and ELIQUIS 5 MG TABS tablet   sent to [facility] [address] [place]  [address] [place] [address]  Phone:  [phone] Fax:  [phone]  CB is [phone]

## 2020-02-06 ENCOUNTER — Ambulatory Visit
Admission: RE | Admit: 2020-02-06 | Discharge: 2020-02-06 | Disposition: A | Discharge status: Home / Self Care | Payer: Medicare Other | Source: Ambulatory Visit | Attending: Family Medicine | Admitting: Family Medicine

## 2020-02-06 ENCOUNTER — Other Ambulatory Visit: Payer: Self-pay

## 2020-02-06 DIAGNOSIS — Z780 Asymptomatic menopausal state: Secondary | ICD-10-CM | POA: Diagnosis not present

## 2020-02-06 DIAGNOSIS — M8588 Other specified disorders of bone density and structure, other site: Secondary | ICD-10-CM | POA: Diagnosis not present

## 2020-02-06 DIAGNOSIS — E2839 Other primary ovarian failure: Secondary | ICD-10-CM

## 2020-02-20 ENCOUNTER — Other Ambulatory Visit: Payer: Self-pay | Admitting: Family Medicine

## 2020-02-20 DIAGNOSIS — K219 Gastro-esophageal reflux disease without esophagitis: Secondary | ICD-10-CM

## 2020-02-21 ENCOUNTER — Telehealth: Payer: Self-pay | Admitting: Family Medicine

## 2020-02-21 ENCOUNTER — Other Ambulatory Visit: Payer: Self-pay | Admitting: Family Medicine

## 2020-02-21 DIAGNOSIS — M545 Low back pain, unspecified: Secondary | ICD-10-CM

## 2020-02-21 NOTE — Telephone Encounter (Signed)
Referral placed.

## 2020-02-21 NOTE — Telephone Encounter (Signed)
Patient is calling and wanted to see if the provider could put a referral to see an  Orthopedic Specialist, please advise. CB is [phone]

## 2020-02-21 NOTE — Telephone Encounter (Signed)
Ok for referral?

## 2020-02-22 NOTE — Telephone Encounter (Signed)
Left a detailed message at the pts cell number stating the referral was placed and someone will call with appt info.

## 2020-02-28 ENCOUNTER — Encounter: Payer: Self-pay | Admitting: Physician Assistant

## 2020-02-28 ENCOUNTER — Ambulatory Visit: Payer: Medicare Other | Admitting: Orthopaedic Surgery

## 2020-02-28 ENCOUNTER — Ambulatory Visit: Payer: Self-pay

## 2020-02-28 DIAGNOSIS — M25561 Pain in right knee: Secondary | ICD-10-CM | POA: Diagnosis not present

## 2020-02-28 DIAGNOSIS — M25562 Pain in left knee: Secondary | ICD-10-CM

## 2020-02-28 DIAGNOSIS — M79671 Pain in right foot: Secondary | ICD-10-CM

## 2020-02-28 NOTE — Progress Notes (Signed)
Office Visit Note   Patient: Betty [name]           Date of Birth: 09-04-1945           MRN: [id number] Visit Date: 02/28/2020              Requested by: Deeann Saint, MD [address] [place],  [place] [address] PCP: Deeann Saint, MD   Assessment & Plan: Visit Diagnoses:  1. Left knee pain, unspecified chronicity   2. Right knee pain, unspecified chronicity   3. Pain in right foot     Plan: Impression is bilateral knee advanced degenerative joint disease. We have discussed cortisone injections today and the patient would like to proceed with right knee only. She will follow up for left knee injection should her symptoms worsen. In regards to her right foot, there is no evidence of stress fracture on x-ray but I have recommended a rigid soled shoe to wear for the next few weeks. She will also avoid any exercises that seem to aggravate her symptoms. She will apply the Voltaren gel to the area as needed. She will follow up with [place] as needed.  Follow-Up Instructions: Return if symptoms worsen or fail to improve.   Orders:  Orders Placed This Encounter  Procedures  . Large Joint Inj: R knee  . XR KNEE 3 VIEW LEFT  . XR KNEE 3 VIEW RIGHT  . XR Foot Complete Right   No orders of the defined types were placed in this encounter.     Procedures: Large Joint Inj: R knee on 02/28/2020 9:56 AM Indications: pain Details: 22 G needle, anterolateral approach Medications: 2 mL lidocaine 1 %; 2 mL bupivacaine 0.25 %; 40 mg methylPREDNISolone acetate 40 MG/ML      Clinical Data: No additional findings.   Subjective: Chief Complaint  Patient presents with  . Right Knee - Pain  . Left Knee - Pain    HPI patient is a pleasant 75 year old female who comes in today with bilateral knee pain right greater than left for the past several months. No known injury or change in activity. The pain she has primarily to the medial aspect and is worse with exercising, walking or  going from a seated to standing position. She has been taking Tylenol and using Voltaren gel without significant relief of symptoms. No previous cortisone injection to either knee. Other issue she brings up today is pain to the dorsum of the right foot. This began after starting an exercise program a few weeks ago where she stands on her tiptoes. She has since stopped this and her pain has just slightly improved.  Review of Systems as detailed in HPI. All others reviewed and are negative.   Objective: Vital Signs: There were no vitals taken for this visit.  Physical Exam well-developed well-nourished female no acute distress. Alert and oriented x3.  Ortho Exam bilateral knee exam shows a trace effusion on the right. No effusion on the left. Marked patellofemoral crepitus both sides. Medial joint line tenderness on the right. Wounds are stable. She is neurovascular intact distally.  Specialty Comments:  No specialty comments available.  Imaging: XR Foot Complete Right  Result Date: 02/28/2020 No acute or structural abnormalities  XR KNEE 3 VIEW LEFT  Result Date: 02/28/2020 Advanced medial and patellofemoral degenerative changes  XR KNEE 3 VIEW RIGHT  Result Date: 02/28/2020 Advanced medial and patellofemoral degenerative changes    PMFS History: Patient Active Problem List  Diagnosis Date Noted  . Prediabetes 08/02/2019  . Acute phlegmonous appendicitis s/p lap appendectomy 07/06/2019 07/05/2019  . Chronic anticoagulation - Eliquis 07/05/2019  . Noncompliance with medication treatment due to intermittent use of medication 07/05/2019  . Diabetes mellitus without complication (HCC)   . Cataract 05/03/2019  . Osteopenia of left upper arm 05/03/2019  . Arthritis of left acromioclavicular joint 05/03/2019  . Chronic pain of both shoulders 05/03/2019  . 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  . Gastroesophageal reflux disease 10/21/2018  . Hyperlipidemia 10/21/2018  .  History of pulmonary embolus (PE) - Dx'd 05/2018  06/21/2018   Past Medical History:  Diagnosis Date  . 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 High cholesterol   . History of pulmonary embolus (PE) - Dx'd 05/2018  06/21/2018    Family History  Problem Relation Age of Onset  . Hypertension Mother   . Hypertension Father     Past Surgical History:  Procedure Laterality Date  . CESAREAN SECTION    . EYE SURGERY    . LAPAROSCOPIC APPENDECTOMY N/A 07/06/2019   Procedure: APPENDECTOMY LAPAROSCOPIC, TAP BLOCK;  Surgeon: Karie Soda, MD;  Location: [facility];  Service: General;  Laterality: N/A;   Social History   Occupational History  . Occupation: retired    Associate Professor: [employer]  Tobacco Use  . Smoking status: Never Smoker  . Smokeless tobacco: Never Used  Vaping Use  . Vaping Use: Never used  Substance and Sexual Activity  . Alcohol use: Never  . Drug use: Never  . Sexual activity: Not on file

## 2020-02-29 MED ORDER — LIDOCAINE HCL 1 % IJ SOLN
2.0000 mL | INTRAMUSCULAR | Status: AC | PRN
  Administered 2020-02-28: 2 mL

## 2020-02-29 MED ORDER — BUPIVACAINE HCL 0.25 % IJ SOLN
2.0000 mL | INTRAMUSCULAR | Status: AC | PRN
  Administered 2020-02-28: 2 mL via INTRA_ARTICULAR

## 2020-02-29 MED ORDER — METHYLPREDNISOLONE ACETATE 40 MG/ML IJ SUSP
40.0000 mg | INTRAMUSCULAR | Status: AC | PRN
  Administered 2020-02-28: 40 mg via INTRA_ARTICULAR

## 2020-03-14 ENCOUNTER — Telehealth: Payer: Self-pay | Admitting: Family Medicine

## 2020-03-14 NOTE — Telephone Encounter (Signed)
Pt is calling in stating that she has a very bad head cold and she takes eliquis and would like to know what she can take for the head cold with that medication.  Pt declined an appointment.  Pt would like to have a call back.  Pharm:  [facility] on [address].

## 2020-03-22 NOTE — Telephone Encounter (Signed)
Spoke with pt state that she was seen at the [facility] and is taking Sudafed which seems to clear her cold, pt state that she will call the office if she is not better by next week.

## 2020-03-30 ENCOUNTER — Other Ambulatory Visit: Payer: Self-pay | Admitting: Family Medicine

## 2020-03-30 DIAGNOSIS — K219 Gastro-esophageal reflux disease without esophagitis: Secondary | ICD-10-CM

## 2020-04-15 ENCOUNTER — Ambulatory Visit: Payer: Medicare Other | Attending: Internal Medicine

## 2020-04-15 DIAGNOSIS — Z23 Encounter for immunization: Secondary | ICD-10-CM

## 2020-04-15 NOTE — Progress Notes (Signed)
   Covid-19 Vaccination Clinic  Name:  Betty [name]    MRN: [id number] DOB: 06-26-1945  04/15/2020  [name] was observed post Covid-19 immunization for 15 minutes without incident. She was provided with Vaccine Information Sheet and instruction to access the V-Safe system.   [name] was instructed to call 911 with any severe reactions post vaccine: Marland Kitchen Difficulty breathing  . Swelling of face and throat  . A fast heartbeat  . A bad rash all over body  . Dizziness and weakness   Immunizations Administered    Name Date Dose VIS Date Route   PFIZER Comrnaty(Gray TOP) Covid-19 Vaccine 04/15/2020  2:19 PM 0.3 mL 02/01/2020 Intramuscular   Manufacturer: [employer], [employer]   Lot: [id number]   NDC: [phone]

## 2020-04-29 DIAGNOSIS — H26492 Other secondary cataract, left eye: Secondary | ICD-10-CM | POA: Diagnosis not present

## 2020-05-13 ENCOUNTER — Encounter: Payer: Self-pay | Admitting: Family Medicine

## 2020-05-13 ENCOUNTER — Other Ambulatory Visit: Payer: Self-pay | Admitting: Family Medicine

## 2020-05-13 ENCOUNTER — Ambulatory Visit (INDEPENDENT_AMBULATORY_CARE_PROVIDER_SITE_OTHER): Payer: Medicare Other | Admitting: Family Medicine

## 2020-05-13 ENCOUNTER — Other Ambulatory Visit: Payer: Self-pay

## 2020-05-13 VITALS — BP 130/82 | HR 67 | Temp 98.2°F | Wt 148.2 lb

## 2020-05-13 DIAGNOSIS — R5383 Other fatigue: Secondary | ICD-10-CM | POA: Diagnosis not present

## 2020-05-13 DIAGNOSIS — R630 Anorexia: Secondary | ICD-10-CM

## 2020-05-13 DIAGNOSIS — Z1159 Encounter for screening for other viral diseases: Secondary | ICD-10-CM

## 2020-05-13 DIAGNOSIS — M791 Myalgia, unspecified site: Secondary | ICD-10-CM

## 2020-05-13 DIAGNOSIS — M25512 Pain in left shoulder: Secondary | ICD-10-CM

## 2020-05-13 DIAGNOSIS — Z86711 Personal history of pulmonary embolism: Secondary | ICD-10-CM | POA: Diagnosis not present

## 2020-05-13 DIAGNOSIS — K219 Gastro-esophageal reflux disease without esophagitis: Secondary | ICD-10-CM

## 2020-05-13 DIAGNOSIS — E782 Mixed hyperlipidemia: Secondary | ICD-10-CM

## 2020-05-13 DIAGNOSIS — F419 Anxiety disorder, unspecified: Secondary | ICD-10-CM

## 2020-05-13 NOTE — Progress Notes (Signed)
Subjective:    Patient ID: Betty [name], female    DOB: [birth year], 75 y.o.   MRN: [id number]  No chief complaint on file.   HPI Patient is a 75 year old female with past medical history significant for DM 2, HLD, GERD,  H/o PE x 1, joint pain who was seen today for follow-up after being lost to follow-up for several months.  Patient endorses concerned about her cholesterol level.  Pt stopped taking atorvastatin as she was having myalgias.  Pt unsure if she notes improvement in muscle soreness.  Walking daily for exercise but notes soreness in left flank after reaching up at work.  Pt working a part-time job 6 hours/day.  Last increase physical activity but does not want to be similar.  Pt seen by Ortho for left shoulder and left knee pain, s/p injections.  Also using Voltaren gel.  Unsure if symptoms have improved but has not scheduled a follow-up appointment with Ortho.  Patient states she is worried about her overall health and she had recent eye surgery for "scar tissue" in the back of her eyes.  Pt inquires about continuing Eliquis for history of PE x1 3 years ago while in [place].  Pt states she decreased her dose and is now taking Eliquis 5 mg daily.  Pt states Eliquis caused her skin to be sensitive and make her feel cold.  Past Medical History:  Diagnosis Date  . 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 High cholesterol   . History of pulmonary embolus (PE) - Dx'd 05/2018  06/21/2018    Allergies  Allergen Reactions  . Sulfa Antibiotics Other (See Comments)    bristers    ROS General: Denies fever, chills, night sweats, changes in weight, changes in appetite  Decreased appetite, fatigue HEENT: Denies headaches, ear pain, changes in vision, rhinorrhea, sore throat CV: Denies CP, palpitations, SOB, orthopnea Pulm: Denies SOB, cough, wheezing GI: Denies abdominal pain, nausea, vomiting, diarrhea, constipation GU: Denies dysuria, hematuria, frequency, vaginal  discharge Msk: Denies muscle cramps  + joint pain, muscle soreness Neuro: Denies weakness, numbness, tingling Skin: Denies rashes, bruising Psych: Denies depression, anxiety, hallucinations + increased worrying     Objective:    Blood pressure 130/82, pulse 67, temperature 98.2 F (36.8 C), temperature source Oral, weight 148 lb 3.2 oz (67.2 kg), SpO2 98 %.  Gen. Pleasant, well-nourished, anxious, normal affect   HEENT: [place]/AT, face symmetric, conjunctiva clear, no scleral icterus, PERRLA, EOMI, nares patent without drainage Lungs: no accessory muscle use Cardiovascular: RRR, no peripheral edema Neuro:  A&Ox3, CN II-XII intact, normal gait Skin:  Warm, no lesions/ rash   Wt Readings from Last 3 Encounters:  05/13/20 148 lb 3.2 oz (67.2 kg)  10/20/19 145 lb (65.8 kg)  07/07/19 143 lb 1.6 oz (64.9 kg)    Lab Results  Component Value Date   WBC 5.7 10/20/2019   HGB 14.6 10/20/2019   HCT 43.6 10/20/2019   PLT 223 10/20/2019   GLUCOSE 66 10/20/2019   CHOL 178 10/20/2019   TRIG 204 (H) 10/20/2019   HDL 55 10/20/2019   LDLCALC 93 10/20/2019   ALT 15 10/20/2019   AST 19 10/20/2019   NA 140 10/20/2019   K 4.1 10/20/2019   CL 105 10/20/2019   CREATININE 0.74 10/20/2019   BUN 10 10/20/2019   CO2 29 10/20/2019   TSH 2.30 10/20/2019   HGBA1C 5.5 10/20/2019    Assessment/Plan:  Mixed hyperlipidemia  -will evaluate continued need  for atorvostatin as currently being held -Continue lifestyle modifications -Patient to return for labs when fasting - Plan: Lipid panel  Myalgia -Pt unsure of improvement s/p holding atorvastatin - Plan: Comprehensive metabolic panel  History of pulmonary embolus (PE) - Dx'd 05/2018  -given h/o of PE x 1 will d/c eliquis -pt advised of r/b/a -ok to start ASA 81 mg daily  Pain in joint of left shoulder -Continue follow-up with Ortho  Decreased appetite  -3 lb wt gain since OFV on 10/20/2019 -Given precautions -consider colonoscopy -  Plan: Ambulatory referral to Gastroenterology, Comprehensive metabolic panel  Gastroesophageal reflux disease, unspecified whether esophagitis present -Avoid foods known to cause symptoms -Continue Prilosec - Plan: Ambulatory referral to Gastroenterology, Vitamin B12, Comprehensive metabolic panel  Fatigue, unspecified type  - Plan: TSH, T4, free, Vitamin D, 25-hydroxy, Vitamin B12, CBC with Differential/Platelet, Hemoglobin A1c  Encounter for hepatitis C screening test for low risk patient  - Plan: Hep C Antibody  Anxiety -Patient encouraged to consider counseling.  Seems hesitant -Continue to monitor each visit  F/u prn in 1 month  Abbe Amsterdam, MD

## 2020-05-13 NOTE — Patient Instructions (Signed)
Gastroesophageal Reflux Disease, Adult  Gastroesophageal reflux (GER) happens when acid from the stomach flows up into the tube that connects the mouth and the stomach (esophagus). Normally, food travels down the esophagus and stays in the stomach to be digested. With GER, food and stomach acid sometimes move back up into the esophagus. You may have a disease called gastroesophageal reflux disease (GERD) if the reflux:  Happens often.  Causes frequent or very bad symptoms.  Causes problems such as damage to the esophagus. When this happens, the esophagus becomes sore and swollen. Over time, GERD can make small holes (ulcers) in the lining of the esophagus. What are the causes? This condition is caused by a problem with the muscle between the esophagus and the stomach. When this muscle is weak or not normal, it does not close properly to keep food and acid from coming back up from the stomach. The muscle can be weak because of:  Tobacco use.  Pregnancy.  Having a certain type of hernia (hiatal hernia).  Alcohol use.  Certain foods and drinks, such as coffee, chocolate, onions, and peppermint. What increases the risk?  Being overweight.  Having a disease that affects your connective tissue.  Taking NSAIDs, such a ibuprofen. What are the signs or symptoms?  Heartburn.  Difficult or painful swallowing.  The feeling of having a lump in the throat.  A bitter taste in the mouth.  Bad breath.  Having a lot of saliva.  Having an upset or bloated stomach.  Burping.  Chest pain. Different conditions can cause chest pain. Make sure you see your doctor if you have chest pain.  Shortness of breath or wheezing.  A [place]-term cough or a cough at night.  Wearing away of the surface of teeth (tooth enamel).  Weight loss. How is this treated?  Making changes to your diet.  Taking medicine.  Having surgery. Treatment will depend on how bad your symptoms are. Follow these  instructions at home: Eating and drinking  Follow a diet as told by your doctor. You may need to avoid foods and drinks such as: ? Coffee and tea, with or without caffeine. ? Drinks that contain alcohol. ? Energy drinks and sports drinks. ? Bubbly (carbonated) drinks or sodas. ? Chocolate and cocoa. ? Peppermint and mint flavorings. ? Garlic and onions. ? Horseradish. ? Spicy and acidic foods. These include peppers, chili powder, curry powder, vinegar, hot sauces, and BBQ sauce. ? Citrus fruit juices and citrus fruits, such as oranges, lemons, and limes. ? Tomato-based foods. These include red sauce, chili, salsa, and pizza with red sauce. ? Fried and fatty foods. These include donuts, french fries, potato chips, and high-fat dressings. ? High-fat meats. These include hot dogs, rib eye steak, sausage, ham, and bacon. ? High-fat dairy items, such as whole milk, butter, and cream cheese.  Eat small meals often. Avoid eating large meals.  Avoid drinking large amounts of liquid with your meals.  Avoid eating meals during the 2-3 hours before bedtime.  Avoid lying down right after you eat.  Do not exercise right after you eat.   Lifestyle  Do not smoke or use any products that contain nicotine or tobacco. If you need help quitting, ask your doctor.  Try to lower your stress. If you need help doing this, ask your doctor.  If you are overweight, lose an amount of weight that is healthy for you. Ask your doctor about a safe weight loss goal.   General instructions    Pay attention to any changes in your symptoms.  Take over-the-counter and prescription medicines only as told by your doctor.  Do not take aspirin, ibuprofen, or other NSAIDs unless your doctor says it is okay.  Wear loose clothes. Do not wear anything tight around your waist.  Raise (elevate) the head of your bed about 6 inches (15 cm). You may need to use a wedge to do this.  Avoid bending over if this makes your  symptoms worse.  Keep all follow-up visits. Contact a doctor if:  You have new symptoms.  You lose weight and you do not know why.  You have trouble swallowing or it hurts to swallow.  You have wheezing or a cough that keeps happening.  You have a hoarse voice.  Your symptoms do not get better with treatment. Get help right away if:  You have sudden pain in your arms, neck, jaw, teeth, or back.  You suddenly feel sweaty, dizzy, or light-headed.  You have chest pain or shortness of breath.  You vomit and the vomit is green, yellow, or black, or it looks like blood or coffee grounds.  You faint.  Your poop (stool) is red, bloody, or black.  You cannot swallow, drink, or eat. These symptoms may represent a serious problem that is an emergency. Do not wait to see if the symptoms will go away. Get medical help right away. Call your local emergency services (911 in the [place] Do not drive yourself to the hospital. Summary  If a person has gastroesophageal reflux disease (GERD), food and stomach acid move back up into the esophagus and cause symptoms or problems such as damage to the esophagus.  Treatment will depend on how bad your symptoms are.  Follow a diet as told by your doctor.  Take all medicines only as told by your doctor. This information is not intended to replace advice given to you by your health care provider. Make sure you discuss any questions you have with your health care provider. Document Revised: 08/21/2019 Document Reviewed: 08/21/2019 Elsevier Patient Education  2021 [employer]  Musculoskeletal Pain Musculoskeletal pain refers to aches and pains in your bones, joints, muscles, and the tissues that surround them. This pain can occur in any part of the body. It can last for a short time (acute) or a Kosinski time (chronic). A physical exam, lab tests, and imaging studies may be done to find the cause of your musculoskeletal pain. Follow these  instructions at home: Lifestyle  Try to control or lower your stress levels. Stress increases muscle tension and can worsen musculoskeletal pain. It is important to recognize when you are anxious or stressed and learn ways to manage it. This may include: ? Meditation or yoga. ? Cognitive or behavioral therapy. ? Acupuncture or massage therapy.  You may continue all activities unless the activities cause more pain. When the pain gets better, slowly resume your normal activities. Gradually increase the intensity and duration of your activities or exercise. Managing pain, stiffness, and swelling  Treatment may include medicines for pain and inflammation that are taken by mouth or applied to the skin. Take over-the-counter and prescription medicines only as told by your health care provider.  When your pain is severe, bed rest may be helpful. Lie or sit in any position that is comfortable, but get out of bed and walk around at least every couple of hours.  If directed, apply heat to the affected area as often as told by your  health care provider. Use the heat source that your health care provider recommends, such as a moist heat pack or a heating pad. ? Place a towel between your skin and the heat source. ? Leave the heat on for 20-30 minutes. ? Remove the heat if your skin turns bright red. This is especially important if you are unable to feel pain, heat, or cold. You may have a greater risk of getting burned.  If directed, put ice on the painful area. To do this: ? Put ice in a plastic bag. ? Place a towel between your skin and the bag. ? Leave the ice on for 20 minutes, 2-3 times a day. ? Remove the ice if your skin turns bright red. This is very important. If you cannot feel pain, heat, or cold, you have a greater risk of damage to the area.      General instructions  Your health care provider may recommend that you see a physical therapist. This person can help you come up with a safe  exercise program.  If told by your health care provider, do physical therapy exercises to improve movement and strength in the affected area.  Keep all follow-up visits. This is important. This includes any physical therapy visits. Contact a health care provider if:  Your pain gets worse.  Medicines do not help ease your pain.  You cannot use the part of your body that hurts, such as your arm, leg, or neck.  You have trouble sleeping.  You have trouble doing your normal activities. Get help right away if:  You have a new injury and your pain is worse or different.  You feel numb or you have tingling in the painful area. Summary  Musculoskeletal pain refers to aches and pains in your bones, joints, muscles, and the tissues that surround them.  This pain can occur in any part of the body.  Your health care provider may recommend that you see a physical therapist. This person can help you come up with a safe exercise program. Do any exercises as told by your physical therapist.  Lower your stress level. Stress can worsen musculoskeletal pain. Ways to lower stress may include meditation, yoga, cognitive or behavioral therapy, acupuncture, and massage therapy. This information is not intended to replace advice given to you by your health care provider. Make sure you discuss any questions you have with your health care provider. Document Revised: 06/15/2019 Document Reviewed: 05/24/2019 Elsevier Patient Education  2021 [employer]  Weakness Weakness is a lack of strength. You may feel weak all over your body (generalized), or you may feel weak in one part of your body (focal). There are many potential causes of weakness. Sometimes, the cause of your weakness may not be known. Some causes of weakness can be serious, so it is important to see your doctor. Follow these instructions at home: Activity  Rest as needed.  Try to get enough sleep. Most adults need 7-8 hours of sleep each  night. Talk to your doctor about how much sleep you need each night.  Do exercises, such as arm curls and leg raises, for 30 minutes at least 2 days a week or as told by your doctor.  Think about working with a physical therapist or trainer to help you get stronger. General instructions  Take over-the-counter and prescription medicines only as told by your doctor.  Eat a healthy, well-balanced diet. This includes: ? Proteins to build muscles, such as lean meats and  fish. ? Fresh fruits and vegetables. ? Carbohydrates to boost energy, such as whole grains.  Drink enough fluid to keep your pee (urine) pale yellow.  Keep all follow-up visits as told by your doctor. This is important.   Contact a doctor if:  Your weakness does not get better or it gets worse.  Your weakness affects your ability to: ? Think clearly. ? Do your normal daily activities. Get help right away if you:  Have sudden weakness on one side of your face or body.  Have chest pain.  Have trouble breathing or shortness of breath.  Have problems with your vision.  Have trouble talking or swallowing.  Have trouble standing or walking.  Are light-headed.  Pass out (lose consciousness). Summary  Weakness is a lack of strength. You may feel weak all over your body or just in one part of your body.  There are many potential causes of weakness. Sometimes, the cause of your weakness may not be known.  Rest as needed, and try to get enough sleep. Most adults need 7-8 hours of sleep each night.  Eat a healthy, well-balanced diet. This information is not intended to replace advice given to you by your health care provider. Make sure you discuss any questions you have with your health care provider. Document Revised: 09/15/2017 Document Reviewed: 09/15/2017 Elsevier Patient Education  2021 [employer].

## 2020-06-06 ENCOUNTER — Other Ambulatory Visit (INDEPENDENT_AMBULATORY_CARE_PROVIDER_SITE_OTHER): Payer: Medicare Other

## 2020-06-06 ENCOUNTER — Other Ambulatory Visit: Payer: Self-pay

## 2020-06-06 DIAGNOSIS — R630 Anorexia: Secondary | ICD-10-CM | POA: Diagnosis not present

## 2020-06-06 DIAGNOSIS — Z1159 Encounter for screening for other viral diseases: Secondary | ICD-10-CM

## 2020-06-06 DIAGNOSIS — R5383 Other fatigue: Secondary | ICD-10-CM

## 2020-06-06 DIAGNOSIS — K219 Gastro-esophageal reflux disease without esophagitis: Secondary | ICD-10-CM

## 2020-06-06 DIAGNOSIS — E782 Mixed hyperlipidemia: Secondary | ICD-10-CM | POA: Diagnosis not present

## 2020-06-06 DIAGNOSIS — M791 Myalgia, unspecified site: Secondary | ICD-10-CM

## 2020-06-06 LAB — T4, FREE: Free T4: 0.68 ng/dL (ref 0.60–1.60)

## 2020-06-06 LAB — COMPREHENSIVE METABOLIC PANEL
ALT: 15 U/L (ref 0–35)
AST: 18 U/L (ref 0–37)
Albumin: 4.3 g/dL (ref 3.5–5.2)
Alkaline Phosphatase: 59 U/L (ref 39–117)
BUN: 11 mg/dL (ref 6–23)
CO2: 30 mEq/L (ref 19–32)
Calcium: 9.8 mg/dL (ref 8.4–10.5)
Chloride: 104 mEq/L (ref 96–112)
Creatinine, Ser: 0.85 mg/dL (ref 0.40–1.20)
GFR: 66.52 mL/min (ref >60.00)
Glucose, Bld: 84 mg/dL (ref 70–99)
Potassium: 4.1 mEq/L (ref 3.5–5.1)
Sodium: 141 mEq/L (ref 135–145)
Total Bilirubin: 1 mg/dL (ref 0.2–1.2)
Total Protein: 7.3 g/dL (ref 6.0–8.3)

## 2020-06-06 LAB — HEMOGLOBIN A1C: Hgb A1c MFr Bld: 5.9 % (ref 4.6–6.5)

## 2020-06-06 LAB — CBC WITH DIFFERENTIAL/PLATELET
Basophils Absolute: 0.1 10*3/uL (ref 0.0–0.1)
Basophils Relative: 0.9 % (ref 0.0–3.0)
Eosinophils Absolute: 0.1 10*3/uL (ref 0.0–0.7)
Eosinophils Relative: 2.3 % (ref 0.0–5.0)
HCT: 45.1 % (ref 36.0–46.0)
Hemoglobin: 15.3 g/dL — ABNORMAL HIGH (ref 12.0–15.0)
Lymphocytes Relative: 57.8 % — ABNORMAL HIGH (ref 12.0–46.0)
Lymphs Abs: 3.4 10*3/uL (ref 0.7–4.0)
MCHC: 33.9 g/dL (ref 30.0–36.0)
MCV: 89.6 fl (ref 78.0–100.0)
Monocytes Absolute: 0.7 10*3/uL (ref 0.1–1.0)
Monocytes Relative: 12.3 % — ABNORMAL HIGH (ref 3.0–12.0)
Neutro Abs: 1.6 10*3/uL (ref 1.4–7.7)
Neutrophils Relative %: 26.7 % — ABNORMAL LOW (ref 43.0–77.0)
Platelets: 223 10*3/uL (ref 150.0–400.0)
RBC: 5.03 Mil/uL (ref 3.87–5.11)
RDW: 12.7 % (ref 11.5–15.5)
WBC: 5.8 10*3/uL (ref 4.0–10.5)

## 2020-06-06 LAB — VITAMIN D 25 HYDROXY (VIT D DEFICIENCY, FRACTURES): VITD: 65.44 ng/mL (ref 30.00–100.00)

## 2020-06-06 LAB — LDL CHOLESTEROL, DIRECT: Direct LDL: 204 mg/dL

## 2020-06-06 LAB — LIPID PANEL
Cholesterol: 279 mg/dL — ABNORMAL HIGH (ref 0–200)
HDL: 49.2 mg/dL (ref >39.00)
NonHDL: 230.05
Total CHOL/HDL Ratio: 6
Triglycerides: 236 mg/dL — ABNORMAL HIGH (ref 0.0–149.0)
VLDL: 47.2 mg/dL — ABNORMAL HIGH (ref 0.0–40.0)

## 2020-06-06 LAB — VITAMIN B12: Vitamin B-12: >1506 pg/mL — ABNORMAL HIGH (ref 211–911)

## 2020-06-06 LAB — TSH: TSH: 4.33 u[IU]/mL (ref 0.35–4.50)

## 2020-06-07 LAB — HEPATITIS C ANTIBODY
Hepatitis C Ab: NONREACTIVE
SIGNAL TO CUT-OFF: 0 (ref <1.00)

## 2020-06-13 ENCOUNTER — Other Ambulatory Visit: Payer: Self-pay

## 2020-06-13 ENCOUNTER — Encounter: Payer: Self-pay | Admitting: Family Medicine

## 2020-06-13 ENCOUNTER — Ambulatory Visit (INDEPENDENT_AMBULATORY_CARE_PROVIDER_SITE_OTHER): Payer: Medicare Other | Admitting: Family Medicine

## 2020-06-13 VITALS — BP 138/86 | Temp 98.3°F | Wt 149.2 lb

## 2020-06-13 DIAGNOSIS — E782 Mixed hyperlipidemia: Secondary | ICD-10-CM

## 2020-06-13 DIAGNOSIS — R1310 Dysphagia, unspecified: Secondary | ICD-10-CM

## 2020-06-13 DIAGNOSIS — R7303 Prediabetes: Secondary | ICD-10-CM

## 2020-06-13 DIAGNOSIS — R030 Elevated blood-pressure reading, without diagnosis of hypertension: Secondary | ICD-10-CM

## 2020-06-13 MED ORDER — PRAVASTATIN SODIUM 10 MG PO TABS: 10.0000 mg | ORAL_TABLET | Freq: Every day | ORAL | 1 refills | Status: DC

## 2020-06-13 NOTE — Progress Notes (Signed)
Subjective:    Patient ID: Betty [name], female    DOB: 1945/03/01, 75 y.o.   MRN: [id number]  Chief Complaint  Patient presents with  . Follow-up    HPI Patient is a 75 year old female with pmh sig for pre-DM, HLD, history of unprovoked PE in 2020 who was seen today for follow-up.  Patient states she has been doing well overall.  Notes feeling better since no longer taking Eliquis.  Taking aspirin 81 mg daily.  Patient inquires about elevation in total cholesterol and triglycerides on 06/06/2020.  Still having dysphagia with swallowing solids and pills.  Patient states she never received a call about the GI referral placed 05/13/2020.   Past Medical History:  Diagnosis Date  . 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 High cholesterol   . History of pulmonary embolus (PE) - Dx'd 05/2018  06/21/2018    Allergies  Allergen Reactions  . Sulfa Antibiotics Other (See Comments)    bristers    ROS General: Denies fever, chills, night sweats, changes in weight, changes in appetite HEENT: Denies headaches, ear pain, changes in vision, rhinorrhea, sore throat CV: Denies CP, palpitations, SOB, orthopnea Pulm: Denies SOB, cough, wheezing GI: Denies abdominal pain, nausea, vomiting, diarrhea, constipation  + dysphagia GU: Denies dysuria, hematuria, frequency, vaginal discharge Msk: Denies muscle cramps, joint pains Neuro: Denies weakness, numbness, tingling Skin: Denies rashes, bruising Psych: Denies depression, anxiety, hallucinations     Objective:    Blood pressure 138/86, temperature 98.3 F (36.8 C), temperature source Oral, weight 149 lb 3.2 oz (67.7 kg).  Gen. Pleasant, well-nourished, in no distress, normal affect   HEENT: Barrett/AT, face symmetric, conjunctiva clear, no scleral icterus, PERRLA, EOMI, nares patent without drainage, pharynx without erythema or exudate. Lungs: no accessory muscle use, CTAB, no wheezes or rales Cardiovascular: RRR, no m/r/g, no  peripheral edema Musculoskeletal: No deformities, no cyanosis or clubbing, normal tone Neuro:  A&Ox3, CN II-XII intact, normal gait Skin:  Warm, no lesions/ rash   Wt Readings from Last 3 Encounters:  06/13/20 149 lb 3.2 oz (67.7 kg)  05/13/20 148 lb 3.2 oz (67.2 kg)  10/20/19 145 lb (65.8 kg)    Lab Results  Component Value Date   WBC 5.8 06/06/2020   HGB 15.3 (H) 06/06/2020   HCT 45.1 06/06/2020   PLT 223.0 06/06/2020   GLUCOSE 84 06/06/2020   CHOL 279 (H) 06/06/2020   TRIG 236.0 (H) 06/06/2020   HDL 49.20 06/06/2020   LDLDIRECT 204.0 06/06/2020   LDLCALC 93 10/20/2019   ALT 15 06/06/2020   AST 18 06/06/2020   NA 141 06/06/2020   K 4.1 06/06/2020   CL 104 06/06/2020   CREATININE 0.85 06/06/2020   BUN 11 06/06/2020   CO2 30 06/06/2020   TSH 4.33 06/06/2020   HGBA1C 5.9 06/06/2020    Assessment/Plan:  Mixed hyperlipidemia -Atorvastatin d/c'd 2/2 myalgias -Discussed starting pravastatin 10 mg -Lifestyle modifications encouraged - Plan: pravastatin (PRAVACHOL) 10 MG tablet  Dysphagia, unspecified type -Referral for GI placed 05/13/2020 will have office contact patient in regards to scheduling appointment -Continue Prilosec 20 mg as needed  Prediabetes -Hemoglobin A1c 5.9% on 06/06/2020 -Discussed lifestyle modifications  Elevated blood-pressure reading without diagnosis of hypertension -Patient encouraged to decrease sodium intake, increase water intake, increase physical activity and other lifestyle occasions -We will continue to monitor.    F/u as needed in the next 3-4 months  Abbe Amsterdam, MD

## 2020-06-13 NOTE — Patient Instructions (Signed)
Dyslipidemia Dyslipidemia is an imbalance of waxy, fat-like substances (lipids) in the blood. The body needs lipids in small amounts. Dyslipidemia often involves a high level of cholesterol or triglycerides, which are types of lipids. Common forms of dyslipidemia include:  High levels of LDL cholesterol. LDL is the type of cholesterol that causes fatty deposits (plaques) to build up in the blood vessels that carry blood away from your heart (arteries).  Low levels of HDL cholesterol. HDL cholesterol is the type of cholesterol that protects against heart disease. High levels of HDL remove the LDL buildup from arteries.  High levels of triglycerides. Triglycerides are a fatty substance in the blood that is linked to a buildup of plaques in the arteries. What are the causes? Primary dyslipidemia is caused by changes (mutations) in genes that are passed down through families (inherited). These mutations cause several types of dyslipidemia. Secondary dyslipidemia is caused by lifestyle choices and diseases that lead to dyslipidemia, such as:  Eating a diet that is high in animal fat.  Not getting enough exercise.  Having diabetes, kidney disease, liver disease, or thyroid disease.  Drinking large amounts of alcohol.  Using certain medicines. What increases the risk? You are more likely to develop this condition if you are an older man or if you are a woman who has gone through menopause. Other risk factors include:  Having a family history of dyslipidemia.  Taking certain medicines, including birth control pills, steroids, some diuretics, and beta-blockers.  Smoking cigarettes.  Eating a high-fat diet.  Having certain medical conditions such as diabetes, polycystic ovary syndrome (PCOS), kidney disease, liver disease, or hypothyroidism.  Not exercising regularly.  Being overweight or obese with too much belly fat. What are the signs or symptoms? In most cases, dyslipidemia does not  usually cause any symptoms. In severe cases, very high lipid levels can cause:  Fatty bumps under the skin (xanthomas).  White or gray ring around the black center (pupil) of the eye. Very high triglyceride levels can cause inflammation of the pancreas (pancreatitis). How is this diagnosed? Your health care provider may diagnose dyslipidemia based on a routine blood test (fasting blood test). Because most people do not have symptoms of the condition, this blood testing (lipid profile) is done on adults age 20 and older and is repeated every 5 years. This test checks:  Total cholesterol. This measures the total amount of cholesterol in your blood, including LDL cholesterol, HDL cholesterol, and triglycerides. A healthy number is below 200.  LDL cholesterol. The target number for LDL cholesterol is different for each person, depending on individual risk factors. Ask your health care provider what your LDL cholesterol should be.  HDL cholesterol. An HDL level of 60 or higher is best because it helps to protect against heart disease. A number below 25 for men or below 28 for women increases the risk for heart disease.  Triglycerides. A healthy triglyceride number is below 150. If your lipid profile is abnormal, your health care provider may do other blood tests.   How is this treated? Treatment depends on the type of dyslipidemia that you have and your other risk factors for heart disease and stroke. Your health care provider will have a target range for your lipid levels based on this information. For many people, this condition may be treated by lifestyle changes, such as diet and exercise. Your health care provider may recommend that you:  Get regular exercise.  Make changes to your diet.  Quit smoking  if you smoke. If diet changes and exercise do not help you reach your goals, your health care provider may also prescribe medicine to lower lipids. The most commonly prescribed type of  medicine lowers your LDL cholesterol (statin drug). If you have a high triglyceride level, your provider may prescribe another type of drug (fibrate) or an omega-3 fish oil supplement, or both. Follow these instructions at home: Eating and drinking  Follow instructions from your health care provider or dietitian about eating or drinking restrictions.  Eat a healthy diet as told by your health care provider. This can help you reach and maintain a healthy weight, lower your LDL cholesterol, and raise your HDL cholesterol. This may include: ? Limiting your calories, if you are overweight. ? Eating more fruits, vegetables, whole grains, fish, and lean meats. ? Limiting saturated fat, trans fat, and cholesterol.  If you drink alcohol: ? Limit how much you use. ? Be aware of how much alcohol is in your drink. In the [place] one drink equals one 12 oz bottle of beer (355 mL), one 5 oz glass of wine (148 mL), or one 1 oz glass of hard liquor (44 mL).  Do not drink alcohol if: ? Your health care provider tells you not to drink. ? You are pregnant, may be pregnant, or are planning to become pregnant. Activity  Get regular exercise. Start an exercise and strength training program as told by your health care provider. Ask your health care provider what activities are safe for you. Your health care provider may recommend: ? 30 minutes of aerobic activity 4-6 days a week. Brisk walking is an example of aerobic activity. ? Strength training 2 days a week. General instructions  Do not use any products that contain nicotine or tobacco, such as cigarettes, e-cigarettes, and chewing tobacco. If you need help quitting, ask your health care provider.  Take over-the-counter and prescription medicines only as told by your health care provider. This includes supplements.  Keep all follow-up visits as told by your health care provider.   Contact a health care provider if:  You are: ? Having trouble sticking  to your exercise or diet plan. ? Struggling to quit smoking or control your use of alcohol. Summary  Dyslipidemia often involves a high level of cholesterol or triglycerides, which are types of lipids.  Treatment depends on the type of dyslipidemia that you have and your other risk factors for heart disease and stroke.  For many people, treatment starts with lifestyle changes, such as diet and exercise.  Your health care provider may prescribe medicine to lower lipids. This information is not intended to replace advice given to you by your health care provider. Make sure you discuss any questions you have with your health care provider. Document Revised: 10/04/2017 Document Reviewed: 09/10/2017 Elsevier Patient Education  2021 Elsevier Inc.  Diabetes Care, 44(Suppl 1), [phone]. https://doi.org/https://doi.org/10.2337/dc21-[email or web address]">  Prediabetes Prediabetes is when your blood sugar (blood glucose) level is higher than normal but not high enough for you to be diagnosed with type 2 diabetes. Having prediabetes puts you at risk for developing type 2 diabetes (type 2 diabetes mellitus). With certain lifestyle changes, you may be able to prevent or delay the onset of type 2 diabetes. This is important because type 2 diabetes can lead to serious complications, such as:  Heart disease.  Stroke.  Blindness.  Kidney disease.  Depression.  Poor circulation in the feet and legs. In severe cases, this could lead to surgical removal  of a leg (amputation). What are the causes? The exact cause of prediabetes is not known. It may result from insulin resistance. Insulin resistance develops when cells in the body do not respond properly to insulin that the body makes. This can cause excess glucose to build up in the blood. High blood glucose (hyperglycemia) can develop. What increases the risk? The following factors may make you more likely to develop this condition:  You have a family member with  type 2 diabetes.  You are older than 45 years.  You had a temporary form of diabetes during a pregnancy (gestational diabetes).  You had polycystic ovary syndrome (PCOS).  You are overweight or obese.  You are inactive (sedentary).  You have a history of heart disease, including problems with cholesterol levels, high levels of blood fats, or high blood pressure. What are the signs or symptoms? You may have no symptoms. If you do have symptoms, they may include:  Increased hunger.  Increased thirst.  Increased urination.  Vision changes, such as blurry vision.  Tiredness (fatigue). How is this diagnosed? This condition can be diagnosed with blood tests. Your blood glucose may be checked with one or more of the following tests:  A fasting blood glucose (FBG) test. You will not be allowed to eat (you will fast) for at least 8 hours before a blood sample is taken.  An A1C blood test (hemoglobin A1C). This test provides information about blood glucose levels over the previous 2?3 months.  An oral glucose tolerance test (OGTT). This test measures your blood glucose at two points in time: ? After fasting. This is your baseline level. ? Two hours after you drink a beverage that contains glucose. You may be diagnosed with prediabetes if:  Your FBG is 100?125 mg/dL (9.7-3.5 mmol/L).  Your A1C level is 5.7?6.4% (39-46 mmol/mol).  Your OGTT result is 140?199 mg/dL (3.2-99 mmol/L). These blood tests may be repeated to confirm your diagnosis.   How is this treated? Treatment may include dietary and lifestyle changes to help lower your blood glucose and prevent type 2 diabetes from developing. In some cases, medicine may be prescribed to help lower the risk of type 2 diabetes. Follow these instructions at home: Nutrition  Follow a healthy meal plan. This includes eating lean proteins, whole grains, legumes, fresh fruits and vegetables, low-fat dairy products, and healthy  fats.  Follow instructions from your health care provider about eating or drinking restrictions.  Meet with a dietitian to create a healthy eating plan that is right for you.   Lifestyle  Do moderate-intensity exercise for at least 30 minutes a day on 5 or more days each week, or as told by your health care provider. A mix of activities may be best, such as: ? Brisk walking, swimming, biking, and weight lifting.  Lose weight as told by your health care provider. Losing 5-7% of your body weight can reverse insulin resistance.  Do not drink alcohol if: ? Your health care provider tells you not to drink. ? You are pregnant, may be pregnant, or are planning to become pregnant.  If you drink alcohol: ? Limit how much you use to:  0-1 drink a day for women.  0-2 drinks a day for men. ? Be aware of how much alcohol is in your drink. In the [place] one drink equals one 12 oz bottle of beer (355 mL), one 5 oz glass of wine (148 mL), or one 1 oz glass of hard liquor (44  mL). General instructions  Take over-the-counter and prescription medicines only as told by your health care provider. You may be prescribed medicines that help lower the risk of type 2 diabetes.  Do not use any products that contain nicotine or tobacco, such as cigarettes, e-cigarettes, and chewing tobacco. If you need help quitting, ask your health care provider.  Keep all follow-up visits. This is important. Where to find more information  American Diabetes Association: www.diabetes.org  Academy of Nutrition and Dietetics: www.eatright.org  American Heart Association: www.heart.org Contact a health care provider if:  You have any of these symptoms: ? Increased hunger. ? Increased urination. ? Increased thirst. ? Fatigue. ? Vision changes, such as blurry vision. Get help right away if you:  Have shortness of breath.  Feel confused.  Vomit or feel like you may vomit. Summary  Prediabetes is when your blood  sugar (blood glucose)level is higher than normal but not high enough for you to be diagnosed with type 2 diabetes.  Having prediabetes puts you at risk for developing type 2 diabetes (type 2 diabetes mellitus).  Make lifestyle changes such as eating a healthy diet and exercising regularly to help prevent diabetes. Lose weight as told by your health care provider. This information is not intended to replace advice given to you by your health care provider. Make sure you discuss any questions you have with your health care provider. Document Revised: 05/11/2019 Document Reviewed: 05/11/2019 Elsevier Patient Education  2021 [employer] Inc.  Prediabetes Eating Plan Prediabetes is a condition that causes blood sugar (glucose) levels to be higher than normal. This increases the risk for developing type 2 diabetes (type 2 diabetes mellitus). Working with a health care provider or nutrition specialist (dietitian) to make diet and lifestyle changes can help prevent the onset of diabetes. These changes may help you:  Control your blood glucose levels.  Improve your cholesterol levels.  Manage your blood pressure. What are tips for following this plan? Reading food labels  Read food labels to check the amount of fat, salt (sodium), and sugar in prepackaged foods. Avoid foods that have: ? Saturated fats. ? Trans fats. ? Added sugars.  Avoid foods that have more than 300 milligrams (mg) of sodium per serving. Limit your sodium intake to less than 2,300 mg each day. Shopping  Avoid buying pre-made and processed foods.  Avoid buying drinks with added sugar. Cooking  Cook with olive oil. Do not use butter, lard, or ghee.  Bake, broil, grill, steam, or boil foods. Avoid frying. Meal planning  Work with your dietitian to create an eating plan that is right for you. This may include tracking how many calories you take in each day. Use a food diary, notebook, or mobile application to track what you  eat at each meal.  Consider following a Mediterranean diet. This includes: ? Eating several servings of fresh fruits and vegetables each day. ? Eating fish at least twice a week. ? Eating one serving each day of whole grains, beans, nuts, and seeds. ? Using olive oil instead of other fats. ? Limiting alcohol. ? Limiting red meat. ? Using nonfat or low-fat dairy products.  Consider following a plant-based diet. This includes dietary choices that focus on eating mostly vegetables and fruit, grains, beans, nuts, and seeds.  If you have high blood pressure, you may need to limit your sodium intake or follow a diet such as the DASH (Dietary Approaches to Stop Hypertension) eating plan. The DASH diet aims to lower  high blood pressure.   Lifestyle  Set weight loss goals with help from your health care team. It is recommended that most people with prediabetes lose 7% of their body weight.  Exercise for at least 30 minutes 5 or more days a week.  Attend a support group or seek support from a mental health counselor.  Take over-the-counter and prescription medicines only as told by your health care provider. What foods are recommended? Fruits Berries. Bananas. Apples. Oranges. Grapes. Papaya. Mango. Pomegranate. Kiwi. Grapefruit. Cherries. Vegetables Lettuce. Spinach. Peas. Beets. Cauliflower. Cabbage. Broccoli. Carrots. Tomatoes. Squash. Eggplant. Herbs. Peppers. Onions. Cucumbers. Brussels sprouts. Grains Whole grains, such as whole-wheat or whole-grain breads, crackers, cereals, and pasta. Unsweetened oatmeal. Bulgur. Barley. Quinoa. Brown rice. Corn or whole-wheat flour tortillas or taco shells. Meats and other proteins Seafood. Poultry without skin. Lean cuts of pork and beef. Tofu. Eggs. Nuts. Beans. Dairy Low-fat or fat-free dairy products, such as yogurt, cottage cheese, and cheese. Beverages Water. Tea. Coffee. Sugar-free or diet soda. Seltzer water. Low-fat or nonfat milk. Milk  alternatives, such as soy or almond milk. Fats and oils Olive oil. Canola oil. Sunflower oil. Grapeseed oil. Avocado. Walnuts. Sweets and desserts Sugar-free or low-fat pudding. Sugar-free or low-fat ice cream and other frozen treats. Seasonings and condiments Herbs. Sodium-free spices. Mustard. Relish. Low-salt, low-sugar ketchup. Low-salt, low-sugar barbecue sauce. Low-fat or fat-free mayonnaise. The items listed above may not be a complete list of recommended foods and beverages. Contact a dietitian for more information. What foods are not recommended? Fruits Fruits canned with syrup. Vegetables Canned vegetables. Frozen vegetables with butter or cream sauce. Grains Refined white flour and flour products, such as bread, pasta, snack foods, and cereals. Meats and other proteins Fatty cuts of meat. Poultry with skin. Breaded or fried meat. Processed meats. Dairy Full-fat yogurt, cheese, or milk. Beverages Sweetened drinks, such as iced tea and soda. Fats and oils Butter. Lard. Ghee. Sweets and desserts Baked goods, such as cake, cupcakes, pastries, cookies, and cheesecake. Seasonings and condiments Spice mixes with added salt. Ketchup. Barbecue sauce. Mayonnaise. The items listed above may not be a complete list of foods and beverages that are not recommended. Contact a dietitian for more information. Where to find more information  American Diabetes Association: www.diabetes.org Summary  You may need to make diet and lifestyle changes to help prevent the onset of diabetes. These changes can help you control blood sugar, improve cholesterol levels, and manage blood pressure.  Set weight loss goals with help from your health care team. It is recommended that most people with prediabetes lose 7% of their body weight.  Consider following a Mediterranean diet. This includes eating plenty of fresh fruits and vegetables, whole grains, beans, nuts, seeds, fish, and low-fat dairy, and  using olive oil instead of other fats. This information is not intended to replace advice given to you by your health care provider. Make sure you discuss any questions you have with your health care provider. Document Revised: 05/11/2019 Document Reviewed: 05/11/2019 Elsevier Patient Education  2021 [employer] Inc.  Bradley's Neurology in Clinical Practice (8th ed., pp. [phone]). [place]: Else[employer]."> [employer] Textbook [employer] (21st ed., pp. [id number]). [place], [place]: [employer] Inc.">  Dysphagia  Dysphagia is trouble swallowing. This condition occurs when solids and liquids stick in a person's throat on the way down to the stomach, or when food takes longer to get to the stomach than usual. You may have problems swallowing food, liquids, or both. You may also have pain  while trying to swallow. It may take you more time and effort to swallow something. What are the causes? This condition may be caused by:  Muscle problems. These may make it difficult for you to move food and liquids through the esophagus, which is the tube that connects your mouth to your stomach.  Blockages. You may have ulcers, scar tissue, or inflammation that blocks the normal passage of food and liquids. Causes of these problems include: ? Acid reflux from your stomach into your esophagus (gastroesophageal reflux). ? Infections. ? Radiation treatment for cancer. ? Medicines taken without enough fluids to wash them down into your stomach.  Stroke. This can affect the nerves and make it difficult to swallow.  Nerve problems. These prevent signals from being sent to the muscles of your esophagus to squeeze (contract) and move what you swallow down to your stomach.  Globus pharyngeus. This is a common problem that involves a feeling like something is stuck in your throat or a sense of trouble with swallowing, even though nothing is wrong with the swallowing passages.  Certain conditions, such as cerebral  palsy or Parkinson's disease. What are the signs or symptoms? Common symptoms of this condition include:  A feeling that solids or liquids are stuck in your throat on the way down to the stomach.  Pain while swallowing.  Coughing or gagging while trying to swallow. Other symptoms include:  Food moving back from your stomach to your mouth (regurgitation).  Noises coming from your throat.  Chest discomfort when swallowing.  A feeling of fullness when swallowing.  Drooling, especially when the throat is blocked.  Heartburn. How is this diagnosed? This condition may be diagnosed by:  Barium swallow X-ray. In this test, you will swallow a white liquid that sticks to the inside of your esophagus. X-ray images are then taken.  Endoscopy. In this test, a flexible telescope is inserted down your throat to look at your esophagus and your stomach.  CT scans or an MRI. How is this treated? Treatment for dysphagia depends on the cause of this condition:  If the dysphagia is caused by acid reflux or infection, medicines may be used. These may include antibiotics or heartburn medicines.  If the dysphagia is caused by problems with the muscles, swallowing therapy may be used to help you strengthen your swallowing muscles. You may have to do specific exercises to strengthen the muscles or stretch them.  If the dysphagia is caused by a blockage or mass, procedures to remove the blockage may be done. You may need surgery and a feeding tube. You may need to make diet changes. Ask your health care provider for specific instructions. Follow these instructions at home: Medicines  Take over-the-counter and prescription medicines only as told by your health care provider.  If you were prescribed an antibiotic medicine, take it as told by your health care provider. Do not stop taking the antibiotic even if you start to feel better. Eating and drinking  Make any diet changes as told by your  health care provider.  Work with a diet and nutrition specialist (dietitian) to create an eating plan that will help you get the nutrients you need in order to stay healthy.  Eat soft foods that are easier to swallow.  Cut your food into small pieces and eat slowly. Take small bites.  Eat and drink only when you are sitting upright.  Do not drink alcohol or caffeine. If you need help quitting, ask your health care  provider.   General instructions  Check your weight every day to make sure you are not losing weight.  Do not use any products that contain nicotine or tobacco. These products include cigarettes, chewing tobacco, and vaping devices, such as e-cigarettes. If you need help quitting, ask your health care provider.  Keep all follow-up visits. This is important. Contact a health care provider if:  You lose weight because you cannot swallow.  You cough when you drink liquids.  You cough up partially digested food. Get help right away if:  You cannot swallow your saliva.  You have shortness of breath, a fever, or both.  Your voice is hoarse and you have trouble swallowing. These symptoms may represent a serious problem that is an emergency. Do not wait to see if the symptoms will go away. Get medical help right away. Call your local emergency services (911 in the [place] Do not drive yourself to the hospital. Summary  Dysphagia is trouble swallowing. This condition occurs when solids and liquids stick in a person's throat on the way down to the stomach. You may cough or gag while trying to swallow.  Dysphagia has many possible causes.  Treatment for dysphagia depends on the cause of the condition.  Keep all follow-up visits. This is important. This information is not intended to replace advice given to you by your health care provider. Make sure you discuss any questions you have with your health care provider. Document Revised: 09/30/2019 Document Reviewed:  09/30/2019 Elsevier Patient Education  2021 [employer].

## 2020-06-24 ENCOUNTER — Other Ambulatory Visit: Payer: Self-pay | Admitting: Family Medicine

## 2020-06-24 DIAGNOSIS — K219 Gastro-esophageal reflux disease without esophagitis: Secondary | ICD-10-CM

## 2020-09-09 ENCOUNTER — Encounter: Payer: Self-pay | Admitting: Family Medicine

## 2020-09-09 ENCOUNTER — Telehealth (INDEPENDENT_AMBULATORY_CARE_PROVIDER_SITE_OTHER): Payer: Medicare Other | Admitting: Family Medicine

## 2020-09-09 DIAGNOSIS — H5713 Ocular pain, bilateral: Secondary | ICD-10-CM | POA: Diagnosis not present

## 2020-09-09 DIAGNOSIS — J3489 Other specified disorders of nose and nasal sinuses: Secondary | ICD-10-CM | POA: Diagnosis not present

## 2020-09-09 DIAGNOSIS — R0981 Nasal congestion: Secondary | ICD-10-CM | POA: Diagnosis not present

## 2020-09-09 NOTE — Progress Notes (Signed)
Virtual Visit via Telephone Note  I connected with Betty [name] on 09/09/20 at  4:30 PM EDT by telephone and verified that I am speaking with the correct person using two identifiers.   I discussed the limitations, risks, security and privacy concerns of performing an evaluation and management service by telephone and the availability of in person appointments. I also discussed with the patient that there may be a patient responsible charge related to this service. The patient expressed understanding and agreed to proceed.  Location patient: home Location provider: work or home office Participants present for the call: patient, provider Patient did not have a visit in the prior 7 days to address this/these issue(s).   History of Present Illness: Pt with sneezing, rhinorrhea, nasal congestion, HA x 3 days.   Eyes were hurting, relived by Tyelnol.  Appetite mildly decreased this am.  Rhinorrhea stopped. Denies sore throat, n/v, diarrhea.  Taking OTC cetirizine x 2 days.  Pt concerned she has a sinus infection.  Pt has not taken a COVID test.  Also has a generic flonase nasal spray but has not started using it.    Observations/Objective: Patient sounds cheerful and well on the phone. I do not appreciate any SOB. Speech and thought processing are grossly intact. Patient reported vitals:  Assessment and Plan: Nasal congestion  Rhinorrhea  Pain of both eyes -discussed possible causes of symptoms including allergic rhinitis, acute nasopharyngitis, COVID-19 viral infection -Patient advised to take a COVID test.  Patient to contact office/leave MyChart message with results. -Continue supportive care including rest, hydration, OTC cough/cold medications, Tylenol.  Okay to take allergy medication as needed -Consider antiviral medication based on COVID results. -Given strict precautions  Follow Up Instructions: F/u prn   [id number] [id number] [id number] 21-30 I did not refer this patient for  an OV in the next 24 hours for this/these issue(s).  I discussed the assessment and treatment plan with the patient. The patient was provided an opportunity to ask questions and all were answered. The patient agreed with the plan and demonstrated an understanding of the instructions.   The patient was advised to call back or seek an in-person evaluation if the symptoms worsen or if the condition fails to improve as anticipated.  I provided 9 minutes of non-face-to-face time during this encounter.   Betty Saint, MD

## 2020-09-13 ENCOUNTER — Encounter: Payer: Self-pay | Admitting: Gastroenterology

## 2020-09-18 DIAGNOSIS — Z1231 Encounter for screening mammogram for malignant neoplasm of breast: Secondary | ICD-10-CM | POA: Diagnosis not present

## 2020-09-18 LAB — HM MAMMOGRAPHY

## 2020-09-19 ENCOUNTER — Ambulatory Visit (INDEPENDENT_AMBULATORY_CARE_PROVIDER_SITE_OTHER): Payer: Medicare Other | Admitting: Orthopaedic Surgery

## 2020-09-19 ENCOUNTER — Telehealth: Payer: Self-pay

## 2020-09-19 ENCOUNTER — Encounter: Payer: Self-pay | Admitting: Orthopaedic Surgery

## 2020-09-19 ENCOUNTER — Other Ambulatory Visit: Payer: Self-pay

## 2020-09-19 DIAGNOSIS — M1711 Unilateral primary osteoarthritis, right knee: Secondary | ICD-10-CM

## 2020-09-19 NOTE — Progress Notes (Signed)
Office Visit Note   Patient: Betty [name]           Date of Birth: 03-01-1945           MRN: [id number] Visit Date: 09/19/2020              Requested by: Deeann Saint, MD [address] [place],  [place] [address] PCP: Deeann Saint, MD   Assessment & Plan: Visit Diagnoses:  1. Primary osteoarthritis of right knee     Plan: Impression is right knee osteoarthritis.  Treatment options discussed today and she is interested and getting approval for Visco injection.  Home strengthening exercises provided.  She will also pick up an over-the-counter knee brace for activity.  This patient is diagnosed with osteoarthritis of the knee(s).    Radiographs show evidence of joint space narrowing, osteophytes, subchondral sclerosis and/or subchondral cysts.  This patient has knee pain which interferes with functional and activities of daily living.    This patient has experienced inadequate response, adverse effects and/or intolerance with conservative treatments such as acetaminophen, NSAIDS, topical creams, physical therapy or regular exercise, knee bracing and/or weight loss.   This patient has experienced inadequate response or has a contraindication to intra articular steroid injections for at least 3 months.   This patient is not scheduled to have a total knee replacement within 6 months of starting treatment with viscosupplementation.  Follow-Up Instructions: No follow-ups on file.   Orders:  No orders of the defined types were placed in this encounter.  No orders of the defined types were placed in this encounter.     Procedures: No procedures performed   Clinical Data: No additional findings.   Subjective: Chief Complaint  Patient presents with   Right Knee - Pain    Betty [name] is following up for chronic right knee pain due to DJD.  She has noticed a lot of retropatellar pain and crackling with standing up from a chair.  Currently not taking medications.  She  walks for exercise and overall is very active.  Uses Voltaren gel on a daily basis.  Previous cortisone injection did give some relief but this has worn off.   Review of Systems  Constitutional: Negative.   HENT: Negative.    Eyes: Negative.   Respiratory: Negative.    Cardiovascular: Negative.   Endocrine: Negative.   Musculoskeletal: Negative.   Neurological: Negative.   Hematological: Negative.   Psychiatric/Behavioral: Negative.    All other systems reviewed and are negative.   Objective: Vital Signs: There were no vitals taken for this visit.  Physical Exam Vitals and nursing note reviewed.  Constitutional:      Appearance: She is well-developed.  Pulmonary:     Effort: Pulmonary effort is normal.  Skin:    General: Skin is warm.     Capillary Refill: Capillary refill takes less than 2 seconds.  Neurological:     Mental Status: She is alert and oriented to person, place, and time.  Psychiatric:        Behavior: Behavior normal.        Thought Content: Thought content normal.        Judgment: Judgment normal.    Ortho Exam Right knee shows trace joint effusion.  Significant patellofemoral crepitus with range of motion.  Collaterals and cruciates are stable.  No joint line tenderness. Specialty Comments:  No specialty comments available.  Imaging: No results found.   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    Gastroesophageal reflux disease 10/21/2018   Hyperlipidemia 10/21/2018   History of pulmonary embolus (PE) - Dx'd 05/2018  06/21/2018   Past Medical History:  Diagnosis Date   Diabetes mellitus without complication (HCC)    borderline   High cholesterol    History of pulmonary embolus (PE) - Dx'd 05/2018  06/21/2018    Family History  Problem Relation Age of Onset   Hypertension Mother    Hypertension Father     Past Surgical History:  Procedure Laterality Date   CESAREAN SECTION     EYE SURGERY     LAPAROSCOPIC APPENDECTOMY N/A 07/06/2019   Procedure: APPENDECTOMY LAPAROSCOPIC, TAP BLOCK;  Surgeon: Karie Soda, MD;  Location: [facility];  Service: General;  Laterality: N/A;   Social History   Occupational History   Occupation: retired    Associate Professor: [employer]  Tobacco Use   Smoking status: Never   Smokeless tobacco: Never  Vaping Use   Vaping Use: Never used  Substance and Sexual Activity   Alcohol use: Never   Drug use: Never   Sexual activity: Not on file

## 2020-09-19 NOTE — Telephone Encounter (Signed)
Noted  

## 2020-09-19 NOTE — Telephone Encounter (Signed)
Please submit for Right knee Synvisc inj

## 2020-09-23 ENCOUNTER — Telehealth: Payer: Self-pay

## 2020-09-23 NOTE — Telephone Encounter (Signed)
error 

## 2020-09-24 ENCOUNTER — Telehealth (INDEPENDENT_AMBULATORY_CARE_PROVIDER_SITE_OTHER): Payer: Medicare Other | Admitting: Family Medicine

## 2020-09-24 ENCOUNTER — Encounter: Payer: Self-pay | Admitting: Family Medicine

## 2020-09-24 ENCOUNTER — Other Ambulatory Visit: Payer: Self-pay

## 2020-09-24 ENCOUNTER — Ambulatory Visit (INDEPENDENT_AMBULATORY_CARE_PROVIDER_SITE_OTHER)
Admission: RE | Admit: 2020-09-24 | Discharge: 2020-09-24 | Disposition: A | Discharge status: Home / Self Care | Payer: Medicare Other | Source: Ambulatory Visit | Attending: Family Medicine | Admitting: Family Medicine

## 2020-09-24 VITALS — BP 130/70 | HR 96 | Temp 99.1°F | Resp 16 | Ht 63.0 in

## 2020-09-24 DIAGNOSIS — J989 Respiratory disorder, unspecified: Secondary | ICD-10-CM | POA: Diagnosis not present

## 2020-09-24 DIAGNOSIS — K219 Gastro-esophageal reflux disease without esophagitis: Secondary | ICD-10-CM | POA: Diagnosis not present

## 2020-09-24 DIAGNOSIS — R062 Wheezing: Secondary | ICD-10-CM

## 2020-09-24 DIAGNOSIS — R059 Cough, unspecified: Secondary | ICD-10-CM | POA: Diagnosis not present

## 2020-09-24 MED ORDER — PREDNISONE 20 MG PO TABS: 40.0000 mg | ORAL_TABLET | Freq: Every day | ORAL | 0 refills | Status: AC

## 2020-09-24 MED ORDER — BENZONATATE 100 MG PO CAPS: 200.0000 mg | ORAL_CAPSULE | Freq: Two times a day (BID) | ORAL | 0 refills | Status: AC | PRN

## 2020-09-24 MED ORDER — ALBUTEROL SULFATE HFA 108 (90 BASE) MCG/ACT IN AERS: 2.0000 | INHALATION_SPRAY | Freq: Four times a day (QID) | RESPIRATORY_TRACT | 0 refills | Status: DC | PRN

## 2020-09-24 MED ORDER — IPRATROPIUM-ALBUTEROL 0.5-2.5 (3) MG/3ML IN SOLN
1.5000 mL | Freq: Once | RESPIRATORY_TRACT | Status: AC
  Administered 2020-09-24: 1.5 mL via RESPIRATORY_TRACT

## 2020-09-24 NOTE — Patient Instructions (Addendum)
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 Albuterol inh 2 puff every 6 hours for a week then as needed for wheezing or shortness of breath.  Plain Mucinex over the counter. Chest X ray today.  Do not use My Chart to request refills or for acute issues that need immediate attention.   Please be sure medication list is accurate. If a new problem present, please set up appointment sooner than planned today.

## 2020-09-24 NOTE — Progress Notes (Signed)
ACUTE VISIT Chief Complaint  Patient presents with   Cough   HPI: [name] is a 75 y.o. female, who is here today complaining of persistent cough. Started with respiratory symptoms 09/09/20. Nasal congestion and rhinorrhea have resolved.  Cough This is a new problem. The current episode started 1 to 4 weeks ago. The problem has been gradually improving. The problem occurs constantly. The cough is Productive of sputum. Associated symptoms include postnasal drip and wheezing. Pertinent negatives include no ear congestion, fever, headaches, hemoptysis, myalgias, nasal congestion, rash, sore throat or weight loss. The symptoms are aggravated by lying down. She has tried OTC cough suppressant for the symptoms. The treatment provided moderate relief. There is no history of asthma or emphysema.   Negative for fever,chills,abnormal wt loss, SOB,exertional CP,or palpation. Mild chest wall pain with coughing spells. + Wheezing. No alleviating factors identified. Cough also exacerbated by activities like eating and talking. Negative for history of asthma or COPD. No history of tobacco use. No sick contact.  When symptoms first started she had a home COVID-19 test done and negative.  Cough is interfering with his sleep. She wants a cough suppressor. OTC cough medication has helped some.  GERD, she is on Omeprazole 20 mg daily. She has not noted heartburn,nausea,or dysphagia. She has a EGD scheduled because has had dysphagia in the past.  Review of Systems  Constitutional:  Positive for fatigue. Negative for activity change, fever and weight loss.  HENT:  Positive for postnasal drip. Negative for sore throat.   Respiratory:  Positive for cough and wheezing. Negative for hemoptysis.   Gastrointestinal:  Negative for abdominal pain.       Negative for changes in bowel habits.  Musculoskeletal:  Negative for gait problem and myalgias.  Skin:  Negative for pallor and rash.   Neurological:  Negative for syncope, weakness and headaches.  Psychiatric/Behavioral:  Positive for sleep disturbance. Negative for confusion.   Rest see pertinent positives and negatives per HPI.  Current Outpatient Medications on File Prior to Visit  Medication Sig Dispense Refill   acetaminophen (TYLENOL) 500 MG tablet Take 2 tablets (1,000 mg total) by mouth every 8 (eight) hours as needed for mild pain. 20 tablet 0   acyclovir ointment (ZOVIRAX) 5 % APPLY  OINTMENT TOPICALLY TO AFFECTED AREA  EVERY 3 HOURS 15 g 0   Ascorbic Acid (VITAMIN C) 500 MG CAPS Take 500 capsules by mouth daily.     aspirin 81 MG EC tablet Take 81 mg by mouth daily. Swallow whole.     Bromfenac Sodium (PROLENSA) 0.07 % SOLN Place 1 drop into the left eye daily.     cholecalciferol (VITAMIN D3) 25 MCG (1000 UNIT) tablet Take 1,000 Units by mouth daily.     Difluprednate (DUREZOL) 0.05 % EMUL Place 1 drop into the left eye daily.     Multiple Vitamin (MULTIVITAMIN WITH MINERALS) TABS tablet Take 1 tablet by mouth daily.     omeprazole (PRILOSEC) 20 MG capsule TAKE 1 CAPSULE BY MOUTH ONCE DAILY -  APPOINTMENT  REQUIRED  FOR  FUTURE  REFILLS 30 capsule 2   pravastatin (PRAVACHOL) 10 MG tablet Take 1 tablet (10 mg total) by mouth daily. 90 tablet 1   vitamin B-12 (CYANOCOBALAMIN) 100 MCG tablet Take 100 mcg by mouth every other day.     No current facility-administered medications on file prior to visit.   Past Medical History:  Diagnosis Date   Diabetes mellitus without complication (  HCC)    borderline   High cholesterol    History of pulmonary embolus (PE) - Dx'd 05/2018  06/21/2018   Allergies  Allergen Reactions   Sulfa Antibiotics Other (See Comments)    bristers   Social History   Socioeconomic History   Marital status: Divorced    Spouse name: Not on file   Number of children: Not on file   Years of education: Not on file   Highest education level: Not on file  Occupational History   Occupation:  retired    Associate Professor: [employer]  Tobacco Use   Smoking status: Never   Smokeless tobacco: Never  Vaping Use   Vaping Use: Never used  Substance and Sexual Activity   Alcohol use: Never   Drug use: Never   Sexual activity: Not on file  Other Topics Concern   Not on file  Social History Narrative   Lived in [place], [place]   Relocated to [place] in Aug 2020   Social Determinants of Health   Financial Resource Strain: Not on file  Food Insecurity: Not on file  Transportation Needs: Not on file  Physical Activity: Not on file  Stress: Not on file  Social Connections: Not on file   Vitals:   09/24/20 1128  BP: 130/70  Pulse: 96  Resp: 16  Temp: 99.1 F (37.3 C)   Body mass index is 26.43 kg/m.  Physical Exam Vitals and nursing note reviewed.  Constitutional:      General: She is not in acute distress.    Appearance: She is well-developed. She is not ill-appearing.  HENT:     Head: Normocephalic and atraumatic.     Right Ear: Tympanic membrane, ear canal and external ear normal.     Left Ear: Tympanic membrane, ear canal and external ear normal.     Nose: Rhinorrhea present.     Right Turbinates: Not enlarged.     Left Turbinates: Not enlarged.     Mouth/Throat:     Mouth: Mucous membranes are moist.     Pharynx: Oropharynx is clear.     Comments: Postnasal drainage. Eyes:     Conjunctiva/sclera: Conjunctivae normal.  Cardiovascular:     Rate and Rhythm: Normal rate and regular rhythm.     Heart sounds: No murmur heard. Pulmonary:     Effort: Pulmonary effort is normal. No respiratory distress.     Breath sounds: Decreased air movement present. No stridor. Wheezing present. No rhonchi or rales.  Lymphadenopathy:     Cervical: No cervical adenopathy.  Skin:    General: Skin is warm.     Findings: No erythema or rash.  Neurological:     General: No focal deficit present.     Mental Status: She is alert and oriented to person, place, and time.  Psychiatric:      Comments: Well groomed, good eye contact.   ASSESSMENT AND PLAN:  Betty [name] was seen today for cough.  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 Wheezing and ventilation greatly improved after DuoNeb treatment here in the office, which she tolerated well and given after verbal consent. She will continue albuterol 2 puff every 6 hours for a week and then as needed for wheezing. 3 to 5 days of prednisone recommended, we discussed some side effects, recommended taking it with breakfast. Instructed about warning signs. Follow-up with PCP  in 2 weeks, before if needed.  -     albuterol (VENTOLIN HFA) 108 (90 Base) MCG/ACT inhaler; Inhale 2 puffs into the lungs every 6 (six) hours as needed for wheezing or shortness of breath. -     predniSONE (DELTASONE) 20 MG tablet; Take 2 tablets (40 mg total) by mouth daily with breakfast for 5 days. -     ipratropium-albuterol (DUONEB) 0.5-2.5 (3) MG/3ML nebulizer solution 1.5 mL  Cough We discussed possible etiologies. Improved with DuoNeb treatment here in the office. Benzonatate for symptomatic treatment. Hydration. Further recommendation will be given according to chest x-ray results.  -     benzonatate (TESSALON) 100 MG capsule; Take 2 capsules (200 mg total) by mouth 2 (two) times daily as needed for up to 10 days. -     ipratropium-albuterol (DUONEB) 0.5-2.5 (3) MG/3ML nebulizer solution 1.5 mL -     DG Chest 2 View; Future  Gastroesophageal reflux disease, unspecified whether esophagitis present This problem could be a contributing factor. For now continue omeprazole 20 mg daily. GERD precautions. If cough is persistent, omeprazole can be increased to 40 mg.  Return in about 2 weeks (around 10/08/2020).  Noriel Guthrie G. [place], MD  [facility]. [place] office.

## 2020-09-25 ENCOUNTER — Other Ambulatory Visit: Payer: Self-pay | Admitting: Family Medicine

## 2020-09-25 DIAGNOSIS — K219 Gastro-esophageal reflux disease without esophagitis: Secondary | ICD-10-CM

## 2020-10-08 ENCOUNTER — Other Ambulatory Visit: Payer: Self-pay

## 2020-10-09 ENCOUNTER — Encounter: Payer: Self-pay | Admitting: Family Medicine

## 2020-10-09 ENCOUNTER — Ambulatory Visit (INDEPENDENT_AMBULATORY_CARE_PROVIDER_SITE_OTHER): Payer: Medicare Other | Admitting: Family Medicine

## 2020-10-09 VITALS — BP 122/80 | HR 85 | Temp 99.1°F | Wt 146.6 lb

## 2020-10-09 DIAGNOSIS — R058 Other specified cough: Secondary | ICD-10-CM

## 2020-10-09 DIAGNOSIS — J329 Chronic sinusitis, unspecified: Secondary | ICD-10-CM

## 2020-10-09 DIAGNOSIS — J989 Respiratory disorder, unspecified: Secondary | ICD-10-CM

## 2020-10-09 NOTE — Progress Notes (Signed)
Subjective:    Patient ID: Betty [name], female    DOB: [birth year], 75 y.o.   MRN: [id number]  Chief Complaint  Patient presents with   Follow-up    Cough, seen by Dr Sw[place]and     HPI Patient was seen today for f/u from [facility] 09/24/20 for post viral cough/reactive airway dz.  Pt notes improvement in symptoms.  Still has an occasional cough.  No longer using albuterol inhaler as caused a HA.  Completed prednisone course and tessalon.  Past Medical History:  Diagnosis Date   Diabetes mellitus without complication (HCC)    borderline   High cholesterol    History of pulmonary embolus (PE) - Dx'd 05/2018  06/21/2018    Allergies  Allergen Reactions   Sulfa Antibiotics Other (See Comments)    bristers    ROS General: Denies fever, chills, night sweats, changes in weight, changes in appetite HEENT: Denies headaches, ear pain, changes in vision, rhinorrhea, sore throat CV: Denies CP, palpitations, SOB, orthopnea Pulm: Denies SOB, wheezing  +cough GI: Denies abdominal pain, nausea, vomiting, diarrhea, constipation GU: Denies dysuria, hematuria, frequency, vaginal discharge Msk: Denies muscle cramps, joint pains Neuro: Denies weakness, numbness, tingling Skin: Denies rashes, bruising Psych: Denies depression, anxiety, hallucinations     Objective:    Blood pressure 122/80, pulse 85, temperature 99.1 F (37.3 C), temperature source Oral, weight 146 lb 9.6 oz (66.5 kg), SpO2 92 %.  Gen. Pleasant, well-nourished, in no distress, normal affect   HEENT: Naomi/AT, face symmetric, conjunctiva clear, no scleral icterus, PERRLA, EOMI, nares patent without drainage, pharynx with postnasal drainage, no erythema or exudate.  TMs normal bilaterally. Lungs: no accessory muscle use, CTAB, no wheezes or rales Cardiovascular: RRR, no m/r/g, no peripheral edema Musculoskeletal: No deformities, no cyanosis or clubbing, normal tone Neuro:  A&Ox3, CN II-XII intact, normal gait Skin:  Warm, no lesions/  rash  Wt Readings from Last 3 Encounters:  10/09/20 146 lb 9.6 oz (66.5 kg)  06/13/20 149 lb 3.2 oz (67.7 kg)  05/13/20 148 lb 3.2 oz (67.2 kg)    Lab Results  Component Value Date   WBC 5.8 06/06/2020   HGB 15.3 (H) 06/06/2020   HCT 45.1 06/06/2020   PLT 223.0 06/06/2020   GLUCOSE 84 06/06/2020   CHOL 279 (H) 06/06/2020   TRIG 236.0 (H) 06/06/2020   HDL 49.20 06/06/2020   LDLDIRECT 204.0 06/06/2020   LDLCALC 93 10/20/2019   ALT 15 06/06/2020   AST 18 06/06/2020   NA 141 06/06/2020   K 4.1 06/06/2020   CL 104 06/06/2020   CREATININE 0.85 06/06/2020   BUN 11 06/06/2020   CO2 30 06/06/2020   TSH 4.33 06/06/2020   HGBA1C 5.9 06/06/2020    Assessment/Plan:  Post-viral cough syndrome -improving -continue supportive care prn -start OTC antihistamine such as Claritin, Zyrtec, or Allegra to help -advised on possible duration of cough.  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

## 2020-10-14 ENCOUNTER — Other Ambulatory Visit: Payer: Self-pay

## 2020-10-14 ENCOUNTER — Ambulatory Visit: Payer: Medicare Other | Admitting: Family Medicine

## 2020-10-21 ENCOUNTER — Encounter: Payer: Medicare Other | Admitting: Family Medicine

## 2020-10-22 ENCOUNTER — Telehealth: Payer: Self-pay

## 2020-10-22 ENCOUNTER — Other Ambulatory Visit: Payer: Self-pay

## 2020-10-22 NOTE — Telephone Encounter (Signed)
VOB submitted for SynviscOne, right knee. Pending BV. 

## 2020-10-23 ENCOUNTER — Encounter: Payer: Self-pay | Admitting: Family Medicine

## 2020-10-23 ENCOUNTER — Ambulatory Visit (INDEPENDENT_AMBULATORY_CARE_PROVIDER_SITE_OTHER): Payer: Medicare Other | Admitting: Family Medicine

## 2020-10-23 VITALS — BP 126/82 | HR 76 | Temp 98.7°F | Resp 16 | Ht 63.0 in | Wt 148.8 lb

## 2020-10-23 DIAGNOSIS — E782 Mixed hyperlipidemia: Secondary | ICD-10-CM | POA: Diagnosis not present

## 2020-10-23 DIAGNOSIS — R7303 Prediabetes: Secondary | ICD-10-CM

## 2020-10-23 DIAGNOSIS — Z0000 Encounter for general adult medical examination without abnormal findings: Secondary | ICD-10-CM | POA: Diagnosis not present

## 2020-10-23 DIAGNOSIS — R1011 Right upper quadrant pain: Secondary | ICD-10-CM | POA: Diagnosis not present

## 2020-10-23 LAB — LIPID PANEL
Cholesterol: 240 mg/dL — ABNORMAL HIGH (ref 0–200)
HDL: 51.9 mg/dL (ref >39.00)
LDL Cholesterol: 150 mg/dL — ABNORMAL HIGH (ref 0–99)
NonHDL: 188.43
Total CHOL/HDL Ratio: 5
Triglycerides: 190 mg/dL — ABNORMAL HIGH (ref 0.0–149.0)
VLDL: 38 mg/dL (ref 0.0–40.0)

## 2020-10-23 LAB — COMPREHENSIVE METABOLIC PANEL
ALT: 15 U/L (ref 0–35)
AST: 21 U/L (ref 0–37)
Albumin: 4.2 g/dL (ref 3.5–5.2)
Alkaline Phosphatase: 54 U/L (ref 39–117)
BUN: 7 mg/dL (ref 6–23)
CO2: 29 mEq/L (ref 19–32)
Calcium: 9.7 mg/dL (ref 8.4–10.5)
Chloride: 106 mEq/L (ref 96–112)
Creatinine, Ser: 0.82 mg/dL (ref 0.40–1.20)
GFR: 69.27 mL/min (ref >60.00)
Glucose, Bld: 84 mg/dL (ref 70–99)
Potassium: 3.9 mEq/L (ref 3.5–5.1)
Sodium: 141 mEq/L (ref 135–145)
Total Bilirubin: 0.7 mg/dL (ref 0.2–1.2)
Total Protein: 7.1 g/dL (ref 6.0–8.3)

## 2020-10-23 LAB — TSH: TSH: 3.35 u[IU]/mL (ref 0.35–5.50)

## 2020-10-23 LAB — CBC WITH DIFFERENTIAL/PLATELET
Basophils Absolute: 0.1 10*3/uL (ref 0.0–0.1)
Basophils Relative: 1.2 % (ref 0.0–3.0)
Eosinophils Absolute: 0.1 10*3/uL (ref 0.0–0.7)
Eosinophils Relative: 2.6 % (ref 0.0–5.0)
HCT: 43.7 % (ref 36.0–46.0)
Hemoglobin: 14.5 g/dL (ref 12.0–15.0)
Lymphocytes Relative: 52.5 % — ABNORMAL HIGH (ref 12.0–46.0)
Lymphs Abs: 2.6 10*3/uL (ref 0.7–4.0)
MCHC: 33.1 g/dL (ref 30.0–36.0)
MCV: 91.3 fl (ref 78.0–100.0)
Monocytes Absolute: 0.6 10*3/uL (ref 0.1–1.0)
Monocytes Relative: 12 % (ref 3.0–12.0)
Neutro Abs: 1.6 10*3/uL (ref 1.4–7.7)
Neutrophils Relative %: 31.7 % — ABNORMAL LOW (ref 43.0–77.0)
Platelets: 226 10*3/uL (ref 150.0–400.0)
RBC: 4.78 Mil/uL (ref 3.87–5.11)
RDW: 12.8 % (ref 11.5–15.5)
WBC: 5 10*3/uL (ref 4.0–10.5)

## 2020-10-23 LAB — MICROALBUMIN / CREATININE URINE RATIO
Creatinine,U: 127 mg/dL
Microalb Creat Ratio: 0.6 mg/g (ref 0.0–30.0)
Microalb, Ur: <0.7 mg/dL (ref 0.0–1.9)

## 2020-10-23 LAB — HEMOGLOBIN A1C: Hgb A1c MFr Bld: 6 % (ref 4.6–6.5)

## 2020-10-23 LAB — T4, FREE: Free T4: 0.64 ng/dL (ref 0.60–1.60)

## 2020-10-23 NOTE — Progress Notes (Signed)
Subjective:     Betty [name] is a 75 y.o. female and is here for a comprehensive physical exam. The patient reports R abd pain.  Noted after trip to [facility] over the wknd.  Pt endorses legs being sore after walking on the beach.  Pt has an appt with GI this wk.  She considered cancelling the appt as she noted improvement in symptoms of dysphagia since taking omeprazole 20 mg daily.  Pt inquires about shingles vaccine.  Had shingles last yr.  Pt had mammogram this yr.  Inquires about finding a Theatre manager.   Social History   Socioeconomic History   Marital status: Divorced    Spouse name: Not on file   Number of children: Not on file   Years of education: Not on file   Highest education level: Not on file  Occupational History   Occupation: retired    Associate Professor: [employer]  Tobacco Use   Smoking status: Never   Smokeless tobacco: Never  Vaping Use   Vaping Use: Never used  Substance and Sexual Activity   Alcohol use: Never   Drug use: Never   Sexual activity: Not on file  Other Topics Concern   Not on file  Social History Narrative   Lived in [place], [place]   Relocated to [place] in Aug 2020   Social Determinants of Health   Financial Resource Strain: Not on file  Food Insecurity: Not on file  Transportation Needs: Not on file  Physical Activity: Not on file  Stress: Not on file  Social Connections: Not on file  Intimate Partner Violence: Not on file   Health Maintenance  Topic Date Due   TETANUS/TDAP  Never done   Zoster Vaccines- Shingrix (1 of 2) Never done   PNA vac Low Risk Adult (1 of 2 - PCV13) Never done   COVID-19 Vaccine (4 - Booster for Pfizer series) 08/13/2020   INFLUENZA VACCINE  09/23/2020   HEMOGLOBIN A1C  12/06/2020   DEXA SCAN  Completed   Hepatitis C Screening  Completed   HPV VACCINES  Aged Out   FOOT EXAM  Discontinued   OPHTHALMOLOGY EXAM  Discontinued   URINE MICROALBUMIN  Discontinued    The following portions of the patient's history were  reviewed and updated as appropriate: allergies, current medications, past family history, past medical history, past social history, past surgical history, and problem list.  Review of Systems Pertinent items noted in HPI and remainder of comprehensive ROS otherwise negative.   Objective:    BP 126/82   Pulse 76   Temp 98.7 F (37.1 C)   Resp 16   Ht 5\' 3"  (1.6 m)   Wt 148 lb 12.8 oz (67.5 kg)   SpO2 97%   BMI 26.36 kg/m  General appearance: alert, cooperative, and no distress Head: Normocephalic, without obvious abnormality, atraumatic Eyes: conjunctivae/corneas clear. PERRL, EOM's intact. Fundi benign. Ears: normal TM's and external ear canals both ears Nose: Nares normal. Septum midline. Mucosa normal. No drainage or sinus tenderness. Throat: lips, mucosa, and tongue normal; teeth and gums normal Neck: no adenopathy, no carotid bruit, no JVD, supple, symmetrical, trachea midline, and thyroid not enlarged, symmetric, no tenderness/mass/nodules Lungs: clear to auscultation bilaterally Heart: regular rate and rhythm, S1, S2 normal, no murmur, click, rub or gallop Abdomen: soft, non-tender; bowel sounds normal; no masses,  no organomegaly Extremities: extremities normal, atraumatic, no cyanosis or edema Pulses: 2+ and symmetric Skin: Skin color, texture, turgor normal. No rashes or lesions  Lymph nodes: Cervical, supraclavicular, and axillary nodes normal. Neurologic: Alert and oriented X 3, normal strength and tone. Normal symmetric reflexes. Normal coordination and gait    Assessment:    Healthy female exam.      Plan:    Anticipatory guidance given including wearing seatbelts, smoke detectors in the home, increasing physical activity, increasing p.o. intake of water and vegetables. -will obtain labs -mammogram up to date, done 09/18/20 -[place].  Not indicated.   -discussed immunizations including influenza, shingles, and pneumonia.  Pt declines at  this time. See After Visit Summary for Counseling Recommendations   Right upper quadrant abdominal pain  -Encouraged to keep follow-up with gastroenterology this week. - Plan: CBC with Differential/Platelet, CMP  Mixed hyperlipidemia -Lifestyle modifications -Continue pravastatin 10 mg daily - Plan: Lipid panel  Prediabetes - Plan: Hemoglobin A1c, Microalbumin/Creatinine Ratio, Urine  F/u in prn for continued or worsening abdominal pain.  Abbe Amsterdam, MD

## 2020-10-24 ENCOUNTER — Ambulatory Visit: Payer: Medicare Other | Admitting: Gastroenterology

## 2020-11-04 ENCOUNTER — Telehealth: Payer: Self-pay

## 2020-11-04 NOTE — Telephone Encounter (Signed)
Talked with patient concerning gel injection for right knee and patient stated that she would like to hold off for the moment.  Will CB when she is ready to proceed.  Approved for SynviscOne, right knee. Buy & Bill Patient will be responsible for 20% OOP. Co-pay of $35.00 No PA required

## 2020-12-21 ENCOUNTER — Other Ambulatory Visit: Payer: Self-pay | Admitting: Family Medicine

## 2020-12-21 DIAGNOSIS — E782 Mixed hyperlipidemia: Secondary | ICD-10-CM

## 2020-12-31 ENCOUNTER — Other Ambulatory Visit: Payer: Self-pay

## 2020-12-31 ENCOUNTER — Ambulatory Visit: Payer: Medicare Other | Admitting: Orthopaedic Surgery

## 2020-12-31 DIAGNOSIS — M1711 Unilateral primary osteoarthritis, right knee: Secondary | ICD-10-CM | POA: Diagnosis not present

## 2020-12-31 NOTE — Progress Notes (Signed)
   Office Visit Note   Patient: Betty [name]           Date of Birth: May 14, 1945           MRN: [id number] Visit Date: 12/31/2020              Requested by: Deeann Saint, MD [address] [place],  [place] [address] PCP: Deeann Saint, MD   Assessment & Plan: Visit Diagnoses:  1. Unilateral primary osteoarthritis, right knee     Plan: Impression is right knee osteoarthritis.  Today, proceed with Synvisc 1 injections right knee.  She tolerated this well.  Follow-up with [place] as needed.  Follow-Up Instructions: Return if symptoms worsen or fail to improve.   Orders:  Orders Placed This Encounter  Procedures   Large Joint Inj: R knee   No orders of the defined types were placed in this encounter.     Procedures: Large Joint Inj: R knee on 12/31/2020 9:52 AM Indications: pain Details: 22 G needle, anterolateral approach Medications: 2 mL lidocaine 1 %; 2 mL bupivacaine 0.25 %; 48 mg Hylan 48 MG/6ML     Clinical Data: No additional findings.   Subjective: Chief Complaint  Patient presents with   Right Knee - Pain    HPI patient is a pleasant 75 year old female with underlying right knee osteoarthritis who comes in today for first Synvisc 1 injection to the right knee.  She is previously had cortisone injections which helped for about 6 months.     Objective: Vital Signs: There were no vitals taken for this visit.    Ortho Exam stable right knee exam  Specialty Comments:  No specialty comments available.  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   Gastroesophageal reflux disease 10/21/2018   Hyperlipidemia 10/21/2018   History of pulmonary embolus (PE) - Dx'd 05/2018  06/21/2018   Past Medical History:  Diagnosis Date   Diabetes mellitus without complication (HCC)    borderline   High cholesterol    History of pulmonary embolus (PE) - Dx'd 05/2018  06/21/2018    Family History  Problem Relation Age of Onset   Hypertension Mother    Hypertension Father     Past Surgical History:  Procedure Laterality Date   CESAREAN SECTION     EYE SURGERY     LAPAROSCOPIC APPENDECTOMY N/A 07/06/2019   Procedure: APPENDECTOMY LAPAROSCOPIC, TAP BLOCK;  Surgeon: Karie Soda, MD;  Location: [facility];  Service: General;  Laterality: N/A;   Social History   Occupational History   Occupation: retired    Associate Professor: [employer]  Tobacco Use   Smoking status: Never   Smokeless tobacco: Never  Vaping Use   Vaping Use: Never used  Substance and Sexual Activity   Alcohol use: Never   Drug use: Never   Sexual activity: Not on file

## 2021-01-01 MED ORDER — LIDOCAINE HCL 1 % IJ SOLN
2.0000 mL | INTRAMUSCULAR | Status: AC | PRN
Start: 2020-12-31 — End: 2020-12-31
  Administered 2020-12-31: 2 mL

## 2021-01-01 MED ORDER — BUPIVACAINE HCL 0.25 % IJ SOLN
2.0000 mL | INTRAMUSCULAR | Status: AC | PRN
  Administered 2020-12-31: 2 mL via INTRA_ARTICULAR

## 2021-01-01 MED ORDER — HYLAN G-F 20 48 MG/6ML IX SOSY
48.0000 mg | PREFILLED_SYRINGE | INTRA_ARTICULAR | Status: AC | PRN
  Administered 2020-12-31: 48 mg via INTRA_ARTICULAR

## 2021-03-28 ENCOUNTER — Ambulatory Visit (INDEPENDENT_AMBULATORY_CARE_PROVIDER_SITE_OTHER): Payer: Medicare Other | Admitting: Family Medicine

## 2021-03-28 ENCOUNTER — Telehealth: Payer: Self-pay

## 2021-03-28 ENCOUNTER — Other Ambulatory Visit: Payer: Self-pay

## 2021-03-28 VITALS — BP 118/80 | HR 74 | Temp 97.7°F | Ht 63.0 in | Wt 144.4 lb

## 2021-03-28 DIAGNOSIS — J309 Allergic rhinitis, unspecified: Secondary | ICD-10-CM

## 2021-03-28 DIAGNOSIS — H6121 Impacted cerumen, right ear: Secondary | ICD-10-CM

## 2021-03-28 DIAGNOSIS — R42 Dizziness and giddiness: Secondary | ICD-10-CM | POA: Diagnosis not present

## 2021-03-28 MED ORDER — MECLIZINE HCL 25 MG PO TABS: 25.0000 mg | ORAL_TABLET | Freq: Three times a day (TID) | ORAL | 0 refills | Status: DC | PRN

## 2021-03-28 NOTE — Progress Notes (Signed)
[facility] [address] [place] [address] Dept: [phone] Dept Fax: [phone]  Office Visit  Subjective:    Patient ID: Betty Betty [name], female    DOB: 1945-11-07, 76 y.o..   MRN: [id number]  Chief Complaint  Patient presents with   Acute Visit    C/o having some dizziness and confusion this morning.      History of Present Illness:  Patient is in today for evaluation of an acute episode of vertigo this morning. She notes that she ahs had vertigo for many years. She states that frequently, she ha some mild symptoms of this after she sits up in bed from sleep, but that it passes quickly and then she is fine. This morning's episode did not pass and she staggered, holding on to the wall, to get to the bathroom. She then went to her front door, opened it, and stood there breathing fresh air for a few minutes. As the symptoms continued, she made her way tot he kitchen and had a cup of tea. She notes that over the last several hours, the vertigo has receded. She notes when she first got up, she felt confused about what was happening for a bit. She did not have any weakness or numbness in either arm or legs and had no dysarthria (her daughter, who accompanied her, confirms this). She does have a history of a fullness in her right ear when lying down, esp. oin her right side. She finds this causes her to switch position in her sleep. She also has issues with possible sinus trouble, noting periodic rhinorrhea. She did not have these issues when she lived in [place], but these have surfaced since moving to [facility].  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   Gastroesophageal reflux disease 10/21/2018   Hyperlipidemia 10/21/2018   History of pulmonary embolus (PE) - Dx'd 05/2018  06/21/2018   Past Surgical History:  Procedure Laterality Date   CESAREAN SECTION     EYE SURGERY     LAPAROSCOPIC APPENDECTOMY N/A 07/06/2019   Procedure: APPENDECTOMY LAPAROSCOPIC, TAP BLOCK;  Surgeon: Michael Boston, MD;  Location: [facility];  Service: General;  Laterality: N/A;   Family History  Problem Relation Age of Onset   Hypertension Mother    Hypertension Father    Outpatient Medications Prior to Visit  Medication Sig Dispense Refill   acetaminophen (TYLENOL) 500 MG tablet Take 2 tablets (1,000 mg total) by mouth every 8 (eight) hours as needed for mild pain. 20 tablet 0   Ascorbic Acid (VITAMIN C) 500 MG CAPS Take 500 capsules by mouth daily.     aspirin 81 MG EC tablet Take 81 mg by mouth daily. Swallow whole.     carboxymethylcellulose (REFRESH PLUS) 0.5 % SOLN 1 drop 3 (three) times daily as needed.     cholecalciferol (VITAMIN D3) 25 MCG (1000 UNIT) tablet Take 1,000 Units by mouth daily.     Multiple Vitamin (MULTIVITAMIN WITH  MINERALS) TABS tablet Take 1 tablet by mouth daily.     omeprazole (PRILOSEC) 20 MG capsule Take 1 capsule by mouth once daily 90 capsule 1   pravastatin (PRAVACHOL) 10 MG tablet Take 1 tablet by mouth once daily 90 tablet 2   vitamin B-12 (CYANOCOBALAMIN) 100 MCG tablet Take 100 mcg by mouth every other day.     Bromfenac Sodium (PROLENSA) 0.07 % SOLN Place 1 drop into the left eye daily.     acyclovir ointment (ZOVIRAX) 5 % APPLY  OINTMENT TOPICALLY TO AFFECTED AREA  EVERY 3 HOURS (Patient not taking: Reported on 03/28/2021) 15 g 0   albuterol (VENTOLIN HFA)  108 (90 Base) MCG/ACT inhaler Inhale 2 puffs into the lungs every 6 (six) hours as needed for wheezing or shortness of breath. 8 g 0   Difluprednate (DUREZOL) 0.05 % EMUL Place 1 drop into the left eye daily.     No facility-administered medications prior to visit.   Allergies  Allergen Reactions   Sulfa Antibiotics Other (See Comments)    bristers    Objective:   Today's Vitals   03/28/21 1305  BP: 118/80  Pulse: 74  Temp: 97.7 F (36.5 C)  TempSrc: Temporal  Weight: 144 lb 6.4 oz (65.5 kg)  Height: 5' 3"  (1.6 m)   Body mass index is 25.58 kg/m.   General: Well developed, well nourished. No acute distress. HEENT: Normocephalic, non-traumatic. PERRL, EOMI. Conjunctiva clear. External ears normal. Right   EAC is impacted with wax. TMs normal bilaterally. Betty [name] with moderate congestion, but scant rhinorrhea.   Mucous membranes moist. Oropharynx clear. Good dentition. Neck: Supple. No lymphadenopathy. No thyromegaly. Lungs: Clear to auscultation bilaterally. No wheezing, rales or rhonchi. CV: RRR without murmurs or rubs. Pulses 2+ bilaterally. Neuro: CN II-XII intact. Normal sensation and strength bilaterally. Psych: Alert and oriented. Normal mood and affect.  Health Maintenance Due  Topic Date Due   Pneumonia Vaccine 34+ Years old (1 - PCV) Never done   TETANUS/TDAP  Never done   Zoster Vaccines- Shingrix (1 of 2) Never done   COVID-19 Vaccine (4 - Booster for Pfizer series) 06/10/2020   INFLUENZA VACCINE  Never done     Assessment & Plan:   1. Vertigo Betty Betty [name]'s episode sounds like vertigo that was more prolonged than her usual. I see no physical signs of a neurological deficit that might suggest a CVA/TIA. She has normal BP and is on a daily aspirin. She ahs no signs of tinnitus or hearing loss to suggest Meniere's disease. I will provide meclizine and have her follow-up with Betty Betty [name] next week. I did advise her that if this recurred over the weekend, she might consider  an ER visit (for possible neuroimaging).  - meclizine (ANTIVERT) 25 MG tablet; Take 1 tablet (25 mg total) by mouth 3 (three) times daily as needed for dizziness.  Dispense: 30 tablet; Refill: 0  2. Impacted cerumen of right ear Recommend she use an ear wax kit OTC to address her wax issue. This may be in part the cause of ear fullness that she has felt.  3. Allergic rhinitis, unspecified seasonality, unspecified trigger The nasal exam suggests mild allergic rhinitis. She could use OTC meds for this.  Haydee Salter, MD

## 2021-03-28 NOTE — Telephone Encounter (Signed)
caller states that she would like to seen by the dr, she states that she was having difficult walking and is very dizzy.  Several attempts made to call pt at 0957, 1013, 1017 by Access Nurse.  03/28/21 1119: Pt states she is not feeling "as dizzy". Unable to drive. Started this am when she woke up. No history of HTN. Appt made with Dr Vickii Penna in [place] for today.

## 2021-04-04 ENCOUNTER — Ambulatory Visit (INDEPENDENT_AMBULATORY_CARE_PROVIDER_SITE_OTHER): Payer: Medicare Other | Admitting: Family Medicine

## 2021-04-04 ENCOUNTER — Encounter: Payer: Self-pay | Admitting: Family Medicine

## 2021-04-04 VITALS — BP 128/82 | HR 64 | Resp 16 | Wt 145.0 lb

## 2021-04-04 DIAGNOSIS — R42 Dizziness and giddiness: Secondary | ICD-10-CM | POA: Diagnosis not present

## 2021-04-04 DIAGNOSIS — H6501 Acute serous otitis media, right ear: Secondary | ICD-10-CM | POA: Diagnosis not present

## 2021-04-04 DIAGNOSIS — J309 Allergic rhinitis, unspecified: Secondary | ICD-10-CM

## 2021-04-04 MED ORDER — FLUTICASONE PROPIONATE 50 MCG/ACT NA SUSP: 1.0000 | Freq: Every day | NASAL | 6 refills | Status: AC

## 2021-04-04 MED ORDER — AMOXICILLIN-POT CLAVULANATE 400-57 MG/5ML PO SUSR: 520.0000 mg | Freq: Two times a day (BID) | ORAL | 0 refills | Status: AC

## 2021-04-04 NOTE — Progress Notes (Signed)
Subjective:    Patient ID: Betty [name], female    DOB: September 20, 1945, 76 y.o.   MRN: [id number]  Chief Complaint  Patient presents with   Follow-up    1 week f/u for dizziness. Reports taking meclizine a couple times. Reports confusion of when to take it. She reports taking it before bed since most episodes come in AM--no change in sx. Also reports trying OTC wax kit about 4x but hasnt felt change or gotten any to come out.     HPI Patient was seen today for follow-up on acute concern.  Patient endorses dizziness x1 week.  Seen at [facility], given meclizine and advised to get OTC ear wax kit.  Patient states she was confused as how to take meclizine.  Typically had dizziness first thing in the morning, so took medication at night.  May have used 2 doses.  Patient notes improvement in vertigo symptoms.  Patient got an over-the-counter earwax removal kit.  Does not really think she got any wax out of her R ear.  States R ear feels clogged/full especially when laying on her R side at night.  Had some popping in the ear.  Tried saline nasal rinse.   Pt does not do well with swallowing large pills.  Past Medical History:  Diagnosis Date   Diabetes mellitus without complication (HCC)    borderline   High cholesterol    History of pulmonary embolus (PE) - Dx'd 05/2018  06/21/2018    Allergies  Allergen Reactions   Sulfa Antibiotics Other (See Comments)    bristers    ROS General: Denies fever, chills, night sweats, changes in weight, changes in appetite +dizziness HEENT: Denies headaches, ear pain, changes in vision, rhinorrhea, sore throat + R ear feels clogged. CV: Denies CP, palpitations, SOB, orthopnea Pulm: Denies SOB, cough, wheezing GI: Denies abdominal pain, nausea, vomiting, diarrhea, constipation GU: Denies dysuria, hematuria, frequency, vaginal discharge Msk: Denies muscle cramps, joint pains Neuro: Denies weakness, numbness, tingling Skin: Denies rashes,  bruising Psych: Denies depression, anxiety, hallucinations     Objective:    Blood pressure 128/82, pulse 64, resp. rate 16, weight 145 lb (65.8 kg).  Gen. Pleasant, well-nourished, in no distress, normal affect   HEENT: [place]/AT, face symmetric, conjunctiva clear, no scleral icterus, PERRLA, EOMI, nares patent without drainage, pharynx without erythema or exudate.  L external ear, canal, and TM normal.  R external ear and canal normal.  R TM dull yellow in color, retracted in center with fullness surrounding outer edges. Lungs: no accessory muscle use, CTAB, no wheezes or rales Cardiovascular: RRR, no m/r/g, no peripheral edema Musculoskeletal: No deformities, no cyanosis or clubbing, normal tone Neuro:  A&Ox3, CN II-XII intact, normal gait Skin:  Warm, no lesions/ rash   Wt Readings from Last 3 Encounters:  04/04/21 145 lb (65.8 kg)  03/28/21 144 lb 6.4 oz (65.5 kg)  10/23/20 148 lb 12.8 oz (67.5 kg)    Lab Results  Component Value Date   WBC 5.0 10/23/2020   HGB 14.5 10/23/2020   HCT 43.7 10/23/2020   PLT 226.0 10/23/2020   GLUCOSE 84 10/23/2020   CHOL 240 (H) 10/23/2020   TRIG 190.0 (H) 10/23/2020   HDL 51.90 10/23/2020   LDLDIRECT 204.0 06/06/2020   LDLCALC 150 (H) 10/23/2020   ALT 15 10/23/2020   AST 21 10/23/2020   NA 141 10/23/2020   K 3.9 10/23/2020   CL 106 10/23/2020   CREATININE 0.82 10/23/2020   BUN 7 10/23/2020  CO2 29 10/23/2020   TSH 3.35 10/23/2020   HGBA1C 6.0 10/23/2020   MICROALBUR <0.7 10/23/2020    Assessment/Plan:  Right acute serous otitis media, recurrence not specified -New problem -Start abx -Tylenol or NSAIDs for any pain/discomfort -Okay to continue using nasal saline rinse or Flonase nasal spray. - Plan: amoxicillin-clavulanate (AUGMENTIN) 400-57 MG/5ML suspension  Vertigo -Symptoms resolved -For return of symptoms meclizine as needed -Dix-Hallpike maneuver if needed -For increased or worsening symptoms consider vestibular  rehab  - Plan: fluticasone (FLONASE) 50 MCG/ACT nasal spray  Allergic rhinitis, unspecified seasonality, unspecified trigger -Discussed OTC antihistamine -Can continue saline nasal rinse. -Start Flonase - Plan: fluticasone (FLONASE) 50 MCG/ACT nasal spray  F/u as needed  Grier Mitts, MD

## 2021-05-13 LAB — HM DIABETES EYE EXAM

## 2021-05-19 ENCOUNTER — Ambulatory Visit (INDEPENDENT_AMBULATORY_CARE_PROVIDER_SITE_OTHER): Payer: Medicare Other | Admitting: Family Medicine

## 2021-05-19 ENCOUNTER — Other Ambulatory Visit: Payer: Self-pay | Admitting: Family Medicine

## 2021-05-19 ENCOUNTER — Encounter: Payer: Self-pay | Admitting: Family Medicine

## 2021-05-19 VITALS — BP 116/72 | HR 64 | Temp 96.9°F | Ht 63.0 in | Wt 143.0 lb

## 2021-05-19 DIAGNOSIS — B009 Herpesviral infection, unspecified: Secondary | ICD-10-CM

## 2021-05-19 DIAGNOSIS — E782 Mixed hyperlipidemia: Secondary | ICD-10-CM

## 2021-05-19 DIAGNOSIS — K219 Gastro-esophageal reflux disease without esophagitis: Secondary | ICD-10-CM | POA: Diagnosis not present

## 2021-05-19 DIAGNOSIS — Z7901 Long term (current) use of anticoagulants: Secondary | ICD-10-CM

## 2021-05-19 DIAGNOSIS — Z86711 Personal history of pulmonary embolism: Secondary | ICD-10-CM

## 2021-05-19 DIAGNOSIS — R7303 Prediabetes: Secondary | ICD-10-CM

## 2021-05-19 MED ORDER — ACYCLOVIR 5 % EX OINT: TOPICAL_OINTMENT | CUTANEOUS | 11 refills | Status: DC

## 2021-05-19 NOTE — Progress Notes (Signed)
?[facility] ?[place] ?[place] [address] ?Dept: [phone] ?Dept Fax: [phone] ? ?Transfer of Care Office Visit ? ?Subjective:  ? ? Patient ID: Betty [name], female    DOB: [birth year], 76 y.o..   MRN: [id number] ? ?Chief Complaint  ?Patient presents with  ? Establish Care  ?  TOC- establish care. No concerns.      ? ? ?History of Present Illness: ? ?Patient is in today to establish care. [name] was born in [place], [place]. She moved to [place], [place] at about age 69. She worked for [employer] for over 30 years. She is now retired and moved to [place] 2 years ago to be closer to a daughter that lives here. [name] is single. She has 5 children, though one daughter died of breast cancer. She has 4 grandchildren and 13 great-grandchildren. She denies tobacco, alcohol, or drug use. ? ?[name] has a history of GERD. She is managed on omeprazole. She did try stopping this for a time, but did not tolerate that. She notes she has great difficulty swallowing pills, so mostly is managed with medicines that can either be crushed or are available in a liquid. ? ?[name] chart indicates both Type 2 diabetes and prediabetes. She is not on medication for managing such. ? ?[name] has a of a previous pulmonary embolus. She was recommended to be on indefinite anticoagulation with Eliquis. ? 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 She manages this with topical acyclovir when she has outbreaks. ? ?Past Medical History: ?Patient Active Problem List  ? Diagnosis Date Noted  ? Dysphagia 06/13/2020  ? Prediabetes 08/02/2019  ? Acute phlegmonous appendicitis s/p lap appendectomy 07/06/2019 07/05/2019  ? Chronic anticoagulation - Eliquis 07/05/2019  ? Noncompliance with medication treatment due to intermittent use of medication 07/05/2019  ? Cataract 05/03/2019  ? Osteopenia of left upper arm 05/03/2019  ? 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 Chronic pain  of both shoulders 05/03/2019  ? 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 Gastroesophageal reflux disease 10/21/2018  ? Hyperlipidemia 10/21/2018  ? History of pulmonary embolus (PE) - Dx'd 05/2018  06/21/2018  ? ?Past Surgical History:  ?Procedure Laterality Date  ? CESAREAN SECTION    ? EYE SURGERY    ? LAPAROSCOPIC APPENDECTOMY N/A 07/06/2019  ? Procedure: APPENDECTOMY LAPAROSCOPIC, TAP BLOCK;  Surgeon: Michael Boston, MD;  Location: [facility];  Service: General;  Laterality: N/A;  ? ?Family History  ?Problem Relation Age of Onset  ? Hypertension Mother   ? Hypertension Father   ? ?Outpatient Medications Prior to Visit  ?Medication Sig Dispense Refill  ? Ascorbic Acid (VITAMIN C) 500 MG CAPS Take 500 capsules by mouth daily.    ? aspirin 81 MG EC tablet Take 81 mg by mouth daily. Swallow whole.    ? carboxymethylcellulose (REFRESH PLUS) 0.5 % SOLN 1 drop 3 (three) times daily as needed.    ? cholecalciferol (VITAMIN D3) 25 MCG (1000 UNIT) tablet Take 1,000 Units by mouth daily.    ? fluticasone (FLONASE) 50 MCG/ACT nasal spray Place 1 spray into both nostrils daily. 16 g 6  ? LORATADINE PO Take by mouth.    ? meclizine (ANTIVERT) 25 MG tablet Take 1 tablet (25 mg total) by mouth 3 (three) times daily as needed for dizziness. 30 tablet 0  ? Multiple Vitamin (MULTIVITAMIN WITH MINERALS) TABS tablet Take 1 tablet by mouth daily.    ? omeprazole (  PRILOSEC) 20 MG capsule Take 1 capsule by mouth once daily 90 capsule 1  ? pravastatin (PRAVACHOL) 10 MG tablet Take 1 tablet by mouth once daily 90 tablet 2  ? vitamin B-12 (CYANOCOBALAMIN) 100 MCG tablet Take 100 mcg by mouth every other day.    ? acyclovir ointment (ZOVIRAX) 5 % APPLY  OINTMENT TOPICALLY TO AFFECTED AREA  EVERY 3 HOURS 15 g 0  ? acetaminophen (TYLENOL) 500 MG tablet Take 2 tablets (1,000 mg total) by mouth every 8 (eight) hours as needed for mild pain. 20 tablet 0  ? ?No facility-administered medications prior to visit.  ? ?Allergies  ?Allergen  Reactions  ? Sulfa Antibiotics Other (See Comments)  ?  bristers  ?   ?Objective:  ? ?Today's Vitals  ? 05/19/21 1258  ?BP: 116/72  ?Pulse: 64  ?Temp: (!) 96.9 ?F (36.1 ?C)  ?TempSrc: Temporal  ?SpO2: 99%  ?Weight: 143 lb (64.9 kg)  ?Height: 5\' 3"  (1.6 m)  ? ?Body mass index is 25.33 kg/m?.  ? ?General: Well developed, well nourished. No acute distress. ?Psych: Alert and oriented. Normal mood and affect. ? ?Health Maintenance Due  ?Topic Date Due  ? TETANUS/TDAP  Never done  ? Zoster Vaccines- Shingrix (1 of 2) Never done  ? Pneumonia Vaccine 79+ Years old (1 - PCV) Never done  ? HEMOGLOBIN A1C  04/22/2021  ?   ?Lab Results: ?Lab Results  ?Component Value Date  ? HGBA1C 6.0 10/23/2020  ? ?Lab Results  ?Component Value Date  ? CHOL 240 (H) 10/23/2020  ? HDL 51.90 10/23/2020  ? [place] 150 (H) 10/23/2020  ? LDLDIRECT 204.0 06/06/2020  ? TRIG 190.0 (H) 10/23/2020  ? CHOLHDL 5 10/23/2020  ? ?Assessment & Plan:  ? ?1. Prediabetes ?Review of prior labs indicate that [name] has prediabetes, not Type 2 diabetes. Her A1c has been under 6 without use of any diabetic medications.  ? ?2. HSV (herpes simplex virus) infection ?I will renew her acyclovir. ? ?- acyclovir ointment (ZOVIRAX) 5 %; Apply topically every 3 (three) hours.  Dispense: 15 g; Refill: 11 ? ?3. Mixed hyperlipidemia ?[name] is on a low dose of pravastatin. She may need a higher dose or a more potent statin to get her to goal. We discussed doing a fasting lipid profile at her next visit. ? ?4. History of pulmonary embolus (PE) - Dx'd 05/2018  ?5. Chronic anticoagulation - Eliquis ?Apparently, [name] has chosen not to stay on Eliquis. ? ?6. Gastroesophageal reflux disease, unspecified whether esophagitis present ?Continue Prilosec 20 mg daily. ? ?Return in about 5 months (around 10/19/2021) for Annual preventative care.  ? ?Betty Salter, MD ?

## 2021-05-21 ENCOUNTER — Telehealth: Payer: Self-pay

## 2021-05-21 NOTE — Telephone Encounter (Signed)
PA for Acyclovir 5% ointment submitted through cover my meds to [facility].  Awaiting response.   Dm/cma ? ? ?Key: [id number] ?

## 2021-06-03 ENCOUNTER — Other Ambulatory Visit: Payer: Self-pay | Admitting: Family Medicine

## 2021-06-03 DIAGNOSIS — K219 Gastro-esophageal reflux disease without esophagitis: Secondary | ICD-10-CM

## 2021-06-23 ENCOUNTER — Ambulatory Visit (INDEPENDENT_AMBULATORY_CARE_PROVIDER_SITE_OTHER): Payer: Medicare Other | Admitting: Family Medicine

## 2021-06-23 ENCOUNTER — Encounter: Payer: Self-pay | Admitting: Family Medicine

## 2021-06-23 VITALS — BP 124/82 | HR 89 | Temp 97.1°F | Ht 63.0 in | Wt 138.6 lb

## 2021-06-23 DIAGNOSIS — H60501 Unspecified acute noninfective otitis externa, right ear: Secondary | ICD-10-CM | POA: Diagnosis not present

## 2021-06-23 DIAGNOSIS — Z86711 Personal history of pulmonary embolism: Secondary | ICD-10-CM | POA: Diagnosis not present

## 2021-06-23 MED ORDER — NEOMYCIN-POLYMYXIN-HC 3.5-10000-1 OT SOLN: 3.0000 [drp] | Freq: Three times a day (TID) | OTIC | 0 refills | Status: DC

## 2021-06-23 NOTE — Progress Notes (Signed)
?[facility] ?[facility] ?[place] ?[place] [address] ?Dept: [phone] ?Dept Fax: [phone] ? ?Office Visit ? ?Subjective:  ? ? Patient ID: Betty [name], female    DOB: 1946/02/10, 76 y.o..   MRN: [id number] ? ?Chief Complaint  ?Patient presents with  ? Ear Fullness  ?  Right ear pain full feeling, sinus pressure on right side of face OTC meds not helping. Symptoms x 4-5 days.   ? ? ?History of Present Illness: ? ?Patient is in today for evalauiton of a 5-day history of right ear pain. She notes that this was very tender to the touch for several days. She did not have fever or sore throat. She has only mild nasal symptoms. She notes she followed some online advice and did do massage of the right neck along the borders of the SCM muscle. She is having less tenderness ot touch now. ? ?Ms. Wnek has a history of an unprovoked PE in 2020. She was prescribed indefinite Eliquis. However, she chose to stop taking this. She notes no one could explain to her about why she would need to continue on this medication. She felt the Eliquis was causing her to bruise too easily.She ahd switched to aspirin, but has also not been taking this more recently. ? ?Past Medical History: ?Patient Active Problem List  ? Diagnosis Date Noted  ? Dysphagia 06/13/2020  ? Prediabetes 08/02/2019  ? Acute phlegmonous appendicitis s/p lap appendectomy 07/06/2019 07/05/2019  ? Chronic anticoagulation - Eliquis 07/05/2019  ? Noncompliance with medication treatment due to intermittent use of medication 07/05/2019  ? Cataract 05/03/2019  ? Osteopenia of left upper arm 05/03/2019  ? 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 Chronic pain of both shoulders 05/03/2019  ? 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 Gastroesophageal reflux disease 10/21/2018  ? Hyperlipidemia 10/21/2018  ? History of pulmonary embolus (PE) - Dx'd 05/2018  06/21/2018  ? ?Past Surgical History:   ?Procedure Laterality Date  ? CESAREAN SECTION    ? EYE SURGERY    ? LAPAROSCOPIC APPENDECTOMY N/A 07/06/2019  ? Procedure: APPENDECTOMY LAPAROSCOPIC, TAP BLOCK;  Surgeon: Michael Boston, MD;  Location: [facility];  Service: General;  Laterality: N/A;  ? ?Family History  ?Problem Relation Age of Onset  ? Hypertension Mother   ? Hypertension Father   ? Alzheimer's disease Father   ? Cancer Daughter   ?     Breast  ? Cancer Paternal Aunt   ? Heart disease Paternal Grandmother   ? ?Outpatient Medications Prior to Visit  ?Medication Sig Dispense Refill  ? Ascorbic Acid (VITAMIN C) 500 MG CAPS Take 500 capsules by mouth daily.    ? carboxymethylcellulose (REFRESH PLUS) 0.5 % SOLN 1 drop 3 (three) times daily as needed.    ? cholecalciferol (VITAMIN D3) 25 MCG (1000 UNIT) tablet Take 1,000 Units by mouth daily.    ? fluticasone (FLONASE) 50 MCG/ACT nasal spray Place 1 spray into both nostrils daily. 16 g 6  ? LORATADINE PO Take by mouth.    ? Multiple Vitamin (MULTIVITAMIN WITH MINERALS) TABS tablet Take 1 tablet by mouth daily.    ? omeprazole (PRILOSEC) 20 MG capsule Take 1 capsule by mouth once daily 90 capsule 1  ? pravastatin (PRAVACHOL) 10 MG tablet Take 1 tablet by mouth once daily 90 tablet 2  ? vitamin B-12 (CYANOCOBALAMIN) 100 MCG tablet Take 100 mcg by mouth every other day.    ? acyclovir ointment (ZOVIRAX)  5 % Apply topically every 3 (three) hours. (Patient not taking: Reported on 06/23/2021) 15 g 11  ? aspirin 81 MG EC tablet Take 81 mg by mouth daily. Swallow whole. (Patient not taking: Reported on 06/23/2021)    ? meclizine (ANTIVERT) 25 MG tablet Take 1 tablet (25 mg total) by mouth 3 (three) times daily as needed for dizziness. (Patient not taking: Reported on 06/23/2021) 30 tablet 0  ? ?No facility-administered medications prior to visit.  ? ?Allergies  ?Allergen Reactions  ? Sulfa Antibiotics Other (See Comments)  ?  bristers  ?   ?Objective:  ? ?Today's Vitals  ? 06/23/21 1514  ?BP: 124/82  ?Pulse: 89  ?Temp:  (!) 97.1 ?F (36.2 ?C)  ?TempSrc: Temporal  ?SpO2: 98%  ?Weight: 138 lb 9.6 oz (62.9 kg)  ?Height: 5\' 3"  (1.6 m)  ? ?Body mass index is 24.55 kg/m?.  ? ?General: Well developed, well nourished. No acute distress. ?HEENT: Normocephalic, non-traumatic. External ears normal. +/- mild pain with tragal manipulation.  ? Right EAC with small amount of obscuring wax, easily removed with a curette.  Right TM is dull, but  ? without redness. Mucous membranes moist. Oropharynx clear. Good dentition. ?Neck: Supple. No lymphadenopathy. No thyromegaly. ?Psych: Alert and oriented. Normal mood and affect. ? ?Health Maintenance Due  ?Topic Date Due  ? HEMOGLOBIN A1C  04/22/2021  ?   ?Assessment & Plan:  ? ?1. Acute otitis externa of right ear, unspecified type ?Ms. Schorr appears to be having primarily outer ear symptoms. I will have her use some Cotrisporin drops until resolved. ? ?- neomycin-polymyxin-hydrocortisone (CORTISPORIN) OTIC solution; Place 3 drops into the right ear 3 (three) times daily.  Dispense: 10 mL; Refill: 0 ? ?2. History of pulmonary embolus (PE) - Dx'd 05/2018  ?I discussed with MS. Art the recommendation for indefinite anticoagulation for a person who has had an unprovoked PE to decrease the risk of recurrence. She expressed concern for the cost of Eliquis. She was agreeable to restarting to take a daily aspirin. She did express understanding that the best medical advice would have been to continue anticoagulation with a DOAC. ? ?Return if symptoms worsen or fail to improve.  ? ?Haydee Salter, MD ?

## 2021-06-23 NOTE — Addendum Note (Signed)
Addended by: Haydee Salter on: 06/23/2021 04:54 PM ? ? Modules accepted: Level of Service ? ?

## 2021-07-09 ENCOUNTER — Ambulatory Visit: Payer: Medicare Other

## 2021-07-09 ENCOUNTER — Telehealth: Payer: Self-pay

## 2021-07-09 NOTE — Telephone Encounter (Signed)
Called patient x3 for MWV , automatically goes to VM unable to leave a message . ? ?Patient may reschedule for the next available appointment . ? ?L.Abryana Lykens,LPN  ?

## 2021-07-22 ENCOUNTER — Other Ambulatory Visit: Payer: Self-pay

## 2021-07-22 DIAGNOSIS — E782 Mixed hyperlipidemia: Secondary | ICD-10-CM

## 2021-07-22 MED ORDER — PRAVASTATIN SODIUM 10 MG PO TABS: 10.0000 mg | ORAL_TABLET | Freq: Every day | ORAL | 1 refills | Status: DC

## 2021-07-22 NOTE — Telephone Encounter (Signed)
I spoke to patient and she scheduled AWV on 07/23/21. 

## 2021-07-23 ENCOUNTER — Telehealth: Payer: Self-pay

## 2021-07-23 ENCOUNTER — Ambulatory Visit: Payer: Medicare Other

## 2021-07-23 NOTE — Telephone Encounter (Signed)
Called patient x 3 with no answer , left vm to return call to reschedule appointment.  L.Mory Herrman,LPN

## 2021-07-25 ENCOUNTER — Ambulatory Visit: Payer: Medicare Other

## 2021-07-25 NOTE — Telephone Encounter (Signed)
I spoke with patient and she rescheduled AWV to 07/29/21.

## 2021-07-29 ENCOUNTER — Ambulatory Visit (INDEPENDENT_AMBULATORY_CARE_PROVIDER_SITE_OTHER): Payer: Medicare Other

## 2021-07-29 DIAGNOSIS — Z0000 Encounter for general adult medical examination without abnormal findings: Secondary | ICD-10-CM | POA: Diagnosis not present

## 2021-07-29 NOTE — Patient Instructions (Signed)
[name] , Thank you for taking time to come for your Medicare Wellness Visit. I appreciate your ongoing commitment to your health goals. Please review the following plan we discussed and let me know if I can assist you in the future.   Screening recommendations/referrals: Colonoscopy: no longer required  Mammogram: no longer required  Bone Density: 02/06/2020 Recommended yearly ophthalmology/optometry visit for glaucoma screening and checkup Recommended yearly dental visit for hygiene and checkup  Vaccinations: Influenza vaccine: declined  Pneumococcal vaccine: due  Tdap vaccine: due  Shingles vaccine: will consider     Advanced directives: none   Conditions/risks identified: none   Next appointment: none    Preventive Care 65 Years and Older, Female Preventive care refers to lifestyle choices and visits with your health care provider that can promote health and wellness. What does preventive care include? A yearly physical exam. This is also called an annual well check. Dental exams once or twice a year. Routine eye exams. Ask your health care provider how often you should have your eyes checked. Personal lifestyle choices, including: Daily care of your teeth and gums. Regular physical activity. Eating a healthy diet. Avoiding tobacco and drug use. Limiting alcohol use. Practicing safe sex. Taking low-dose aspirin every day. Taking vitamin and mineral supplements as recommended by your health care provider. What happens during an annual well check? The services and screenings done by your health care provider during your annual well check will depend on your age, overall health, lifestyle risk factors, and family history of disease. Counseling  Your health care provider may ask you questions about your: Alcohol use. Tobacco use. Drug use. Emotional well-being. Home and relationship well-being. Sexual activity. Eating habits. History of falls. Memory and ability to  understand (cognition). Work and work Astronomer. Reproductive health. Screening  You may have the following tests or measurements: Height, weight, and BMI. Blood pressure. Lipid and cholesterol levels. These may be checked every 5 years, or more frequently if you are over 13 years old. Skin check. Lung cancer screening. You may have this screening every year starting at age 46 if you have a 30-pack-year history of smoking and currently smoke or have quit within the past 15 years. Fecal occult blood test (FOBT) of the stool. You may have this test every year starting at age 96. Flexible sigmoidoscopy or colonoscopy. You may have a sigmoidoscopy every 5 years or a colonoscopy every 10 years starting at age 29. Hepatitis C blood test. Hepatitis B blood test. Sexually transmitted disease (STD) testing. Diabetes screening. This is done by checking your blood sugar (glucose) after you have not eaten for a while (fasting). You may have this done every 1-3 years. Bone density scan. This is done to screen for osteoporosis. You may have this done starting at age 53. Mammogram. This may be done every 1-2 years. Talk to your health care provider about how often you should have regular mammograms. Talk with your health care provider about your test results, treatment options, and if necessary, the need for more tests. Vaccines  Your health care provider may recommend certain vaccines, such as: Influenza vaccine. This is recommended every year. Tetanus, diphtheria, and acellular pertussis (Tdap, Td) vaccine. You may need a Td booster every 10 years. Zoster vaccine. You may need this after age 64. Pneumococcal 13-valent conjugate (PCV13) vaccine. One dose is recommended after age 39. Pneumococcal polysaccharide (PPSV23) vaccine. One dose is recommended after age 50. Talk to your health care provider about which screenings  and vaccines you need and how often you need them. This information is not  intended to replace advice given to you by your health care provider. Make sure you discuss any questions you have with your health care provider. Document Released: 03/08/2015 Document Revised: 10/30/2015 Document Reviewed: 12/11/2014 Elsevier Interactive Patient Education  2017 [employer] Prevention in the Home Falls can cause injuries. They can happen to people of all ages. There are many things you can do to make your home safe and to help prevent falls. What can I do on the outside of my home? Regularly fix the edges of walkways and driveways and fix any cracks. Remove anything that might make you trip as you walk through a door, such as a raised step or threshold. Trim any bushes or trees on the path to your home. Use bright outdoor lighting. Clear any walking paths of anything that might make someone trip, such as rocks or tools. Regularly check to see if handrails are loose or broken. Make sure that both sides of any steps have handrails. Any raised decks and porches should have guardrails on the edges. Have any leaves, snow, or ice cleared regularly. Use sand or salt on walking paths during winter. Clean up any spills in your garage right away. This includes oil or grease spills. What can I do in the bathroom? Use night lights. Install grab bars by the toilet and in the tub and shower. Do not use towel bars as grab bars. Use non-skid mats or decals in the tub or shower. If you need to sit down in the shower, use a plastic, non-slip stool. Keep the floor dry. Clean up any water that spills on the floor as soon as it happens. Remove soap buildup in the tub or shower regularly. Attach bath mats securely with double-sided non-slip rug tape. Do not have throw rugs and other things on the floor that can make you trip. What can I do in the bedroom? Use night lights. Make sure that you have a light by your bed that is easy to reach. Do not use any sheets or blankets that are  too big for your bed. They should not hang down onto the floor. Have a firm chair that has side arms. You can use this for support while you get dressed. Do not have throw rugs and other things on the floor that can make you trip. What can I do in the kitchen? Clean up any spills right away. Avoid walking on wet floors. Keep items that you use a lot in easy-to-reach places. If you need to reach something above you, use a strong step stool that has a grab bar. Keep electrical cords out of the way. Do not use floor polish or wax that makes floors slippery. If you must use wax, use non-skid floor wax. Do not have throw rugs and other things on the floor that can make you trip. What can I do with my stairs? Do not leave any items on the stairs. Make sure that there are handrails on both sides of the stairs and use them. Fix handrails that are broken or loose. Make sure that handrails are as [place] as the stairways. Check any carpeting to make sure that it is firmly attached to the stairs. Fix any carpet that is loose or worn. Avoid having throw rugs at the top or bottom of the stairs. If you do have throw rugs, attach them to the floor with carpet tape.  Make sure that you have a light switch at the top of the stairs and the bottom of the stairs. If you do not have them, ask someone to add them for you. What else can I do to help prevent falls? Wear shoes that: Do not have high heels. Have rubber bottoms. Are comfortable and fit you well. Are closed at the toe. Do not wear sandals. If you use a stepladder: Make sure that it is fully opened. Do not climb a closed stepladder. Make sure that both sides of the stepladder are locked into place. Ask someone to hold it for you, if possible. Clearly mark and make sure that you can see: Any grab bars or handrails. First and last steps. Where the edge of each step is. Use tools that help you move around (mobility aids) if they are needed. These  include: Canes. Walkers. Scooters. Crutches. Turn on the lights when you go into a dark area. Replace any light bulbs as soon as they burn out. Set up your furniture so you have a clear path. Avoid moving your furniture around. If any of your floors are uneven, fix them. If there are any pets around you, be aware of where they are. Review your medicines with your doctor. Some medicines can make you feel dizzy. This can increase your chance of falling. Ask your doctor what other things that you can do to help prevent falls. This information is not intended to replace advice given to you by your health care provider. Make sure you discuss any questions you have with your health care provider. Document Released: 12/06/2008 Document Revised: 07/18/2015 Document Reviewed: 03/16/2014 Elsevier Interactive Patient Education  2017 [employer].

## 2021-07-29 NOTE — Progress Notes (Signed)
Subjective:   Betty [name] is a 76 y.o. female who presents for an Initial Medicare Annual Wellness Visit.   I connected with today Betty [name] y by telephone and verified that I am speaking with the correct person using two identifiers. Location patient: home Location provider: work Persons participating in the virtual visit: patient, provider.   I discussed the limitations, risks, security and privacy concerns of performing an evaluation and management service by telephone and the availability of in person appointments. I also discussed with the patient that there may be a patient responsible charge related to this service. The patient expressed understanding and verbally consented to this telephonic visit.    Interactive audio and video telecommunications were attempted between this provider and patient, however failed, due to patient having technical difficulties OR patient did not have access to video capability.  We continued and completed visit with audio only.    Review of Systems     Cardiac Risk Factors include: advanced age (>4055men, 40>65 women)     Objective:    Today's Vitals   There is no height or weight on file to calculate BMI.     07/29/2021   12:49 PM 07/05/2019    4:28 PM 07/05/2019    1:51 PM  Advanced Directives  Does Patient Have a Medical Advance Directive? Yes No No  Type of Estate agentAdvance Directive Healthcare Power of [place];Living will    Does patient want to make changes to medical advance directive?  No - Patient declined Yes (ED - Information included in AVS)  Copy of Healthcare Power of Attorney in Chart? No - copy requested    Would patient like information on creating a medical advance directive?  No - Patient declined     Current Medications (verified) Outpatient Encounter Medications as of 07/29/2021  Medication Sig   acyclovir ointment (ZOVIRAX) 5 % Apply topically every 3 (three) hours.   Ascorbic Acid (VITAMIN C) 500 MG CAPS Take 500 capsules by  mouth daily.   aspirin 81 MG EC tablet Take 81 mg by mouth daily. Swallow whole.   carboxymethylcellulose (REFRESH PLUS) 0.5 % SOLN 1 drop 3 (three) times daily as needed.   cholecalciferol (VITAMIN D3) 25 MCG (1000 UNIT) tablet Take 1,000 Units by mouth daily.   fluticasone (FLONASE) 50 MCG/ACT nasal spray Place 1 spray into both nostrils daily.   LORATADINE PO Take by mouth.   Multiple Vitamin (MULTIVITAMIN WITH MINERALS) TABS tablet Take 1 tablet by mouth daily.   neomycin-polymyxin-hydrocortisone (CORTISPORIN) OTIC solution Place 3 drops into the right ear 3 (three) times daily.   omeprazole (PRILOSEC) 20 MG capsule Take 1 capsule by mouth once daily   pravastatin (PRAVACHOL) 10 MG tablet Take 1 tablet (10 mg total) by mouth daily.   vitamin B-12 (CYANOCOBALAMIN) 100 MCG tablet Take 100 mcg by mouth every other day.   No facility-administered encounter medications on file as of 07/29/2021.    Allergies (verified) Sulfa antibiotics   History: Past Medical History:  Diagnosis Date   Diabetes mellitus without complication (HCC)    borderline   High cholesterol    History of pulmonary embolus (PE) - Dx'd 05/2018  06/21/2018   Past Surgical History:  Procedure Laterality Date   CESAREAN SECTION     EYE SURGERY     LAPAROSCOPIC APPENDECTOMY N/A 07/06/2019   Procedure: APPENDECTOMY LAPAROSCOPIC, TAP BLOCK;  Surgeon: Karie SodaGross, Steven, MD;  Location: [facility];  Service: General;  Laterality: N/A;   Family History  Problem Relation Age of Onset   Hypertension Mother    Hypertension Father    Alzheimer's disease Father    Cancer Daughter        Breast   Cancer Paternal Aunt    Heart disease Paternal Grandmother    Social History   Socioeconomic History   Marital status: Divorced    Spouse name: Not on file   Number of children: 5   Years of education: Not on file   Highest education level: Not on file  Occupational History   Occupation: retired    Associate Professor: [employer]  Tobacco Use    Smoking status: Never   Smokeless tobacco: Never  Vaping Use   Vaping Use: Never used  Substance and Sexual Activity   Alcohol use: Never   Drug use: Never   Sexual activity: Not Currently  Other Topics Concern   Not on file  Social History Narrative   Lived in [place], [place]   Relocated to [place] in Aug 2020   Social Determinants of Health   Financial Resource Strain: Low Risk    Difficulty of Paying Living Expenses: Not hard at all  Food Insecurity: No Food Insecurity   Worried About Programme researcher, broadcasting/film/video in the Last Year: Never true   Barista in the Last Year: Never true  Transportation Needs: No Transportation Needs   Lack of Transportation (Medical): No   Lack of Transportation (Non-Medical): No  Physical Activity: Sufficiently Active   Days of Exercise per Week: 5 days   Minutes of Exercise per Session: 30 min  Stress: No Stress Concern Present   Feeling of Stress : Not at all  Social Connections: Socially Isolated   Frequency of Communication with Friends and Family: Three times a week   Frequency of Social Gatherings with Friends and Family: Three times a week   Attends Religious Services: Never   Active Member of Clubs or Organizations: No   Attends Banker Meetings: Never   Marital Status: Divorced    Tobacco Counseling Counseling given: Not Answered   Clinical Intake:  Pre-visit preparation completed: Yes  Pain : No/denies pain     Nutritional Risks: None Diabetes: No  How often do you need to have someone help you when you read instructions, pamphlets, or other written materials from your doctor or pharmacy?: 1 - Never What is the last grade level you completed in school?: High School  Diabetic?no   Interpreter Needed?: No  Information entered by :: L.Kitti Mcclish,LPN   Activities of Daily Living    07/29/2021   12:52 PM 10/23/2020   10:08 AM  In your present state of health, do you have any difficulty performing the following  activities:  Hearing? 0 0  Vision? 0 0  Difficulty concentrating or making decisions? 0 0  Walking or climbing stairs? 0 0  Dressing or bathing? 0 0  Doing errands, shopping? 0 0  Preparing Food and eating ? N   Using the Toilet? N   In the past six months, have you accidently leaked urine? N   Do you have problems with loss of bowel control? N   Managing your Medications? N   Managing your Finances? N   Housekeeping or managing your Housekeeping? N     Patient Care Team: Loyola Mast, MD as PCP - General (Family Medicine)  Indicate any recent Medical Services you may have received from other than [facility] providers in the past year (date may be  approximate).     Assessment:   This is a routine wellness examination for Johanna.  Hearing/Vision screen Vision Screening - Comments:: Annual eye exams wears glasses   Dietary issues and exercise activities discussed: Current Exercise Habits: Home exercise routine, Type of exercise: walking, Time (Minutes): 30, Frequency (Times/Week): 5, Weekly Exercise (Minutes/Week): 150, Intensity: Mild, Exercise limited by: orthopedic condition(s)   Goals Addressed   None    Depression Screen    07/29/2021   12:49 PM 07/29/2021   12:48 PM 06/23/2021    3:14 PM 04/04/2021   10:36 AM 10/23/2020   10:07 AM 10/20/2019    1:28 PM  PHQ 2/9 Scores  PHQ - 2 Score 0 0 0 0 0 0    Fall Risk    07/29/2021   12:49 PM 06/23/2021    3:14 PM 05/19/2021    1:03 PM 04/04/2021   10:36 AM 10/23/2020   10:07 AM  Fall Risk   Falls in the past year? 0 0 0 0 0  Number falls in past yr: 0 0 1  0  Injury with Fall? 0    0  Risk for fall due to :   No Fall Risks  No Fall Risks  Follow up Falls evaluation completed  Falls evaluation completed  Falls evaluation completed    FALL RISK PREVENTION PERTAINING TO THE HOME:  Any stairs in or around the home? No  If so, are there any without handrails? No  Home free of loose throw rugs in walkways, pet beds, electrical  cords, etc? Yes  Adequate lighting in your home to reduce risk of falls? Yes   ASSISTIVE DEVICES UTILIZED TO PREVENT FALLS:  Life alert? No  Use of a cane, walker or w/c? No  Grab bars in the bathroom? No  Shower chair or bench in shower? No  Elevated toilet seat or a handicapped toilet? No     Cognitive Function:  Normal cognitive status assessed by telephone conversation  by this Nurse Health Advisor. No abnormalities found.        Immunizations Immunization History  Administered Date(s) Administered   PFIZER Comirnaty(Gray Top)Covid-19 Tri-Sucrose Vaccine 04/15/2020   PFIZER(Purple Top)SARS-COV-2 Vaccination 09/23/2019, 10/24/2019    TDAP status: Due, Education has been provided regarding the importance of this vaccine. Advised may receive this vaccine at local pharmacy or Health Dept. Aware to provide a copy of the vaccination record if obtained from local pharmacy or Health Dept. Verbalized acceptance and understanding.  Flu Vaccine status: Declined, Education has been provided regarding the importance of this vaccine but patient still declined. Advised may receive this vaccine at local pharmacy or Health Dept. Aware to provide a copy of the vaccination record if obtained from local pharmacy or Health Dept. Verbalized acceptance and understanding.  Pneumococcal vaccine status: Due, Education has been provided regarding the importance of this vaccine. Advised may receive this vaccine at local pharmacy or Health Dept. Aware to provide a copy of the vaccination record if obtained from local pharmacy or Health Dept. Verbalized acceptance and understanding.  Covid-19 vaccine status: Completed vaccines  Qualifies for Shingles Vaccine? Yes   Zostavax completed No   Shingrix Completed?: No.    Education has been provided regarding the importance of this vaccine. Patient has been advised to call insurance company to determine out of pocket expense if they have not yet received this  vaccine. Advised may also receive vaccine at local pharmacy or Health Dept. Verbalized acceptance and understanding.  Screening Tests  Health Maintenance  Topic Date Due   HEMOGLOBIN A1C  04/22/2021   Zoster Vaccines- Shingrix (1 of 2) 09/23/2021 (Originally 12/14/1993)   TETANUS/TDAP  06/24/2022 (Originally 12/15/1962)   Pneumonia Vaccine 15+ Years old (1 - PCV) 06/30/2022 (Originally 12/14/2008)   INFLUENZA VACCINE  09/23/2021   DEXA SCAN  Completed   Hepatitis C Screening  Completed   HPV VACCINES  Aged Out   FOOT EXAM  Discontinued   OPHTHALMOLOGY EXAM  Discontinued   URINE MICROALBUMIN  Discontinued   COVID-19 Vaccine  Discontinued    Health Maintenance  Health Maintenance Due  Topic Date Due   HEMOGLOBIN A1C  04/22/2021    Colorectal cancer screening: No longer required.   Mammogram status: No longer required due to age.  Bone Density status: Completed 02/06/2020. Results reflect: Bone density results: OSTEOPENIA. Repeat every 5 years.  Lung Cancer Screening: (Low Dose CT Chest recommended if Age 69-80 years, 30 pack-year currently smoking OR have quit w/in 15years.) does not qualify.   Lung Cancer Screening Referral: n/a  Additional Screening:  Hepatitis C Screening: does not qualify;   Vision Screening: Recommended annual ophthalmology exams for early detection of glaucoma and other disorders of the eye. Is the patient up to date with their annual eye exam?  Yes  Who is the provider or what is the name of the office in which the patient attends annual eye exams? [facility]  If pt is not established with a provider, would they like to be referred to a provider to establish care? No .   Dental Screening: Recommended annual dental exams for proper oral hygiene  Community Resource Referral / Chronic Care Management: CRR required this visit?  No   CCM required this visit?  No      Plan:     I have personally reviewed and noted the following in the  patient's chart:   Medical and social history Use of alcohol, tobacco or illicit drugs  Current medications and supplements including opioid prescriptions. Patient is not currently taking opioid prescriptions. Functional ability and status Nutritional status Physical activity Advanced directives List of other physicians Hospitalizations, surgeries, and ER visits in previous 12 months Vitals Screenings to include cognitive, depression, and falls Referrals and appointments  In addition, I have reviewed and discussed with patient certain preventive protocols, quality metrics, and best practice recommendations. A written personalized care plan for preventive services as well as general preventive health recommendations were provided to patient.     March Rummage, LPN   05/02/7671   Nurse Notes: none

## 2021-08-01 ENCOUNTER — Other Ambulatory Visit: Payer: Self-pay | Admitting: Family Medicine

## 2021-08-01 DIAGNOSIS — K219 Gastro-esophageal reflux disease without esophagitis: Secondary | ICD-10-CM

## 2021-08-02 ENCOUNTER — Other Ambulatory Visit: Payer: Self-pay | Admitting: Family Medicine

## 2021-08-02 DIAGNOSIS — K219 Gastro-esophageal reflux disease without esophagitis: Secondary | ICD-10-CM

## 2021-08-04 ENCOUNTER — Telehealth: Payer: Self-pay | Admitting: Family Medicine

## 2021-08-04 DIAGNOSIS — K219 Gastro-esophageal reflux disease without esophagitis: Secondary | ICD-10-CM

## 2021-08-04 NOTE — Telephone Encounter (Signed)
Caller Name: pt  Call back phone #: [phone]  MEDICATION(S): razole (PRILOSEC) 20 MG capsule [[id number]]   Has the patient contacted their pharmacy (YES/NO)?  Yes they are sending the request to her previous providers. They asked her to call her PCP.   Preferred Pharmacy: [facility] [address], [place] - [address]  [address] [place] [address]  Phone:  [phone]  Fax:  [phone]   ~~~Please advise patient/caregiver to allow 2-3 business days to process RX refills.

## 2021-08-05 MED ORDER — OMEPRAZOLE 20 MG PO CPDR: 20.0000 mg | DELAYED_RELEASE_CAPSULE | Freq: Every day | ORAL | 3 refills | Status: DC

## 2021-08-05 NOTE — Telephone Encounter (Signed)
Refill request for  Prilosec 20 mg LR  09/25/20, #90, 1 rf LOV 06/23/21 FOV none scheduled.  Please review and advise.  Thanks.  Dm/cma

## 2021-08-06 NOTE — Telephone Encounter (Signed)
Patient notified VIA phone.  No questions.  Dm/cma ? ?

## 2021-08-12 ENCOUNTER — Ambulatory Visit (INDEPENDENT_AMBULATORY_CARE_PROVIDER_SITE_OTHER): Payer: Medicare Other | Admitting: Family Medicine

## 2021-08-12 ENCOUNTER — Encounter (HOSPITAL_COMMUNITY): Payer: Self-pay | Admitting: Emergency Medicine

## 2021-08-12 ENCOUNTER — Emergency Department (HOSPITAL_COMMUNITY): Payer: Medicare Other

## 2021-08-12 ENCOUNTER — Emergency Department (HOSPITAL_COMMUNITY)
Admission: EM | Admit: 2021-08-12 | Discharge: 2021-08-12 | Discharge status: Against Medical Advice | Payer: Medicare Other | Attending: Emergency Medicine | Admitting: Emergency Medicine

## 2021-08-12 VITALS — BP 120/78 | HR 79 | Temp 97.0°F | Wt 138.8 lb

## 2021-08-12 DIAGNOSIS — M25569 Pain in unspecified knee: Secondary | ICD-10-CM | POA: Insufficient documentation

## 2021-08-12 DIAGNOSIS — Y93E1 Activity, personal bathing and showering: Secondary | ICD-10-CM | POA: Diagnosis not present

## 2021-08-12 DIAGNOSIS — X501XXA Overexertion from prolonged static or awkward postures, initial encounter: Secondary | ICD-10-CM | POA: Insufficient documentation

## 2021-08-12 DIAGNOSIS — H9201 Otalgia, right ear: Secondary | ICD-10-CM

## 2021-08-12 DIAGNOSIS — Z5321 Procedure and treatment not carried out due to patient leaving prior to being seen by health care provider: Secondary | ICD-10-CM | POA: Insufficient documentation

## 2021-08-12 DIAGNOSIS — S83005A Unspecified dislocation of left patella, initial encounter: Secondary | ICD-10-CM

## 2021-08-12 DIAGNOSIS — M1712 Unilateral primary osteoarthritis, left knee: Secondary | ICD-10-CM | POA: Diagnosis not present

## 2021-08-12 MED ORDER — NAPROXEN 500 MG PO TABS: 500.0000 mg | ORAL_TABLET | Freq: Two times a day (BID) | ORAL | 0 refills | Status: DC

## 2021-08-12 NOTE — ED Triage Notes (Signed)
Pt reports that she was getting in the bath and her patella popped out of place. She states that she pushed it back in manually. No pain now. Ambulatory.

## 2021-08-12 NOTE — ED Notes (Signed)
Pt states that she will go to her PCP in the AM.

## 2021-08-12 NOTE — Progress Notes (Signed)
[facility] [address] [place] [address] Dept: [phone] Dept Fax: [phone]  Office Visit  Subjective:    Patient ID: Betty [name], female    DOB: 06-05-1945, 76 y.o..   MRN: [id number]  Chief Complaint  Patient presents with   Acute Visit    C/o having had her Rt knee popped out of socket when in her tub last night.  Has been using ice only.     History of Present Illness:  Patient is in today for assessment of her left knee. Betty [name] notes she was getting into her tub last night. As she was in a semi-kneeling position, she tried to bring her left knee around. She had a sudden pain laterally int he knee and describes it "popping" out of joint. She was bale to force the knee back in position. She did go to the ED. An xray was performed, but she was not seen by a provider after 5 hours, so left to come home.She has had continued pain in the knee. She had not had an issue like this in the past.  I had seen Betty [name] with an external otitis about 6 weeks ago. She was treated with Cortisporin drops. She notes that this improved, but the ear is back to feeling a little irritated.  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   Gastroesophageal reflux disease 10/21/2018   Hyperlipidemia 10/21/2018   History of pulmonary embolus (PE) - Dx'd 05/2018  06/21/2018   Past Surgical History:  Procedure Laterality Date   CESAREAN SECTION     EYE SURGERY     LAPAROSCOPIC  APPENDECTOMY N/A 07/06/2019   Procedure: APPENDECTOMY LAPAROSCOPIC, TAP BLOCK;  Surgeon: Karie Soda, MD;  Location: [facility];  Service: General;  Laterality: N/A;   Family History  Problem Relation Age of Onset   Hypertension Mother    Hypertension Father    Alzheimer's disease Father    Cancer Daughter        Breast   Cancer Paternal Aunt    Heart disease Paternal Grandmother    Outpatient Medications Prior to Visit  Medication Sig Dispense Refill   acyclovir ointment (ZOVIRAX) 5 % Apply topically every 3 (three) hours. 15 g 11   Ascorbic Acid (VITAMIN C) 500 MG CAPS Take 500 capsules by mouth daily.     aspirin 81 MG EC tablet Take 81 mg by mouth daily. Swallow whole.     b complex vitamins capsule Take 1 capsule by mouth daily.     carboxymethylcellulose (REFRESH PLUS) 0.5 % SOLN 1 drop 3 (three) times daily as needed.     cholecalciferol (VITAMIN D3) 25 MCG (1000 UNIT) tablet Take 1,000 Units by mouth daily.     fluticasone (FLONASE) 50 MCG/ACT nasal spray Place 1 spray into both nostrils daily. 16 g 6   LORATADINE PO Take by mouth.     Multiple Vitamin (MULTIVITAMIN WITH MINERALS) TABS tablet Take 1 tablet by mouth daily.     omeprazole (PRILOSEC) 20 MG capsule Take 1 capsule (20  mg total) by mouth daily. 90 capsule 3   pravastatin (PRAVACHOL) 10 MG tablet Take 1 tablet (10 mg total) by mouth daily. 90 tablet 1   vitamin B-12 (CYANOCOBALAMIN) 100 MCG tablet Take 100 mcg by mouth every other day.     neomycin-polymyxin-hydrocortisone (CORTISPORIN) OTIC solution Place 3 drops into the right ear 3 (three) times daily. (Patient not taking: Reported on 08/12/2021) 10 mL 0   No facility-administered medications prior to visit.   Allergies  Allergen Reactions   Sulfa Antibiotics Other (See Comments)    bristers   Objective:   Today's Vitals   08/12/21 1455  BP: 120/78  Pulse: 79  Temp: (!) 97 F (36.1 C)  TempSrc: Temporal  SpO2: 97%  Weight: 138 lb 12.8 oz (63 kg)   Body  mass index is 24.59 kg/m.   General: Well developed, well nourished. No acute distress. HEENT: Right external ear normal. EAC and TMs normal.  Extremities: Full ROM. No swelling or increased warmth noted. Pain localized along the lateral   aspect of the patella and the lateral joint line. Varus/valgus testing normal. Lochamn's and   McMurrays normal.  Psych: Alert and oriented. Normal mood and affect.  Health Maintenance Due  Topic Date Due   HEMOGLOBIN A1C  04/22/2021     Imaging: Left knee x-ray-  IMPRESSION: 1. No acute fracture or dislocation. 2. Mild-to-moderate degenerative changes, most pronounced in the patellofemoral compartment.  Assessment & Plan:   1. Dislocation of left patella, initial encounter Base don history and exam, Betty [name] likely dislocated her knee cap. I recommend relative rest. She should ice this for the next 24 hours and then switch to warm soaks with ROM. I will have her take Naproxen and give this time to improve. - naproxen (NAPROSYN) 500 MG tablet; Take 1 tablet (500 mg total) by mouth 2 (two) times daily with a meal.  Dispense: 30 tablet; Refill: 0  2. Otalgia, right ear She can continue to use some Cortisporin if needed for comfort. I advised against daily, Nienhaus-term use.   Return if symptoms worsen or fail to improve.   Loyola Mast, MD

## 2021-08-12 NOTE — Patient Instructions (Signed)
Patellar Dislocation and Subluxation  The kneecap (patella) is located in a groove in front of the lower end of your thighbone (femur). This groove is called the patellofemoral groove.  A patellar dislocation occurs when your patella slips all the way out of the groove.  A patellar subluxation occurs if the patella slips partly out of the groove. What are the causes? This condition may be caused by: A force on the knee. Sports injuries. Twisting the knee when the foot is planted. What increases the risk? You are more likely to develop this condition if you: Are an athlete in your teens or 20s. Have had this condition before. Play certain kinds of sports, including sports: That have quick turns, quick changes in direction, or contact with other players, like soccer. That require jumping, such as basketball or volleyball. In which cleats are worn. What are the signs or symptoms? Symptoms of this condition include: A feeling that the knee is buckling, followed by sudden extreme pain in the knee. A deformed knee. A popping sensation, followed by a feeling that something is out of place. Inability to bend or straighten the knee. Swelling in the knee. How is this diagnosed? This condition may be diagnosed with: A physical exam. An X-ray. This may be done to see the position of the patella or to see if a bone is broken. An MRI. This may be done to look at the alignment of your knee and the ligaments that hold your patella in place. How is this treated? Your patella may move back into place on its own when you straighten your knee, or your health care provider will move it back into place. After your patella is back in its normal position, other treatments may be done, including: Wearing a knee brace to keep your knee from moving while it heals (immobilization). Doing exercises that help improve strength and movement in your knee. Your health care provider may recommend that you see a  physical therapist. Taking medicine or applying ice to help with pain and inflammation. Having surgery to prevent the patella from slipping out of place or to clean out any loose cartilage in your joint. This may be needed if other treatments do not help or if the condition keeps happening. Follow these instructions at home: If you have a removable brace:  Wear the brace as told by your health care provider. Remove it only as told by your health care provider. Check the skin around the brace every day. Tell your health care provider about any concerns. Loosen the brace if your toes tingle, become numb, or turn cold and blue. Keep the brace dry and clean. If the brace is not waterproof: Do not let it get wet. Cover it with a watertight covering when you take a bath or shower. Managing pain, stiffness, and swelling  If directed, put ice on the injured area. To do this: If you have a removable brace, remove it as told by your health care provider. Put ice in a plastic bag. Place a towel between your skin and the bag. Leave the ice on for 20 minutes, 2-3 times a day. Remove the ice if your skin turns bright red. This is very important. If you cannot feel pain, heat, or cold, you have a greater risk of damage to the area. Move your toes often to reduce stiffness and swelling. Raise (elevate) the injured area above the level of your heart while you are sitting or lying down. Activity Do not   use the injured limb to support your body weight until your health care provider says that you can. Use crutches as instructed Do exercises as told by your health care provider. Return to your normal activities as told by your health care provider. Ask your health care provider what activities are safe for you. General instructions Take over-the-counter and prescription medicines only as told by your health care provider. Do not use any products that contain nicotine or tobacco. These products include  cigarettes, chewing tobacco, and vaping devices, such as e-cigarettes. These can delay bone healing. If you need help quitting, ask your health care provider. Keep all follow-up visits. This is important. How is this prevented? Stretch before and after any physical activity. Give your body time to rest between periods of activity. Make sure to use equipment that fits you. Be safe and responsible while being active. This will help you avoid falls. Contact a health care provider if: Your pain or swelling does not get better. Your knee catches or locks. You have more warmth or redness (inflammation) in the knee. Get help right away if: You are unable to bend your knee. The pain in your knee gets worse and is not relieved by medicine. Your patella slips out of its normal position again. You have new swelling, pain, or tenderness in any area of your affected leg. Summary Patellar dislocation and patellar subluxation are injuries that happen when the patella slips out of its normal position. If your patella does not move back into place on its own, your health care provider will move it back into place. Do not use the injured limb to support your body weight until your health care provider says that you can. Use crutches as told by your health care provider. Treatment may involve icing, medicine, a knee brace, and exercises as told by your health care provider. This information is not intended to replace advice given to you by your health care provider. Make sure you discuss any questions you have with your health care provider. Document Revised: 10/24/2020 Document Reviewed: 10/24/2020 Elsevier Patient Education  2023 Elsevier Inc.  

## 2021-08-30 ENCOUNTER — Other Ambulatory Visit: Payer: Self-pay | Admitting: Family Medicine

## 2021-08-30 DIAGNOSIS — S83005A Unspecified dislocation of left patella, initial encounter: Secondary | ICD-10-CM

## 2021-09-12 ENCOUNTER — Ambulatory Visit (INDEPENDENT_AMBULATORY_CARE_PROVIDER_SITE_OTHER): Payer: Medicare Other

## 2021-09-12 ENCOUNTER — Ambulatory Visit: Payer: Medicare Other | Admitting: Orthopaedic Surgery

## 2021-09-12 DIAGNOSIS — M25562 Pain in left knee: Secondary | ICD-10-CM | POA: Diagnosis not present

## 2021-09-12 DIAGNOSIS — M79671 Pain in right foot: Secondary | ICD-10-CM

## 2021-09-12 NOTE — Progress Notes (Signed)
Office Visit Note   Patient: Betty [name]           Date of Birth: 1945/05/09           MRN: [id number] Visit Date: 09/12/2021              Requested by: Loyola Mast, MD [address] [place],  [place] [address] PCP: Loyola Mast, MD   Assessment & Plan: Visit Diagnoses:  1. Acute pain of left knee   2. Pain in right foot     Plan: Impression is status post left patella dislocation and right planter fasciitis.  Both conditions are improving.  We will place her in a PSO brace and make a referral to outpatient PT.  For the right heel she will continue with exercises and pick up some arch supports.  Follow-up as needed.  Follow-Up Instructions: No follow-ups on file.   Orders:  Orders Placed This Encounter  Procedures   XR KNEE 3 VIEW LEFT   XR Os Calcis Right   Ambulatory referral to Physical Therapy   No orders of the defined types were placed in this encounter.     Procedures: No procedures performed   Clinical Data: No additional findings.   Subjective: Chief Complaint  Patient presents with   Left Knee - Pain   Right Heel - Pain    HPI Betty [name] is a pleasant 76 year old female here for left knee injury and right heel pain.  She dislocated her left knee 3 weeks ago.  She reduced herself.  Subsequently went to the ED for x-rays which were normal.  Currently feels soreness.  She has had right heel pain for quite some time.  Has been doing exercises on her own and has felt improvement. Review of Systems  Constitutional: Negative.   HENT: Negative.    Eyes: Negative.   Respiratory: Negative.    Cardiovascular: Negative.   Endocrine: Negative.   Musculoskeletal: Negative.   Neurological: Negative.   Hematological: Negative.   Psychiatric/Behavioral: Negative.    All other systems reviewed and are negative.    Objective: Vital Signs: There were no vitals taken for this visit.  Physical Exam Vitals and nursing note reviewed.   Constitutional:      Appearance: She is well-developed.  HENT:     Head: Atraumatic.     Nose: Nose normal.  Eyes:     Extraocular Movements: Extraocular movements intact.  Cardiovascular:     Pulses: Normal pulses.  Pulmonary:     Effort: Pulmonary effort is normal.  Abdominal:     Palpations: Abdomen is soft.  Musculoskeletal:     Cervical back: Neck supple.  Skin:    General: Skin is warm.     Capillary Refill: Capillary refill takes less than 2 seconds.  Neurological:     Mental Status: She is alert. Mental status is at baseline.  Psychiatric:        Behavior: Behavior normal.        Thought Content: Thought content normal.        Judgment: Judgment normal.     Ortho Exam Examination left knee shows normal patellar tracking.  Collaterals and cruciates are stable.  No joint line tenderness or joint effusion. Examination of right heel shows tenderness to the plantar aspect of the heel.  Achilles insertion nontender.  Ankle dorsiflexion to 5 degrees. Specialty Comments:  No specialty comments available.  Imaging: No results found.   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   Gastroesophageal reflux disease 10/21/2018   Hyperlipidemia 10/21/2018   History of pulmonary embolus (PE) - Dx'd 05/2018  06/21/2018   Past Medical History:  Diagnosis Date   Diabetes mellitus without complication (HCC)    borderline   High cholesterol    History of pulmonary embolus (PE) - Dx'd 05/2018  06/21/2018    Family History  Problem  Relation Age of Onset   Hypertension Mother    Hypertension Father    Alzheimer's disease Father    Cancer Daughter        Breast   Cancer Paternal Aunt    Heart disease Paternal Grandmother     Past Surgical History:  Procedure Laterality Date   CESAREAN SECTION     EYE SURGERY     LAPAROSCOPIC APPENDECTOMY N/A 07/06/2019   Procedure: APPENDECTOMY LAPAROSCOPIC, TAP BLOCK;  Surgeon: Karie Soda, MD;  Location: [facility];  Service: General;  Laterality: N/A;   Social History   Occupational History   Occupation: retired    Associate Professor: [employer]  Tobacco Use   Smoking status: Never   Smokeless tobacco: Never  Vaping Use   Vaping Use: Never used  Substance and Sexual Activity   Alcohol use: Never   Drug use: Never   Sexual activity: Not Currently

## 2021-09-23 ENCOUNTER — Encounter: Payer: Self-pay | Admitting: Physical Therapy

## 2021-09-23 ENCOUNTER — Ambulatory Visit: Payer: Medicare Other | Admitting: Physical Therapy

## 2021-09-23 DIAGNOSIS — M25662 Stiffness of left knee, not elsewhere classified: Secondary | ICD-10-CM

## 2021-09-23 DIAGNOSIS — R262 Difficulty in walking, not elsewhere classified: Secondary | ICD-10-CM | POA: Diagnosis not present

## 2021-09-23 DIAGNOSIS — M25562 Pain in left knee: Secondary | ICD-10-CM | POA: Diagnosis not present

## 2021-09-23 DIAGNOSIS — M6281 Muscle weakness (generalized): Secondary | ICD-10-CM

## 2021-09-23 DIAGNOSIS — M79671 Pain in right foot: Secondary | ICD-10-CM

## 2021-09-23 NOTE — Therapy (Signed)
OUTPATIENT PHYSICAL THERAPY LOWER EXTREMITY EVALUATION   Patient Name: Betty [name] MRN: [id number] DOB:02/10/1946, 76 y.o., female Today's Date: 09/23/2021   PT End of Session - 09/23/21 0936     Visit Number 1    Number of Visits 12    Date for PT Re-Evaluation 11/07/21    Progress Note Due on Visit 10    PT Start Time 0934    PT Stop Time 1012    PT Time Calculation (min) 38 min    Activity Tolerance Patient tolerated treatment well    Behavior During Therapy [facility] for tasks assessed/performed             Past Medical History:  Diagnosis Date   Diabetes mellitus without complication (HCC)    borderline   High cholesterol    History of pulmonary embolus (PE) - Dx'd 05/2018  06/21/2018   Past Surgical History:  Procedure Laterality Date   CESAREAN SECTION     EYE SURGERY     LAPAROSCOPIC APPENDECTOMY N/A 07/06/2019   Procedure: APPENDECTOMY LAPAROSCOPIC, TAP BLOCK;  Surgeon: Karie Soda, MD;  Location: [facility];  Service: General;  Laterality: N/A;   Patient Active Problem List   Diagnosis Date Noted   Dysphagia 06/13/2020   Prediabetes 08/02/2019   Acute phlegmonous appendicitis s/p lap appendectomy 07/06/2019 07/05/2019   Noncompliance with medication treatment due to intermittent use of medication 07/05/2019   Cataract 05/03/2019   Osteopenia of left upper arm 05/03/2019   Arthritis of left acromioclavicular joint 05/03/2019   Chronic pain of both shoulders 05/03/2019   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   Gastroesophageal reflux disease 10/21/2018   Hyperlipidemia 10/21/2018   History of pulmonary embolus (PE) - Dx'd 05/2018  06/21/2018    PCP: Loyola Mast MD  REFERRING PROVIDER: Gershon Mussel, MD  REFERRING DIAG: [phone] (ICD-10-CM) - Acute pain of left knee M79.671 (ICD-10-CM) - Pain in right foot  THERAPY DIAG:  Acute pain of left knee  Pain in right foot  Stiffness of left knee, not elsewhere classified  Muscle weakness  (generalized)  Difficulty in walking, not elsewhere classified  Rationale for Evaluation and Treatment Rehabilitation  ONSET DATE: about 1 month ago  SUBJECTIVE:   SUBJECTIVE STATEMENT: Pt stating about 1 month ago she dislocated her left patella and she had to put it back herself. Pt saw Dr. Roda Shutters on 09/12/21 and her X-rays were normal with mild to moderate tricompartmental osteoarthritis.  Pt also reporting Left heel pain which has been ongoing. 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   PERTINENT HISTORY: Left patellar dislocation Rt plantar fascitis High cholesterol, DM, history of PE 2020  PAIN: Are you having pain? Yes: NPRS scale: 2-3/10 Pain location: Left knee, Rt foot first thing in morning is worse pain Pain description: achy, tightness Aggravating factors: foot hurts worse in morning, knee hurts worse with bending Relieving factors: voltaran gel, prescription meds  PRECAUTIONS: None  WEIGHT BEARING RESTRICTIONS No  FALLS:  Has patient fallen in last 6 months? No  LIVING ENVIRONMENT: Lives with: lives with their family Lives in: House/apartment None  OCCUPATION: works in Airline pilot  PLOF: Independent  PATIENT GOALS: Be able to walk without pain, decrease my pain in my Rt foot when first getting up and after sitting.    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 Osteophyte formation of calcaneus consistent with plantar fasciitis and  achilles tendinopathy  PATIENT SURVEYS:  09/23/21: FOTO 47% predicted 60%  COGNITION:  Overall cognitive status: Within functional limits for tasks assessed     SENSATION: 09/23/21: WFL  EDEMA:  09/23/21: Circumferential: Left knee 35 centimeters, Rt knee 36 centimeters, pt stating her Rt  knee has always been bigger than her left    POSTURE: rounded shoulders and forward head  PALPATION: 09/23/21: TTP: plantar fascia Rt foot, calcaneal  tubercle, medial knee pain  LOWER EXTREMITY ROM:  A=active P=passive  ROM Right 09/23/21 Left 09/23/21  Hip flexion    Hip extension    Hip abduction    Hip adduction    Hip internal rotation    Hip external rotation    Knee flexion A: 136 P: 138 A: 130 P: 135  Knee extension    Ankle dorsiflexion A: 5  P: 8  A: 12 P: 15  Ankle plantarflexion A: 20 P: 24 A: 22 P: 24  Ankle inversion A: 30 P: 40 A: 30 P: 42  Ankle eversion A: 45 P: 50 A: 45 P: 50   (Blank rows = not tested)  LOWER EXTREMITY MMT:  MMT Right eval Left eval  Hip flexion 4/5 4/5  Hip extension    Hip abduction 4/5 4/5  Hip adduction 4/5 4/5  Hip internal rotation    Hip external rotation    Knee flexion 5/5 4/5  Knee extension 5/5 4/5  Ankle dorsiflexion 5/5 5/5  Ankle plantarflexion 5/5 5/5  Ankle inversion 4+/5 5/5  Ankle eversion 4+/5 5/5   (Blank rows = not tested)    FUNCTIONAL TESTS:  09/23/21: 5 times sit to stand: 14 seconds no UE support  GAIT:  09/23/21:  Distance walked: 50 feet Assistive device utilized: None Level of assistance: Complete Independence Comments: mild antalgic gait off Rt foot during stance    TODAY'S TREATMENT: 09/23/21:  HEP instruction/performance c cues for techniques, handout provided.  Trial set performed of each for comprehension and symptom assessment.  See below for exercise list.   PATIENT EDUCATION:  Education details: PT POC, HEP Person educated: Patient Education method: Explanation, Demonstration, Tactile cues, Verbal cues, and Handouts Education comprehension: verbalized understanding and returned demonstration   HOME EXERCISE PROGRAM: Access Code: [email or web address] URL: [email or web address] Date: 09/23/2021 Prepared by: Narda Amber  Exercises - Seated Ankle Alphabet  - 2-3 x daily - 7 x weekly - 1 reps - Gastroc Stretch on Wall  - 2-3 x daily - 7 x weekly - 3 reps - 20 seconds hold - Seated Hamstring Stretch  - 2-3 x daily -  7 x weekly - 3 reps - 20 seocnds hold - Supine Knee Extension Strengthening  - 2-3 x daily - 7 x weekly - 2 sets - 10 reps - 5 seocnds hold - Supine Active Straight Leg Raise  - 2-3 x daily - 7 x weekly - 2 sets - 10 reps - Supine Bridge  - 2-3 x daily - 7 x weekly - 2 sets - 10 reps - 5 seconds hold  ASSESSMENT:  CLINICAL IMPRESSION: Patient is a 76 y.o. who comes to clinic with complaints of left knee pain due to patella dislocation and Rt foot pain due to plantar fascitis with mobility, strength and movement coordination deficits that impair their ability to perform usual daily and recreational functional activities without increase difficulty/symptoms.  Patient to benefit from skilled PT services to address impairments and limitations to improve to previous level of function without restriction secondary to condition.   OBJECTIVE  IMPAIRMENTS decreased activity tolerance, decreased mobility, difficulty walking, decreased ROM, decreased strength, increased edema, impaired flexibility, and pain.   ACTIVITY LIMITATIONS lifting, bending, standing, stairs, and dressing  PARTICIPATION LIMITATIONS: driving and community activity, occupation where she sits as needed  PERSONAL FACTORS Left patellar dislocation Rt plantar fascitis High cholesterol, DM, history of PE 2020 are also affecting patient's functional outcome.   REHAB POTENTIAL: Good  CLINICAL DECISION MAKING: Stable/uncomplicated  EVALUATION COMPLEXITY: Low   GOALS: Goals reviewed with patient? Yes  SHORT TERM GOALS: Target date: 10/14/2021  Patient will demonstrate independent use of initial home exercise program to maintain progress from in clinic treatments. Goal status: New  [place] TERM GOALS: Target date: 11/04/2021  Patient will demonstrate/report pain at worst less than or equal to 2/10 to facilitate minimal limitation in daily activity secondary to pain symptoms. Goal status: New   Patient will demonstrate independent use  of home exercise program to facilitate ability to maintain/progress functional gains from skilled physical therapy services. Goal status: New   Patient will demonstrate FOTO outcome > or = 60 % to indicate reduced disability due to condition. Goal status: New   Pt will improve her Rt DF to >/= 10 degrees active ROM to improve gait. Goal status: New       5.  Pt will improve her left knee strength to grossly 5/5 in order to improve functional mobility and gait.   PLAN: PT FREQUENCY: 1-2x/week  PT DURATION: 6 weeks  PLANNED INTERVENTIONS: Therapeutic exercises, Therapeutic activity, Neuromuscular re-education, Balance training, Gait training, Patient/Family education, Self Care, Joint mobilization, Dry Needling, Electrical stimulation, Cryotherapy, Moist heat, Taping, Ultrasound, Ionotophoresis 4mg /ml Dexamethasone, and Manual therapy  PLAN FOR NEXT SESSION: review HEP as needed, knee strengthening, tape left patella, add ankle band exercises as tolerated    , PT, MPT 09/23/2021, 9:40 AM

## 2021-09-26 ENCOUNTER — Encounter: Payer: Self-pay | Admitting: Family Medicine

## 2021-09-26 ENCOUNTER — Ambulatory Visit (INDEPENDENT_AMBULATORY_CARE_PROVIDER_SITE_OTHER): Payer: Medicare Other | Admitting: Family Medicine

## 2021-09-26 VITALS — BP 114/74 | HR 69 | Temp 97.4°F | Ht 63.0 in | Wt 139.8 lb

## 2021-09-26 DIAGNOSIS — K219 Gastro-esophageal reflux disease without esophagitis: Secondary | ICD-10-CM

## 2021-09-26 DIAGNOSIS — R1310 Dysphagia, unspecified: Secondary | ICD-10-CM

## 2021-09-26 DIAGNOSIS — J029 Acute pharyngitis, unspecified: Secondary | ICD-10-CM | POA: Diagnosis not present

## 2021-09-26 DIAGNOSIS — J309 Allergic rhinitis, unspecified: Secondary | ICD-10-CM | POA: Diagnosis not present

## 2021-09-26 NOTE — Progress Notes (Signed)
[facility] [address] [place] [address] Dept: [phone] Dept Fax: [phone]  Office Visit  Subjective:    Patient ID: Betty [name], female    DOB: [birth year], 76 y.o..   MRN: [id number]  Chief Complaint  Patient presents with   Acute Visit    C/o sore throat & dry cough x 3 days  No other concerns     History of Present Illness:  Patient is in today complaining of a 3 day history of sore throat. She has been using various approaches at home to soothe this. She admits to some nasal congestion and rhinorrhea, as well as post-nasal drip. She has used some nasal saline spray and recently restarted use of fluticasone nasal spray. She denies fever. she has not been having significant cough.  Ms. Lias also complains of difficulty swallowing some of her pills and foods at times. She has a history of dysphagia and GERD. She is managed on omeprazole.  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   Gastroesophageal reflux disease 10/21/2018   Hyperlipidemia 10/21/2018   History of pulmonary embolus (PE) - Dx'd 05/2018  06/21/2018   Past Surgical History:  Procedure Laterality Date   CESAREAN SECTION     EYE SURGERY     LAPAROSCOPIC APPENDECTOMY N/A 07/06/2019   Procedure: APPENDECTOMY LAPAROSCOPIC, TAP BLOCK;  Surgeon: Karie Soda, MD;  Location: [facility];  Service: General;  Laterality: N/A;   Family History  Problem Relation Age of  Onset   Hypertension Mother    Hypertension Father    Alzheimer's disease Father    Cancer Daughter        Breast   Cancer Paternal Aunt    Heart disease Paternal Grandmother    Outpatient Medications Prior to Visit  Medication Sig Dispense Refill   acyclovir ointment (ZOVIRAX) 5 % Apply topically every 3 (three) hours. 15 g 11   Ascorbic Acid (VITAMIN C) 500 MG CAPS Take 500 capsules by mouth daily.     aspirin 81 MG EC tablet Take 81 mg by mouth daily. Swallow whole.     b complex vitamins capsule Take 1 capsule by mouth daily.     carboxymethylcellulose (REFRESH PLUS) 0.5 % SOLN 1 drop 3 (three) times daily as needed.     cholecalciferol (VITAMIN D3) 25 MCG (1000 UNIT) tablet Take 1,000 Units by mouth daily.     fluticasone (FLONASE) 50 MCG/ACT nasal spray Place 1 spray into both nostrils daily. 16 g 6   LORATADINE PO Take by mouth.     Multiple Vitamin (MULTIVITAMIN WITH MINERALS) TABS tablet Take 1 tablet by mouth daily.     naproxen (NAPROSYN) 500 MG tablet TAKE 1 TABLET BY MOUTH TWICE DAILY WITH A MEAL 30 tablet 2   omeprazole (PRILOSEC) 20 MG capsule Take 1 capsule (20 mg total) by mouth daily. 90 capsule 3   pravastatin (PRAVACHOL) 10 MG tablet Take 1 tablet (10 mg total) by mouth daily. 90 tablet 1   vitamin B-12 (CYANOCOBALAMIN) 100 MCG  tablet Take 100 mcg by mouth every other day.     neomycin-polymyxin-hydrocortisone (CORTISPORIN) OTIC solution Place 3 drops into the right ear 3 (three) times daily. (Patient not taking: Reported on 08/12/2021) 10 mL 0   No facility-administered medications prior to visit.   Allergies  Allergen Reactions   Sulfa Antibiotics Other (See Comments)    bristers   Objective:   Today's Vitals   09/26/21 1515  BP: 114/74  Pulse: 69  Temp: (!) 97.4 F (36.3 C)  TempSrc: Temporal  SpO2: (!) 87%  Weight: 139 lb 12.8 oz (63.4 kg)  Height: 5\' 3"  (1.6 m)   Body mass index is 24.76 kg/m.   General: Well developed, well nourished. No acute  distress. HEENT: Normocephalic, non-traumatic. Conjunctiva clear. External ears normal. EAC and TMs normal bilaterally.   Nose with swollen, pale mucosa and mild whitish rhinorrhea. Mucous membranes moist. Mild mucous streaking of   the posterior oropharynx. Neck: Supple. No lymphadenopathy. No thyromegaly. Lungs: Clear to auscultation bilaterally. No wheezing, rales or rhonchi. Psych: Alert and oriented. Normal mood and affect.  Health Maintenance Due  Topic Date Due   HEMOGLOBIN A1C  04/22/2021   INFLUENZA VACCINE  09/23/2021     Assessment & Plan:   1. Sore throat Discussed home care for viral illness, including rest, pushing fluids, and OTC medications as needed for symptom relief. Recommend hot tea with honey for sore throat symptoms. Follow-up if needed for worsening or persistent symptoms.  2. Allergic rhinitis, unspecified seasonality, unspecified trigger I recommend she continue use of the fluticasone spray, 1 spray each nostril daily.  3. Gastroesophageal reflux disease, unspecified whether esophagitis present 4. Dysphagia, unspecified type Continue Prilosec 20 mg daily. I will refer her to GI to consider a possible EGD to assess her dysphagia.  - Ambulatory referral to Gastroenterology   Return if symptoms worsen or fail to improve.   11/23/2021, MD

## 2021-10-03 ENCOUNTER — Encounter: Payer: Self-pay | Admitting: Physical Therapy

## 2021-10-03 ENCOUNTER — Ambulatory Visit: Payer: Medicare Other | Admitting: Physical Therapy

## 2021-10-03 DIAGNOSIS — M6281 Muscle weakness (generalized): Secondary | ICD-10-CM | POA: Diagnosis not present

## 2021-10-03 DIAGNOSIS — M25662 Stiffness of left knee, not elsewhere classified: Secondary | ICD-10-CM

## 2021-10-03 DIAGNOSIS — R262 Difficulty in walking, not elsewhere classified: Secondary | ICD-10-CM | POA: Diagnosis not present

## 2021-10-03 DIAGNOSIS — M25562 Pain in left knee: Secondary | ICD-10-CM

## 2021-10-03 DIAGNOSIS — M79671 Pain in right foot: Secondary | ICD-10-CM

## 2021-10-03 NOTE — Therapy (Signed)
OUTPATIENT PHYSICAL THERAPY TREATMENT NOTE   Patient Name: Betty [name] MRN: [id number] DOB:11-Jun-1945, 76 y.o., female Today's Date: 10/03/2021   END OF SESSION:   PT End of Session - 10/03/21 1020     Visit Number 2    Number of Visits 12    Date for PT Re-Evaluation 11/07/21    Progress Note Due on Visit 10    PT Start Time 0931    PT Stop Time 1010    PT Time Calculation (min) 39 min    Activity Tolerance Patient tolerated treatment well    Behavior During Therapy [facility] for tasks assessed/performed             Past Medical History:  Diagnosis Date   Diabetes mellitus without complication ([place])    borderline   High cholesterol    History of pulmonary embolus (PE) - Dx'd 05/2018  06/21/2018   Past Surgical History:  Procedure Laterality Date   CESAREAN SECTION     EYE SURGERY     LAPAROSCOPIC APPENDECTOMY N/A 07/06/2019   Procedure: APPENDECTOMY LAPAROSCOPIC, TAP BLOCK;  Surgeon: Michael Boston, MD;  Location: [facility];  Service: General;  Laterality: N/A;   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   Gastroesophageal reflux disease 10/21/2018   Hyperlipidemia 10/21/2018   History of pulmonary embolus (PE) - Dx'd 05/2018  06/21/2018     THERAPY DIAG:  Acute pain of left knee  Pain in right foot  Stiffness of left knee, not elsewhere classified  Muscle weakness (generalized)  Difficulty in walking, not elsewhere classified  PCP: Haydee Salter MD  REFERRING PROVIDER: Frankey Shown,  MD  REFERRING DIAG: [phone] (ICD-10-CM) - Acute pain of left knee M79.671 (ICD-10-CM) - Pain in right foot  Rationale for Evaluation and Treatment Rehabilitation  ONSET DATE: about 1 month ago  SUBJECTIVE:   SUBJECTIVE STATEMENT: Has been prioritizing ankle exercises, but not doing knee exercises as frequently  PERTINENT HISTORY: Left patellar dislocation Rt plantar fascitis High cholesterol, DM, history of PE 2020  PAIN: Are you having pain? Yes: NPRS scale: 0/10 Pain location: Left knee, Rt foot first thing in morning is worse pain Pain description: achy, tightness Aggravating factors: foot hurts worse in morning, knee hurts worse with bending Relieving factors: voltaran gel, prescription meds  PRECAUTIONS: None  PATIENT GOALS: Be able to walk without pain, decrease my pain in my Rt foot when first getting up and after sitting.    OBJECTIVE:   PATIENT SURVEYS:  09/23/21: FOTO 47% predicted 60%    SENSATION: 09/23/21: WFL  EDEMA:  09/23/21: Circumferential: Left knee 35 centimeters, Rt knee 36 centimeters, pt stating her Rt knee has always been bigger than her left  PALPATION: 09/23/21: TTP: plantar fascia Rt foot, calcaneal tubercle, medial knee pain  LOWER EXTREMITY ROM:  A=active P=passive  ROM Right 09/23/21 Left 09/23/21  Knee flexion A: 136 P: 138 A: 130 P: 135  Knee extension    Ankle dorsiflexion A: 5  P: 8  A: 12 P: 15  Ankle plantarflexion A: 20 P:  24 A: 22 P: 24  Ankle inversion A: 30 P: 40 A: 30 P: 42  Ankle eversion A: 45 P: 50 A: 45 P: 50   (Blank rows = not tested)  LOWER EXTREMITY MMT:  MMT Right eval Left eval  Hip flexion 4/5 4/5  Hip extension    Hip abduction 4/5 4/5  Hip adduction 4/5 4/5  Hip internal rotation    Hip external rotation    Knee flexion 5/5 4/5  Knee extension 5/5 4/5  Ankle dorsiflexion 5/5 5/5  Ankle plantarflexion 5/5 5/5  Ankle inversion 4+/5 5/5  Ankle eversion 4+/5 5/5   (Blank rows = not  tested)    FUNCTIONAL TESTS:  09/23/21: 5 times sit to stand: 14 seconds no UE support  GAIT:  09/23/21:  Distance walked: 50 feet Assistive device utilized: None Level of assistance: Complete Independence Comments: mild antalgic gait off Rt foot during stance    TODAY'S TREATMENT: 10/03/21 Therex: NuStep L6 x 6 min Standing gastroc stretch 2x30 sec; Rt Standing soleus stretch 2x30 sec; Rt Calf raise x 20 reps Lt LAQ with ball squeeze; 3# 2x10; 3 sec hold Supine SLR Lt x 10 reps Supine SLR with ER on Lt x 10 reps Sidelying Lt hip adduction x 10 reps   Manual: Single strip of KT tape lateral to medial across Lt knee on 50% stretch applied; discussed care with tape and expected benefit to it  Self-Care: Assessed knee brace which fits pt well and discussed when to wear Showed night splint for plantar fasciitis to trial, pt to purchase  09/23/21:  HEP instruction/performance c cues for techniques, handout provided.  Trial set performed of each for comprehension and symptom assessment.  See below for exercise list.   PATIENT EDUCATION:  Education details: PT POC, HEP Person educated: Patient Education method: Explanation, Demonstration, Tactile cues, Verbal cues, and Handouts Education comprehension: verbalized understanding and returned demonstration   HOME EXERCISE PROGRAM: Access Code: [email or web address] URL: [email or web address] Date: 09/23/2021 Prepared by: Kearney Hard  Exercises - Seated Ankle Alphabet  - 2-3 x daily - 7 x weekly - 1 reps - Gastroc Stretch on Wall  - 2-3 x daily - 7 x weekly - 3 reps - 20 seconds hold - Seated Hamstring Stretch  - 2-3 x daily - 7 x weekly - 3 reps - 20 seocnds hold - Supine Knee Extension Strengthening  - 2-3 x daily - 7 x weekly - 2 sets - 10 reps - 5 seconds hold - Supine Active Straight Leg Raise  - 2-3 x daily - 7 x weekly - 2 sets - 10 reps - Supine Bridge  - 2-3 x daily - 7 x weekly - 2 sets - 10 reps - 5 seconds  hold  ASSESSMENT:  CLINICAL IMPRESSION: Pt tolerated session well today with review of HEP as well as additional VMO exercises.  No goals met as only 2nd visit.  Will continue to benefit from PT to address deficits listed.  OBJECTIVE IMPAIRMENTS decreased activity tolerance, decreased mobility, difficulty walking, decreased ROM, decreased strength, increased edema, impaired flexibility, and pain.   ACTIVITY LIMITATIONS lifting, bending, standing, stairs, and dressing  PARTICIPATION LIMITATIONS: driving and community activity, occupation where she sits as needed  PERSONAL FACTORS Left patellar dislocation Rt plantar fascitis High cholesterol, DM, history of PE 2020 are also affecting patient's functional outcome.   REHAB POTENTIAL: Good  CLINICAL DECISION MAKING: Stable/uncomplicated  EVALUATION COMPLEXITY: Low   GOALS: Goals reviewed with patient? Yes  SHORT  TERM GOALS: Target date: 10/14/2021  Patient will demonstrate independent use of initial home exercise program to maintain progress from in clinic treatments. Goal status: New  [place] TERM GOALS: Target date: 11/04/2021  Patient will demonstrate/report pain at worst less than or equal to 2/10 to facilitate minimal limitation in daily activity secondary to pain symptoms. Goal status: New   Patient will demonstrate independent use of home exercise program to facilitate ability to maintain/progress functional gains from skilled physical therapy services. Goal status: New   Patient will demonstrate FOTO outcome > or = 60 % to indicate reduced disability due to condition. Goal status: New   Pt will improve her Rt DF to >/= 10 degrees active ROM to improve gait. Goal status: New       5.  Pt will improve her left knee strength to grossly 5/5 in order to improve functional mobility and gait.   PLAN: PT FREQUENCY: 1-2x/week  PT DURATION: 6 weeks  PLANNED INTERVENTIONS: Therapeutic exercises, Therapeutic activity,  Neuromuscular re-education, Balance training, Gait training, Patient/Family education, Self Care, Joint mobilization, Dry Needling, Electrical stimulation, Cryotherapy, Moist heat, Taping, Ultrasound, Ionotophoresis 70m/ml Dexamethasone, and Manual therapy  PLAN FOR NEXT SESSION:  review HEP as needed, knee strengthening, assess tape left patella, add ankle band exercises as tolerated      SLaureen Abrahams PT, DPT 10/03/21 10:20 AM

## 2021-10-08 ENCOUNTER — Encounter: Payer: Medicare Other | Admitting: Physical Therapy

## 2021-10-14 ENCOUNTER — Ambulatory Visit: Payer: Medicare Other | Admitting: Physical Therapy

## 2021-10-14 ENCOUNTER — Encounter: Payer: Self-pay | Admitting: Physical Therapy

## 2021-10-14 DIAGNOSIS — M79671 Pain in right foot: Secondary | ICD-10-CM | POA: Diagnosis not present

## 2021-10-14 DIAGNOSIS — M6281 Muscle weakness (generalized): Secondary | ICD-10-CM | POA: Diagnosis not present

## 2021-10-14 DIAGNOSIS — M25562 Pain in left knee: Secondary | ICD-10-CM

## 2021-10-14 DIAGNOSIS — M25662 Stiffness of left knee, not elsewhere classified: Secondary | ICD-10-CM

## 2021-10-14 DIAGNOSIS — R262 Difficulty in walking, not elsewhere classified: Secondary | ICD-10-CM | POA: Diagnosis not present

## 2021-10-14 NOTE — Therapy (Signed)
OUTPATIENT PHYSICAL THERAPY TREATMENT NOTE   Patient Name: Betty [name] MRN: [id number] DOB:06-04-1945, 76 y.o., female Today's Date: 10/14/2021   END OF SESSION:   PT End of Session - 10/14/21 1101     Visit Number 3    Number of Visits 12    Date for PT Re-Evaluation 11/07/21    Progress Note Due on Visit 10    PT Start Time 1015    PT Stop Time 1058    PT Time Calculation (min) 43 min    Activity Tolerance Patient tolerated treatment well    Behavior During Therapy [facility] for tasks assessed/performed              Past Medical History:  Diagnosis Date   Diabetes mellitus without complication ([facility])    borderline   High cholesterol    History of pulmonary embolus (PE) - Dx'd 05/2018  06/21/2018   Past Surgical History:  Procedure Laterality Date   CESAREAN SECTION     EYE SURGERY     LAPAROSCOPIC APPENDECTOMY N/A 07/06/2019   Procedure: APPENDECTOMY LAPAROSCOPIC, TAP BLOCK;  Surgeon: Karie Soda, MD;  Location: [facility];  Service: General;  Laterality: N/A;   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   Gastroesophageal reflux disease 10/21/2018   Hyperlipidemia 10/21/2018   History of pulmonary embolus (PE) - Dx'd 05/2018  06/21/2018     THERAPY DIAG:  Acute pain of left knee  Pain in right foot  Stiffness of left knee, not elsewhere classified  Muscle weakness (generalized)  Difficulty in walking, not elsewhere classified  PCP: Loyola Mast MD  REFERRING PROVIDER: Gershon Mussel,  MD  REFERRING DIAG: [phone] (ICD-10-CM) - Acute pain of left knee M79.671 (ICD-10-CM) - Pain in right foot  Rationale for Evaluation and Treatment Rehabilitation  ONSET DATE: about 1 month ago  SUBJECTIVE:   SUBJECTIVE STATEMENT: No pain upon arrival. Pt stating her tape fell off shortly after leaving her last visit. Pt stating she feels it helped with added support.   PERTINENT HISTORY: Left patellar dislocation Rt plantar fascitis High cholesterol, DM, history of PE 2020  PAIN: Are you having pain? no: NPRS scale: 0/10 Pain location: Left knee, Rt foot first thing in morning is worse pain Pain description: achy, tightness Aggravating factors: foot hurts worse in morning, knee hurts worse with bending Relieving factors: voltaran gel, prescription meds  PRECAUTIONS: None  PATIENT GOALS: Be able to walk without pain, decrease my pain in my Rt foot when first getting up and after sitting.    OBJECTIVE:   PATIENT SURVEYS:  09/23/21: FOTO 47% predicted 60%    SENSATION: 09/23/21: WFL  EDEMA:  09/23/21: Circumferential: Left knee 35 centimeters, Rt knee 36 centimeters, pt stating her Rt knee has always been bigger than her left  PALPATION: 09/23/21: TTP: plantar fascia Rt foot, calcaneal tubercle, medial knee pain  LOWER EXTREMITY ROM:  A=active P=passive  ROM Right 09/23/21 Left 09/23/21  Knee flexion A: 136 P: 138 A: 130 P: 135  Knee extension    Ankle dorsiflexion A:  5  P: 8  A: 12 P: 15  Ankle plantarflexion A: 20 P: 24 A: 22 P: 24  Ankle inversion A: 30 P: 40 A: 30 P: 42  Ankle eversion A: 45 P: 50 A: 45 P: 50   (Blank rows = not tested)  LOWER EXTREMITY MMT:  MMT Right eval Left eval  Hip flexion 4/5 4/5  Hip extension    Hip abduction 4/5 4/5  Hip adduction 4/5 4/5  Hip internal rotation    Hip external rotation    Knee flexion 5/5 4/5  Knee extension 5/5 4/5  Ankle dorsiflexion 5/5 5/5  Ankle plantarflexion 5/5 5/5  Ankle inversion 4+/5 5/5   Ankle eversion 4+/5 5/5   (Blank rows = not tested)    FUNCTIONAL TESTS:  09/23/21: 5 times sit to stand: 14 seconds no UE support  GAIT:  09/23/21:  Distance walked: 50 feet Assistive device utilized: None Level of assistance: Complete Independence Comments: mild antalgic gait off Rt foot during stance    TODAY'S TREATMENT: 10/14/21 Therex: Standing gastroc stretch 60 sec on slant board Standing soleus stretch 60 sec on slant board Calf raise x 20 reps Sit to stand with ball squeezes 2 x 10 Bridge with ball squeeze  x 10 holding 5 sec Supine SLR with ER on Lt  2 x 10 reps Standing Lt hip adduction 2 x 10 reps 4 way ankle exercises c level 2 band  2 x 10 on Rt Manual: Single strip of KT tape lateral to medial across Lt knee on 50% stretch applied; reviewed care  IASTM to plantar surface of left foot x 8 minutes  10/03/21 Therex: NuStep L6 x 6 min Standing gastroc stretch 2x30 sec; Rt Standing soleus stretch 2x30 sec; Rt Calf raise x 20 reps Lt LAQ with ball squeeze; 3# 2x10; 3 sec hold Supine SLR Lt x 10 reps Supine SLR with ER on Lt x 10 reps Sidelying Lt hip adduction x 10 reps   Manual: Single strip of KT tape lateral to medial across Lt knee on 50% stretch applied; discussed care with tape and expected benefit to it  Self-Care: Assessed knee brace which fits pt well and discussed when to wear Showed night splint for plantar fasciitis to trial, pt to purchase  09/23/21:  HEP instruction/performance c cues for techniques, handout provided.  Trial set performed of each for comprehension and symptom assessment.  See below for exercise list.   PATIENT EDUCATION:  Education details: PT POC, HEP Person educated: Patient Education method: Explanation, Demonstration, Tactile cues, Verbal cues, and Handouts Education comprehension: verbalized understanding and returned demonstration   HOME EXERCISE PROGRAM: Access Code: [email or web address] URL:  [email or web address] Date: 09/23/2021 Prepared by: Narda Amber  Exercises - Seated Ankle Alphabet  - 2-3 x daily - 7 x weekly - 1 reps - Gastroc Stretch on Wall  - 2-3 x daily - 7 x weekly - 3 reps - 20 seconds hold - Seated Hamstring Stretch  - 2-3 x daily - 7 x weekly - 3 reps - 20 seocnds hold - Supine Knee Extension Strengthening  - 2-3 x daily - 7 x weekly - 2 sets - 10 reps - 5 seconds hold - Supine Active Straight Leg Raise  - 2-3 x daily - 7 x weekly - 2 sets - 10 reps - Supine Bridge  - 2-3 x daily - 7 x weekly - 2 sets - 10 reps - 5 seconds hold  ASSESSMENT:  CLINICAL IMPRESSION: Pt tolerated session  well today with no pain reported. Pt stating the tape from last visit gave her extra support, but it didn't stay on [place] after she left the clinic. Pt wishing to try taping again with first cleaning skin prior to taping due to pt reporting she was wearing lotion. Treatment focusing on LE strengthening toward goals set. Continue skilled PT to maximize pt's function.   OBJECTIVE IMPAIRMENTS decreased activity tolerance, decreased mobility, difficulty walking, decreased ROM, decreased strength, increased edema, impaired flexibility, and pain.   ACTIVITY LIMITATIONS lifting, bending, standing, stairs, and dressing  PARTICIPATION LIMITATIONS: driving and community activity, occupation where she sits as needed  PERSONAL FACTORS Left patellar dislocation Rt plantar fascitis High cholesterol, DM, history of PE 2020 are also affecting patient's functional outcome.   REHAB POTENTIAL: Good  CLINICAL DECISION MAKING: Stable/uncomplicated  EVALUATION COMPLEXITY: Low   GOALS: Goals reviewed with patient? Yes  SHORT TERM GOALS: Target date: 10/14/2021  Patient will demonstrate independent use of initial home exercise program to maintain progress from in clinic treatments. Goal status: New  [place] TERM GOALS: Target date: 11/04/2021  Patient will demonstrate/report pain  at worst less than or equal to 2/10 to facilitate minimal limitation in daily activity secondary to pain symptoms. Goal status: New   Patient will demonstrate independent use of home exercise program to facilitate ability to maintain/progress functional gains from skilled physical therapy services. Goal status: New   Patient will demonstrate FOTO outcome > or = 60 % to indicate reduced disability due to condition. Goal status: New   Pt will improve her Rt DF to >/= 10 degrees active ROM to improve gait. Goal status: New       5.  Pt will improve her left knee strength to grossly 5/5 in order to improve functional mobility and gait.   PLAN: PT FREQUENCY: 1-2x/week  PT DURATION: 6 weeks  PLANNED INTERVENTIONS: Therapeutic exercises, Therapeutic activity, Neuromuscular re-education, Balance training, Gait training, Patient/Family education, Self Care, Joint mobilization, Dry Needling, Electrical stimulation, Cryotherapy, Moist heat, Taping, Ultrasound, Ionotophoresis 4mg /ml Dexamethasone, and Manual therapy  PLAN FOR NEXT SESSION: assess tape response and re-tape if pt desires, left  knee strengthening, rt ankle exercises      , PT, MPT 10/14/21 11:04 AM   10/14/21 11:04 AM

## 2021-10-21 ENCOUNTER — Telehealth: Payer: Self-pay | Admitting: Physical Therapy

## 2021-10-21 ENCOUNTER — Encounter: Payer: Medicare Other | Admitting: Physical Therapy

## 2021-10-21 NOTE — Telephone Encounter (Signed)
LVM for pt as she missed her scheduled PT appt today.  Advised to call the office if she needs to reschedule, and provided next scheduled appt time.  Clarita Crane, PT, DPT 10/21/21 9:03 AM

## 2021-10-29 ENCOUNTER — Encounter: Payer: Self-pay | Admitting: Physical Therapy

## 2021-10-29 ENCOUNTER — Ambulatory Visit: Payer: Medicare Other | Admitting: Physical Therapy

## 2021-10-29 DIAGNOSIS — M25562 Pain in left knee: Secondary | ICD-10-CM

## 2021-10-29 DIAGNOSIS — M6281 Muscle weakness (generalized): Secondary | ICD-10-CM

## 2021-10-29 DIAGNOSIS — R262 Difficulty in walking, not elsewhere classified: Secondary | ICD-10-CM | POA: Diagnosis not present

## 2021-10-29 DIAGNOSIS — M79671 Pain in right foot: Secondary | ICD-10-CM | POA: Diagnosis not present

## 2021-10-29 NOTE — Therapy (Signed)
OUTPATIENT PHYSICAL THERAPY TREATMENT NOTE   Patient Name: Betty [name] MRN: [id number] DOB:[birth year], 76 y.o., female Today's Date: 10/29/2021   END OF SESSION:   PT End of Session - 10/29/21 1011     Visit Number 4    Number of Visits 12    Date for PT Re-Evaluation 11/07/21    Progress Note Due on Visit 10    PT Start Time 0847    PT Stop Time 0934    PT Time Calculation (min) 47 min    Activity Tolerance Patient tolerated treatment well    Behavior During Therapy [facility] for tasks assessed/performed               Past Medical History:  Diagnosis Date   Diabetes mellitus without complication ([place])    borderline   High cholesterol    History of pulmonary embolus (PE) - Dx'd 05/2018  06/21/2018   Past Surgical History:  Procedure Laterality Date   CESAREAN SECTION     EYE SURGERY     LAPAROSCOPIC APPENDECTOMY N/A 07/06/2019   Procedure: APPENDECTOMY LAPAROSCOPIC, TAP BLOCK;  Surgeon: Michael Boston, MD;  Location: [facility];  Service: General;  Laterality: N/A;   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   Gastroesophageal reflux disease 10/21/2018   Hyperlipidemia 10/21/2018   History of pulmonary embolus (PE) - Dx'd 05/2018  06/21/2018     THERAPY DIAG:  Acute pain of left knee  Pain in right foot  Muscle weakness (generalized)  Difficulty in walking, not elsewhere classified  PCP: Haydee Salter MD  REFERRING PROVIDER: Frankey Shown, MD  REFERRING DIAG: [phone] (ICD-10-CM) - Acute pain of  left knee M79.671 (ICD-10-CM) - Pain in right foot  Rationale for Evaluation and Treatment Rehabilitation  ONSET DATE: about 1 month ago  SUBJECTIVE:   SUBJECTIVE STATEMENT: Pt reporting pain of 8-9/10 in Rt heel first thing in the morning. Pt stating once she is up and walking on it for a while the pain goes away.  Pt reporting no pain upon arrival to clinic today.   PERTINENT HISTORY: Left patellar dislocation Rt plantar fascitis High cholesterol, DM, history of PE 2020  PAIN: Are you having pain? no: NPRS scale: 0/10 Pain location: Left knee, Rt foot first thing in morning is worse pain Pain description: achy, tightness Aggravating factors: foot hurts worse in morning, knee hurts worse with bending Relieving factors: voltaran gel, prescription meds  PRECAUTIONS: None  PATIENT GOALS: Be able to walk without pain, decrease my pain in my Rt foot when first getting up and after sitting.    OBJECTIVE:   PATIENT SURVEYS:  09/23/21: FOTO 47% predicted 60%    SENSATION: 09/23/21: WFL  EDEMA:  09/23/21: Circumferential: Left knee 35 centimeters, Rt knee 36 centimeters, pt stating her Rt knee has always been bigger than her left  PALPATION: 09/23/21: TTP: plantar fascia Rt foot, calcaneal tubercle, medial knee pain  LOWER EXTREMITY ROM:  A=active P=passive  ROM Right 09/23/21 Left 09/23/21 Rt 10/29/21 Left  10/29/21  Knee flexion A: 136 P: 138  A: 130 P: 135 A:135 P: 138 A: 134 P: 136  Knee extension   A: 0 A: 0  Ankle dorsiflexion A: 5  P: 8  A: 12 P: 15 A:10 P: 15    Ankle plantarflexion A: 20 P: 24 A: 22 P: 24 A:30   Ankle inversion A: 30 P: 40 A: 30 P: 42    Ankle eversion A: 45 P: 50 A: 45 P: 50     (Blank rows = not tested)  LOWER EXTREMITY MMT:  MMT Right eval Left eval  Hip flexion 4/5 4/5  Hip extension    Hip abduction 4/5 4/5  Hip adduction 4/5 4/5  Hip internal rotation    Hip external rotation    Knee flexion 5/5 4/5  Knee extension 5/5 4/5   Ankle dorsiflexion 5/5 5/5  Ankle plantarflexion 5/5 5/5  Ankle inversion 4+/5 5/5  Ankle eversion 4+/5 5/5   (Blank rows = not tested)    FUNCTIONAL TESTS:  09/23/21: 5 times sit to stand: 14 seconds no UE support  GAIT:  09/23/21:  Distance walked: 50 feet Assistive device utilized: None Level of assistance: Complete Independence Comments: mild antalgic gait off Rt foot during stance    TODAY'S TREATMENT: 10/29/21 Therex: Standing gastroc stretch 60 sec on slant board Standing soleus stretch 60 sec on slant board Calf raise x 20 reps Sit to stand with ball squeezes 2 x 10 Seated SLR with ER 2 x 10 4 way ankle exercises c level 2 band  2 x 10 on Rt Manual: Single strip of KT tape lateral to medial across Lt knee on 50% stretch applied; reviewed care  IASTM to plantar surface of left foot x 8 minutes  10/14/21 Therex: Standing gastroc stretch 60 sec on slant board Standing soleus stretch 60 sec on slant board Calf raise x 20 reps Sit to stand with ball squeezes 2 x 10 Bridge with ball squeeze  x 10 holding 5 sec Supine SLR with ER on Lt  2 x 10 reps Standing Lt hip adduction 2 x 10 reps 4 way ankle exercises c level 2 band  2 x 10 on Rt Manual: Single strip of KT tape lateral to medial across Lt knee on 50% stretch applied; reviewed care  IASTM to plantar surface of left foot x 8 minutes  10/03/21 Therex: NuStep L6 x 6 min Standing gastroc stretch 2x30 sec; Rt Standing soleus stretch 2x30 sec; Rt Calf raise x 20 reps Lt LAQ with ball squeeze; 3# 2x10; 3 sec hold Supine SLR Lt x 10 reps Supine SLR with ER on Lt x 10 reps Sidelying Lt hip adduction x 10 reps   Manual: Single strip of KT tape lateral to medial across Lt knee on 50% stretch applied; discussed care with tape and expected benefit to it  Self-Care: Assessed knee brace which fits pt well and discussed when to wear Showed night splint for plantar fasciitis to trial, pt to purchase     PATIENT  EDUCATION:  Education details: PT POC, HEP Person educated: Patient Education method: Explanation, Demonstration, Tactile cues, Verbal cues, and Handouts Education comprehension: verbalized understanding and returned demonstration   HOME EXERCISE PROGRAM: Access Code: [email or web address] URL: [email or web address] Date: 09/23/2021 Prepared by: Kearney Hard  Exercises - Seated Ankle Alphabet  - 2-3 x daily - 7 x weekly - 1 reps - Gastroc Stretch on Wall  - 2-3 x daily - 7 x weekly - 3 reps - 20 seconds hold - Seated Hamstring  Stretch  - 2-3 x daily - 7 x weekly - 3 reps - 20 seocnds hold - Supine Knee Extension Strengthening  - 2-3 x daily - 7 x weekly - 2 sets - 10 reps - 5 seconds hold - Supine Active Straight Leg Raise  - 2-3 x daily - 7 x weekly - 2 sets - 10 reps - Supine Bridge  - 2-3 x daily - 7 x weekly - 2 sets - 10 reps - 5 seconds hold  ASSESSMENT:  CLINICAL IMPRESSION: Pt tolerated session well today. Pt reporting best relief from soft tissue mobs last visit. IASTM and taping repeated. Pt was edu in how to place tape and issued a pre-cut piece of tape once the tape applied in the clinic comes off. Pt able to demonstrate placement of the tape and application. We added IT band stretch to pt's HEP and hip stretching due to tightness and new complaints of Rt hip pain with ADL's.  Continue skilled PT to maximize pt's function.   OBJECTIVE IMPAIRMENTS decreased activity tolerance, decreased mobility, difficulty walking, decreased ROM, decreased strength, increased edema, impaired flexibility, and pain.   ACTIVITY LIMITATIONS lifting, bending, standing, stairs, and dressing  PARTICIPATION LIMITATIONS: driving and community activity, occupation where she sits as needed  PERSONAL FACTORS Left patellar dislocation Rt plantar fascitis High cholesterol, DM, history of PE 2020 are also affecting patient's functional outcome.   REHAB POTENTIAL: Good  CLINICAL DECISION MAKING:  Stable/uncomplicated  EVALUATION COMPLEXITY: Low   GOALS: Goals reviewed with patient? Yes  SHORT TERM GOALS: Target date: 10/14/2021  Patient will demonstrate independent use of initial home exercise program to maintain progress from in clinic treatments. Goal status: MET 10/29/21  [place] TERM GOALS: Target date: 11/04/2021  Patient will demonstrate/report pain at worst less than or equal to 2/10 to facilitate minimal limitation in daily activity secondary to pain symptoms. Goal status: on-going 10/29/21   Patient will demonstrate independent use of home exercise program to facilitate ability to maintain/progress functional gains from skilled physical therapy services. Goal status: on-going 10/29/21   Patient will demonstrate FOTO outcome > or = 60 % to indicate reduced disability due to condition. Goal status: New   Pt will improve her Rt DF to >/= 10 degrees active ROM to improve gait. Goal status: MET 10/29/21       5.  Pt will improve her left knee strength to grossly 5/5 in order to improve functional mobility and gait.  A: Goal Status: New  PLAN: PT FREQUENCY: 1-2x/week  PT DURATION: 6 weeks  PLANNED INTERVENTIONS: Therapeutic exercises, Therapeutic activity, Neuromuscular re-education, Balance training, Gait training, Patient/Family education, Self Care, Joint mobilization, Dry Needling, Electrical stimulation, Cryotherapy, Moist heat, Taping, Ultrasound, Ionotophoresis 17m/ml Dexamethasone, and Manual therapy  PLAN FOR NEXT SESSION: re-tape left knee if pt desires, left  knee strengthening, rt ankle exercises      JKearney Hard PT, MPT 10/29/21 10:13 AM   10/29/21 10:13 AM

## 2021-11-06 ENCOUNTER — Ambulatory Visit: Payer: Medicare Other | Admitting: Physical Therapy

## 2021-11-06 ENCOUNTER — Encounter: Payer: Self-pay | Admitting: Physical Therapy

## 2021-11-06 DIAGNOSIS — M25562 Pain in left knee: Secondary | ICD-10-CM | POA: Diagnosis not present

## 2021-11-06 DIAGNOSIS — M6281 Muscle weakness (generalized): Secondary | ICD-10-CM | POA: Diagnosis not present

## 2021-11-06 DIAGNOSIS — M79671 Pain in right foot: Secondary | ICD-10-CM | POA: Diagnosis not present

## 2021-11-06 DIAGNOSIS — M25662 Stiffness of left knee, not elsewhere classified: Secondary | ICD-10-CM | POA: Diagnosis not present

## 2021-11-06 DIAGNOSIS — R262 Difficulty in walking, not elsewhere classified: Secondary | ICD-10-CM

## 2021-11-06 NOTE — Therapy (Signed)
OUTPATIENT PHYSICAL THERAPY TREATMENT NOTE RECERTIFICATION   Patient Name: Betty [name] MRN: [id number] DOB:1945/06/14, 76 y.o., female Today's Date: 11/06/2021   END OF SESSION:   PT End of Session - 11/06/21 0926     Visit Number 5    Number of Visits 7    Date for PT Re-Evaluation 11/21/21    Progress Note Due on Visit 10    PT Start Time 0929    PT Stop Time 1009    PT Time Calculation (min) 40 min    Activity Tolerance Patient tolerated treatment well    Behavior During Therapy [facility] for tasks assessed/performed                Past Medical History:  Diagnosis Date   Diabetes mellitus without complication ([place])    borderline   High cholesterol    History of pulmonary embolus (PE) - Dx'd 05/2018  06/21/2018   Past Surgical History:  Procedure Laterality Date   CESAREAN SECTION     EYE SURGERY     LAPAROSCOPIC APPENDECTOMY N/A 07/06/2019   Procedure: APPENDECTOMY LAPAROSCOPIC, TAP BLOCK;  Surgeon: Michael Boston, MD;  Location: [facility];  Service: General;  Laterality: N/A;   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   Gastroesophageal reflux disease 10/21/2018   Hyperlipidemia 10/21/2018   History of pulmonary embolus (PE) - Dx'd 05/2018  06/21/2018     THERAPY DIAG:  Acute pain of left knee - Plan: PT plan of care cert/re-cert  Pain in right foot - Plan: PT plan of care cert/re-cert  Muscle weakness (generalized) - Plan: PT plan of care cert/re-cert  Difficulty in walking, not elsewhere  classified - Plan: PT plan of care cert/re-cert  Stiffness of left knee, not elsewhere classified - Plan: PT plan of care cert/re-cert  PCP: Haydee Salter MD  REFERRING PROVIDER: Frankey Shown, MD  REFERRING DIAG: M25.562 (ICD-10-CM) - Acute pain of left knee M79.671 (ICD-10-CM) - Pain in right foot  Rationale for Evaluation and Treatment Rehabilitation  ONSET DATE: about 1 month ago  SUBJECTIVE:   SUBJECTIVE STATEMENT: Doing well, just has some pain associated with exercise but overall doing okay.  PERTINENT HISTORY: Left patellar dislocation Rt plantar fascitis High cholesterol, DM, history of PE 2020  PAIN: Are you having pain? no: NPRS scale: 0/10 Pain location: Left knee, Rt foot first thing in morning is worse pain Pain description: achy, tightness Aggravating factors: foot hurts worse in morning, knee hurts worse with bending Relieving factors: voltaran gel, prescription meds  PRECAUTIONS: None  PATIENT GOALS: Be able to walk without pain, decrease my pain in my Rt foot when first getting up and after sitting.    OBJECTIVE:   PATIENT SURVEYS:  09/23/21: FOTO 47% predicted 60% 11/06/21: FOTO 65   EDEMA:  09/23/21: Circumferential: Left knee 35 centimeters, Rt knee 36 centimeters, pt stating her Rt knee has always been bigger than her left  PALPATION: 09/23/21: TTP: plantar fascia Rt foot, calcaneal tubercle, medial knee pain  LOWER EXTREMITY ROM:  A=active P=passive  ROM Right  09/23/21 Left 09/23/21 Rt 10/29/21 Left  10/29/21  Knee flexion A: 136 P: 138 A: 130 P: 135 A:135 P: 138 A: 134 P: 136  Knee extension   A: 0 A: 0  Ankle dorsiflexion A: 5  P: 8  A: 12 P: 15 A:10 P: 15    Ankle plantarflexion A: 20 P: 24 A: 22 P: 24 A:30   Ankle inversion A: 30 P: 40 A: 30 P: 42    Ankle eversion A: 45 P: 50 A: 45 P: 50     (Blank rows = not tested)  LOWER EXTREMITY MMT:  MMT Right eval Left eval Left 11/06/21  Hip flexion 4/5 4/5   Hip extension      Hip abduction 4/5 4/5   Hip adduction 4/5 4/5   Hip internal rotation     Hip external rotation     Knee flexion 5/5 4/5 5/5  Knee extension 5/5 4/5 5/5  Ankle dorsiflexion 5/5 5/5   Ankle plantarflexion 5/5 5/5   Ankle inversion 4+/5 5/5   Ankle eversion 4+/5 5/5    (Blank rows = not tested)    FUNCTIONAL TESTS:  09/23/21: 5 times sit to stand: 14 seconds no UE support  GAIT:  09/23/21:  Distance walked: 50 feet Assistive device utilized: None Level of assistance: Complete Independence Comments: mild antalgic gait off Rt foot during stance    TODAY'S TREATMENT: 11/06/21 Therex: NuStep L5 x 8 min Mini Squat at counter 2x10; min cues for technique Calf raises x 20 reps RDL partial range with 10# KB 2x10 Bridges 2x10; 5 sec hold MMT as noted above  Manual Removal of current tape, then single strip of KT tape lateral to medial across Lt knee on 50% stretch applied; reviewed care   10/29/21 Therex: Standing gastroc stretch 60 sec on slant board Standing soleus stretch 60 sec on slant board Calf raise x 20 reps Sit to stand with ball squeezes 2 x 10 Seated SLR with ER 2 x 10 4 way ankle exercises c level 2 band  2 x 10 on Rt Manual: Single strip of KT tape lateral to medial across Lt knee on 50% stretch applied; reviewed care  IASTM to plantar surface of left foot x 8 minutes  10/14/21 Therex: Standing gastroc stretch 60 sec on slant board Standing soleus stretch 60 sec on slant board Calf raise x 20 reps Sit to stand with ball squeezes 2 x 10 Bridge with ball squeeze  x 10 holding 5 sec Supine SLR with ER on Lt  2 x 10 reps Standing Lt hip adduction 2 x 10 reps 4 way ankle exercises c level 2 band  2 x 10 on Rt Manual: Single strip of KT tape lateral to medial across Lt knee on 50% stretch applied; reviewed care  IASTM to plantar surface of left foot x 8 minutes  10/03/21 Therex: NuStep L6 x 6 min Standing gastroc stretch 2x30 sec; Rt Standing soleus stretch  2x30 sec; Rt Calf raise x 20 reps Lt LAQ with ball squeeze; 3# 2x10; 3 sec hold Supine SLR Lt x 10 reps Supine SLR with ER on Lt x 10 reps Sidelying Lt hip adduction x 10 reps   Manual: Single strip of KT tape lateral to medial across Lt knee on 50% stretch applied; discussed care with tape and expected benefit to it  Self-Care: Assessed knee brace which fits pt well and discussed when to wear Showed night splint for plantar fasciitis to trial, pt  to purchase     PATIENT EDUCATION:  Education details: PT POC, HEP Person educated: Patient Education method: Explanation, Demonstration, Tactile cues, Verbal cues, and Handouts Education comprehension: verbalized understanding and returned demonstration   HOME EXERCISE PROGRAM: Access Code: [email or web address] URL: [email or web address] Date: 11/06/2021 Prepared by: Faustino Congress  Exercises - Seated Ankle Alphabet  - 2-3 x daily - 7 x weekly - 1 reps - Gastroc Stretch on Wall  - 2-3 x daily - 7 x weekly - 3 reps - 20 seconds hold - Seated Hamstring Stretch  - 2-3 x daily - 7 x weekly - 3 reps - 20 seocnds hold - Supine Bridge  - 2-3 x daily - 7 x weekly - 2 sets - 10 reps - 5 seconds hold - Supine ITB Stretch with Strap  - 2 x daily - 7 x weekly - 2 reps - 30 seconds hold - Ankle Dorsiflexion with Resistance  - 1-2 x daily - 7 x weekly - 2 sets - 10 reps - Ankle and Toe Plantarflexion with Resistance  - 1-2 x daily - 7 x weekly - 2 sets - 10 reps - Ankle Eversion with Resistance  - 1-2 x daily - 7 x weekly - 2 sets - 10 reps - Ankle Inversion with Resistance  - 1-2 x daily - 7 x weekly - 2 sets - 10 reps - Supine Piriformis Stretch with Foot on Ground  - 1-2 x daily - 7 x weekly - 2 reps - 30 seconds hold - Mini Squat with Counter Support  - 1 x daily - 7 x weekly - 2-3 sets - 10 reps - Heel Raises with Counter Support  - 1 x daily - 7 x weekly - 1 sets - 20 reps - Half Deadlift with Kettlebell  - 1 x daily - 7 x weekly  - 2-3 sets - 10 reps  ASSESSMENT:  CLINICAL IMPRESSION: Pt has met FOTO goal and strength goal at this time.  Anticipate we are nearing d/c.  Updated HEP to add additional strengthening exercises, pt seems fearful of pain or soreness with increased resistance.  Will continue to benefit from PT to maximize function.  OBJECTIVE IMPAIRMENTS decreased activity tolerance, decreased mobility, difficulty walking, decreased ROM, decreased strength, increased edema, impaired flexibility, and pain.   ACTIVITY LIMITATIONS lifting, bending, standing, stairs, and dressing  PARTICIPATION LIMITATIONS: driving and community activity, occupation where she sits as needed  PERSONAL FACTORS Left patellar dislocation Rt plantar fascitis High cholesterol, DM, history of PE 2020 are also affecting patient's functional outcome.   REHAB POTENTIAL: Good  CLINICAL DECISION MAKING: Stable/uncomplicated  EVALUATION COMPLEXITY: Low   GOALS: Goals reviewed with patient? Yes  SHORT TERM GOALS: Target date: 10/14/2021  Patient will demonstrate independent use of initial home exercise program to maintain progress from in clinic treatments. Goal status: MET 10/29/21  [place] TERM GOALS: Target date: 11/21/2021  Patient will demonstrate/report pain at worst less than or equal to 2/10 to facilitate minimal limitation in daily activity secondary to pain symptoms. Goal status: MET 11/06/21   Patient will demonstrate independent use of home exercise program to facilitate ability to maintain/progress functional gains from skilled physical therapy services. Goal status: on-going 11/06/21   Patient will demonstrate FOTO outcome > or = 60 % to indicate reduced disability due to condition. Goal status: MET 11/06/21   Pt will improve her Rt DF to >/= 10 degrees active ROM to improve gait. Goal status: MET 10/29/21  5.  Pt will improve her left knee strength to grossly 5/5 in order to improve functional mobility and gait.  A:  Goal Status: MET 11/06/21  PLAN: PT FREQUENCY: 1x/wk  PT DURATION: 2 weeks  PLANNED INTERVENTIONS: Therapeutic exercises, Therapeutic activity, Neuromuscular re-education, Balance training, Gait training, Patient/Family education, Self Care, Joint mobilization, Dry Needling, Electrical stimulation, Cryotherapy, Moist heat, Taping, Ultrasound, Ionotophoresis 76m/ml Dexamethasone, and Manual therapy  PLAN FOR NEXT SESSION: review/update HEP, plan for d/c next 1-2 visits     SLaureen Abrahams PT, DPT 11/06/21 10:13 AM

## 2021-11-10 DIAGNOSIS — Z1231 Encounter for screening mammogram for malignant neoplasm of breast: Secondary | ICD-10-CM | POA: Diagnosis not present

## 2021-11-10 LAB — HM MAMMOGRAPHY

## 2021-11-11 ENCOUNTER — Ambulatory Visit: Payer: Medicare Other | Admitting: Physical Therapy

## 2021-11-11 ENCOUNTER — Encounter: Payer: Self-pay | Admitting: Physical Therapy

## 2021-11-11 ENCOUNTER — Encounter: Payer: Self-pay | Admitting: Family Medicine

## 2021-11-11 DIAGNOSIS — R262 Difficulty in walking, not elsewhere classified: Secondary | ICD-10-CM

## 2021-11-11 DIAGNOSIS — M25662 Stiffness of left knee, not elsewhere classified: Secondary | ICD-10-CM | POA: Diagnosis not present

## 2021-11-11 DIAGNOSIS — M6281 Muscle weakness (generalized): Secondary | ICD-10-CM

## 2021-11-11 DIAGNOSIS — M25562 Pain in left knee: Secondary | ICD-10-CM | POA: Diagnosis not present

## 2021-11-11 DIAGNOSIS — M79671 Pain in right foot: Secondary | ICD-10-CM

## 2021-11-11 NOTE — Therapy (Addendum)
OUTPATIENT PHYSICAL THERAPY TREATMENT NOTE Discharge    Patient Name: ARRYN [name] MRN: [id number] DOB:10-13-1945, 76 y.o., female Today's Date: 11/11/2021   END OF SESSION:   PT End of Session - 11/11/21 1020     Visit Number 6    Number of Visits 7    Date for PT Re-Evaluation 11/21/21    Progress Note Due on Visit 10    PT Start Time 1017    PT Stop Time 1057    PT Time Calculation (min) 40 min    Activity Tolerance Patient tolerated treatment well    Behavior During Therapy [facility] for tasks assessed/performed                 Past Medical History:  Diagnosis Date   Diabetes mellitus without complication ([place])    borderline   High cholesterol    History of pulmonary embolus (PE) - Dx'd 05/2018  06/21/2018   Past Surgical History:  Procedure Laterality Date   CESAREAN SECTION     EYE SURGERY     LAPAROSCOPIC APPENDECTOMY N/A 07/06/2019   Procedure: APPENDECTOMY LAPAROSCOPIC, TAP BLOCK;  Surgeon: Michael Boston, MD;  Location: [facility];  Service: General;  Laterality: N/A;   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   Gastroesophageal reflux disease 10/21/2018   Hyperlipidemia 10/21/2018   History of pulmonary embolus (PE) - Dx'd 05/2018  06/21/2018     THERAPY DIAG:  Acute pain of left knee  Pain in right foot  Muscle weakness (generalized)  Difficulty in walking, not elsewhere classified  Stiffness of left knee, not elsewhere classified  PCP: Haydee Salter MD  REFERRING PROVIDER: Frankey Shown, MD  REFERRING DIAG: [phone] (ICD-10-CM) - Acute pain of left knee M79.671 (ICD-10-CM) - Pain in right foot  Rationale for Evaluation and Treatment Rehabilitation  ONSET DATE: about 1 month ago  SUBJECTIVE:   SUBJECTIVE STATEMENT: Pt reporting doing better, but her tape last visit only lasted one day.   PERTINENT HISTORY: Left patellar dislocation Rt plantar fascitis High cholesterol, DM, history of PE 2020  PAIN: Are you having pain? no: NPRS scale: 0/10 Pain location: Left knee, Rt foot first thing in morning is worse pain Pain description: achy, tightness Aggravating factors: foot hurts worse in morning, knee hurts worse with bending Relieving factors: voltaran gel, prescription meds  PRECAUTIONS: None  PATIENT GOALS: Be able to walk without pain, decrease my pain in my Rt foot when first getting up and after sitting.    OBJECTIVE:   PATIENT SURVEYS:  09/23/21: FOTO 47% predicted 60% 11/06/21: FOTO 65%   EDEMA:  09/23/21: Circumferential: Left knee 35 centimeters, Rt knee 36 centimeters, pt stating her Rt knee has always been bigger than her left  PALPATION: 09/23/21: TTP: plantar fascia Rt foot, calcaneal tubercle, medial knee pain  LOWER EXTREMITY ROM:  A=active P=passive  ROM Right 09/23/21 Left 09/23/21 Rt 10/29/21 Left  10/29/21 Left 11/11/21  Knee flexion A: 136 P: 138 A: 130 P: 135 A:135 P: 138 A: 134 P: 136 A: 135 P: 136  Knee extension   A: 0 A: 0 A:0  Ankle dorsiflexion A: 5  P: 8  A: 12 P: 15 A:10 P: 15   A: 12 P: 15  Ankle plantarflexion A: 20 P: 24 A: 22 P: 24 A:30    Ankle inversion A: 30 P: 40 A: 30 P: 42     Ankle eversion A: 45 P: 50 A: 45 P: 50      (Blank rows = not tested)  LOWER EXTREMITY MMT:  MMT Right eval Left eval Left 11/06/21  Hip flexion 4/5 4/5   Hip extension     Hip abduction 4/5 4/5   Hip adduction 4/5 4/5   Hip internal rotation     Hip external rotation     Knee flexion 5/5 4/5 5/5  Knee extension  5/5 4/5 5/5  Ankle dorsiflexion 5/5 5/5   Ankle plantarflexion 5/5 5/5   Ankle inversion 4+/5 5/5   Ankle eversion 4+/5 5/5    (Blank rows = not tested)    FUNCTIONAL TESTS:  09/23/21: 5 times sit to stand: 14 seconds no UE support  GAIT:  09/23/21:  Distance walked: 50 feet Assistive device utilized: None Level of assistance: Complete Independence Comments: mild antalgic gait off Rt foot during stance    TODAY'S TREATMENT: 11/11/21 Therex: Recumbent bike: L3 x 8 minutes Mini Squat at counter 2x10; min cues for technique Calf raises x 20 reps Hip abduction x 15 each LE Rocker board x 2 minutes with UE support both df/pf and inversion/eversion Bridges 2x10; 5 sec hold Manual Single strip of KT tape lateral to medial across Lt knee on 50% stretch applied Pt edu in how to donn the tape in case she purchases it for home use   11/06/21 Therex: NuStep L5 x 8 min Mini Squat at counter 2x10; min cues for technique Calf raises x 20 reps RDL partial range with 10# KB 2x10 Bridges 2x10; 5 sec hold MMT as noted above  Manual Removal of current tape, then single strip of KT tape lateral to medial across Lt knee on 50% stretch applied; reviewed care   10/29/21 Therex: Standing gastroc stretch 60 sec on slant board Standing soleus stretch 60 sec on slant board Calf raise x 20 reps Sit to stand with ball squeezes 2 x 10 Seated SLR with ER 2 x 10 4 way ankle exercises c level 2 band  2 x 10 on Rt Manual: Single strip of KT tape lateral to medial across Lt knee on 50% stretch applied; reviewed care  IASTM to plantar surface of left foot x 8 minutes        PATIENT EDUCATION:  Education details: PT POC, HEP Person educated: Patient Education method: Consulting civil engineer, Demonstration, Tactile cues, Verbal cues, and Handouts Education comprehension: verbalized understanding and returned demonstration   HOME EXERCISE PROGRAM: Access Code: [email or web address] URL:  [email or web address] Date: 11/06/2021 Prepared by: Faustino Congress  Exercises - Seated Ankle Alphabet  - 2-3 x daily - 7 x weekly - 1 reps - Gastroc Stretch on Wall  - 2-3 x daily - 7 x weekly - 3 reps - 20 seconds hold - Seated Hamstring Stretch  - 2-3 x daily - 7 x weekly - 3 reps - 20 seocnds hold - Supine Bridge  - 2-3 x daily - 7 x weekly - 2 sets - 10 reps - 5 seconds hold - Supine ITB Stretch with Strap  - 2 x daily - 7 x weekly - 2 reps - 30  seconds hold - Ankle Dorsiflexion with Resistance  - 1-2 x daily - 7 x weekly - 2 sets - 10 reps - Ankle and Toe Plantarflexion with Resistance  - 1-2 x daily - 7 x weekly - 2 sets - 10 reps - Ankle Eversion with Resistance  - 1-2 x daily - 7 x weekly - 2 sets - 10 reps - Ankle Inversion with Resistance  - 1-2 x daily - 7 x weekly - 2 sets - 10 reps - Supine Piriformis Stretch with Foot on Ground  - 1-2 x daily - 7 x weekly - 2 reps - 30 seconds hold - Mini Squat with Counter Support  - 1 x daily - 7 x weekly - 2-3 sets - 10 reps - Heel Raises with Counter Support  - 1 x daily - 7 x weekly - 1 sets - 20 reps - Half Deadlift with Kettlebell  - 1 x daily - 7 x weekly - 2-3 sets - 10 reps  ASSESSMENT:  CLINICAL IMPRESSION: Pt experienced a drop in her O2 sats while riding the bike. Pt's sats dropped to 85%, but recovered after 3 deep breaths to 96%. Pt was issued crackers and water and reported feeling better after getting something on her stomach. Pt's 02 sats were monitored throughout session and remained above 95%.  Planning for discharge next visit. Pt was edu in how to donn K-tape to help provide extra support to her knee.   OBJECTIVE IMPAIRMENTS decreased activity tolerance, decreased mobility, difficulty walking, decreased ROM, decreased strength, increased edema, impaired flexibility, and pain.   ACTIVITY LIMITATIONS lifting, bending, standing, stairs, and dressing  PARTICIPATION LIMITATIONS: driving and community  activity, occupation where she sits as needed  PERSONAL FACTORS Left patellar dislocation Rt plantar fascitis High cholesterol, DM, history of PE 2020 are also affecting patient's functional outcome.   REHAB POTENTIAL: Good  CLINICAL DECISION MAKING: Stable/uncomplicated  EVALUATION COMPLEXITY: Low   GOALS: Goals reviewed with patient? Yes  SHORT TERM GOALS: Target date: 10/14/2021  Patient will demonstrate independent use of initial home exercise program to maintain progress from in clinic treatments. Goal status: MET 10/29/21  [place] TERM GOALS: Target date: 11/21/2021  Patient will demonstrate/report pain at worst less than or equal to 2/10 to facilitate minimal limitation in daily activity secondary to pain symptoms. Goal status: MET 11/06/21   Patient will demonstrate independent use of home exercise program to facilitate ability to maintain/progress functional gains from skilled physical therapy services. Goal status: on-going 11/06/21   Patient will demonstrate FOTO outcome > or = 60 % to indicate reduced disability due to condition. Goal status: MET 11/06/21   Pt will improve her Rt DF to >/= 10 degrees active ROM to improve gait. Goal status: MET 10/29/21       5.  Pt will improve her left knee strength to grossly 5/5 in order to improve functional mobility and gait.  A: Goal Status: MET 11/06/21  PLAN: PT FREQUENCY: 1x/wk  PT DURATION: 2 weeks  PLANNED INTERVENTIONS: Therapeutic exercises, Therapeutic activity, Neuromuscular re-education, Balance training, Gait training, Patient/Family education, Self Care, Joint mobilization, Dry Needling, Electrical stimulation, Cryotherapy, Moist heat, Taping, Ultrasound, Ionotophoresis 73m/ml Dexamethasone, and Manual therapy  PLAN FOR NEXT SESSION: d/c next visit. Update HEP as needed    JKearney Hard PT, MPT 11/11/21 10:25 AM   11/11/21 10:25 AM   PHYSICAL THERAPY DISCHARGE SUMMARY  Visits from Start of Care: 6  Current  functional level related to goals / functional outcomes:  See above   Remaining deficits: See above   Education / Equipment: HEP   Patient agrees to discharge. Patient goals were partially met. Patient is being discharged due to not returning since the last visit. Kearney Hard, PT, MPT 12/23/21 3:33 PM

## 2021-11-17 ENCOUNTER — Ambulatory Visit (INDEPENDENT_AMBULATORY_CARE_PROVIDER_SITE_OTHER): Payer: Medicare Other

## 2021-11-17 VITALS — BP 130/73 | HR 70 | Ht 63.0 in | Wt 142.6 lb

## 2021-11-17 DIAGNOSIS — Z0000 Encounter for general adult medical examination without abnormal findings: Secondary | ICD-10-CM

## 2021-11-17 NOTE — Progress Notes (Signed)
   Subjective:     Betty [name] is a 76 y.o. female here at [facility] for a routine exam. Patient moved to area 2 years ago after she retired to be closer to daughter. Here to establish care. Current complaints: none.  Personal health questionnaire reviewed: yes.  Do you have a primary care provider? Yes Do you feel safe at home? Yes  [place] Office Visit from 11/17/2021 in [place] for [place] at [facility]  PHQ-2 Total Score 0       Health Maintenance Due  Topic Date Due   HEMOGLOBIN A1C  04/22/2021   INFLUENZA VACCINE  Never done   Diabetic kidney evaluation - GFR measurement  10/23/2021   Diabetic kidney evaluation - Urine ACR  10/23/2021    Risk factors for chronic health problems: Smoking: Denies Alchohol/how much: Denies Illicit drug use: Denies Exercise: walks 1-1.5 miles per day Pt BMI: Body mass index is 25.26 kg/m.   Gynecologic History No LMP recorded. Patient is postmenopausal. Contraception: none Sexual health: Not sexually active Last Pap: Unsure, but thinks >10 years. Results were: normal per patient. No history of abnormal paps Last mammogram: 11/10/21. Results were: still pending  Obstetric History OB History  No obstetric history on file.    The following portions of the patient's history were reviewed and updated as appropriate: allergies, current medications, past family history, past medical history, past social history, past surgical history, and problem list.  Review of Systems Pertinent items are noted in HPI.    Objective:   BP 130/73   Pulse 70   Ht 5\' 3"  (1.6 m)   Wt 142 lb 9.6 oz (64.7 kg)   BMI 25.26 kg/m  VS reviewed, nursing note reviewed,  Constitutional: well developed, well nourished, no distress HEENT: normocephalic CV: normal rate Pulm/chest wall: normal effort Breast Exam: Deferred  Abdomen: soft Neuro: alert and oriented x 3 Skin: warm, dry Psych: affect normal Pelvic exam:  Deferred    Assessment/Plan:  1. Well woman exam (no gynecological exam) - No concerns - Recent labs done by PCP - Reviewed paps not recommended after 65 - Mammogram was done last week - Patient here to establish care in case any concerns arise in the future   Return in about 1 year (around 11/18/2022) for or as needed.   Renee Harder, CNM 3:20 PM

## 2021-11-17 NOTE — Progress Notes (Signed)
Pt states that she has a Ovarian cyst but it has not given her any problems, just been in [place]  for 2 yrs. Jut want to make sure every thing is ok.

## 2021-11-18 ENCOUNTER — Telehealth: Payer: Self-pay | Admitting: Physical Therapy

## 2021-11-18 ENCOUNTER — Encounter: Payer: Medicare Other | Admitting: Physical Therapy

## 2021-11-18 NOTE — Telephone Encounter (Signed)
I called pt to follow up after she didn't show for her 8:45 PT appointment. I left a message for pt with our clinic number [phone] to call and reschedule before her cert expires on 0/53/97.  Kearney Hard, PT, MPT 11/18/21 9:14 AM

## 2022-03-25 ENCOUNTER — Telehealth: Payer: Self-pay | Admitting: Family Medicine

## 2022-03-25 ENCOUNTER — Ambulatory Visit: Payer: Medicare Other | Admitting: Family Medicine

## 2022-03-25 NOTE — Telephone Encounter (Signed)
Pt was a no show for an OV with Dr Rudd on 03/25/22, I sent a letter.  

## 2022-03-25 NOTE — Telephone Encounter (Signed)
No show 1/31 went out of town thought she would be back in time.

## 2022-03-27 NOTE — Telephone Encounter (Signed)
same day cancel/1st no show, fee waived, letter sent - pt was out of town and did not make it back in time

## 2022-04-08 ENCOUNTER — Encounter: Payer: Self-pay | Admitting: Family Medicine

## 2022-04-08 ENCOUNTER — Ambulatory Visit (INDEPENDENT_AMBULATORY_CARE_PROVIDER_SITE_OTHER): Payer: Medicare Other | Admitting: Family Medicine

## 2022-04-08 VITALS — BP 132/80 | HR 78 | Temp 97.4°F | Ht 63.0 in | Wt 141.6 lb

## 2022-04-08 DIAGNOSIS — R7303 Prediabetes: Secondary | ICD-10-CM

## 2022-04-08 DIAGNOSIS — E782 Mixed hyperlipidemia: Secondary | ICD-10-CM

## 2022-04-08 DIAGNOSIS — J069 Acute upper respiratory infection, unspecified: Secondary | ICD-10-CM

## 2022-04-08 LAB — LIPID PANEL
Cholesterol: 199 mg/dL (ref 0–200)
HDL: 39.5 mg/dL (ref >39.00)
LDL Cholesterol: 124 mg/dL — ABNORMAL HIGH (ref 0–99)
NonHDL: 159.81
Total CHOL/HDL Ratio: 5
Triglycerides: 181 mg/dL — ABNORMAL HIGH (ref 0.0–149.0)
VLDL: 36.2 mg/dL (ref 0.0–40.0)

## 2022-04-08 LAB — HEMOGLOBIN A1C: Hgb A1c MFr Bld: 6 % (ref 4.6–6.5)

## 2022-04-08 LAB — GLUCOSE, RANDOM: Glucose, Bld: 90 mg/dL (ref 70–99)

## 2022-04-08 NOTE — Progress Notes (Signed)
[facility] [address] [place] [address] Dept: [phone] Dept Fax: [phone]  Office Visit  Subjective:    Patient ID: Betty [name], female    DOB: 1945/05/08, 77 y.o..   MRN: [id number]  Chief Complaint  Patient presents with   Sinus Problem    C/o having sinus pressure/drainage, cough, HA since 04/02/22. Has been taking Mucinex, flonase.  No covid test done.     History of Present Illness:  Patient is in today for complaining of a 6-day history of  sinus headache, nasal congestion with rhinorrhea, ear congestion, and cough. She denies any fever, or sore throat. Her appetite was off. she did not do any home testing for COVID. She is using a generic Mucinex  and has felt her symptoms to improve.  Betty [name] has a history of prediabetes. Her medical record had inappropriately included a diagnosis of diabetes. Review of the record had shown her A1c to be consistently < 6% and she had never met A1c or blood sugar criteria per ADA Standards for having diabetes.  Betty [name] has a history of hyperlipidemia, She is managed on pravastatin 10 mg daily.  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   Gastroesophageal reflux disease 10/21/2018   Hyperlipidemia 10/21/2018   History of pulmonary embolus (PE) - Dx'd 05/2018  06/21/2018   Past Surgical History:  Procedure Laterality Date   CESAREAN SECTION     EYE SURGERY     LAPAROSCOPIC  APPENDECTOMY N/A 07/06/2019   Procedure: APPENDECTOMY LAPAROSCOPIC, TAP BLOCK;  Surgeon: Michael Boston, MD;  Location: [facility];  Service: General;  Laterality: N/A;   Family History  Problem Relation Age of Onset   Hypertension Mother    Hypertension Father    Alzheimer's disease Father    Cancer Daughter        Breast   Cancer Paternal Aunt    Heart disease Paternal Grandmother    Outpatient Medications Prior to Visit  Medication Sig Dispense Refill   Ascorbic Acid (VITAMIN C) 500 MG CAPS Take 500 capsules by mouth daily.     aspirin 81 MG EC tablet Take 81 mg by mouth daily. Swallow whole.     b complex vitamins capsule Take 1 capsule by mouth daily.     carboxymethylcellulose (REFRESH PLUS) 0.5 % SOLN 1 drop 3 (three) times daily as needed.     cholecalciferol (VITAMIN D3) 25 MCG (1000 UNIT) tablet Take 1,000 Units by mouth daily.     fluticasone (FLONASE) 50 MCG/ACT nasal spray Place 1 spray into both nostrils daily. 16 g 6   LORATADINE PO Take by mouth.     Multiple Vitamin (MULTIVITAMIN WITH MINERALS) TABS tablet Take 1 tablet by mouth daily.     naproxen (NAPROSYN) 500 MG tablet TAKE 1 TABLET BY MOUTH TWICE DAILY WITH A MEAL 30 tablet 2   omeprazole (PRILOSEC) 20 MG capsule Take 1 capsule (20 mg total) by mouth daily. 90 capsule 3   pravastatin (PRAVACHOL) 10 MG tablet Take 1  tablet (10 mg total) by mouth daily. 90 tablet 1   vitamin B-12 (CYANOCOBALAMIN) 100 MCG tablet Take 100 mcg by mouth every other day.     neomycin-polymyxin-hydrocortisone (CORTISPORIN) OTIC solution Place 3 drops into the right ear 3 (three) times daily. (Patient not taking: Reported on 04/08/2022) 10 mL 0   No facility-administered medications prior to visit.   Allergies  Allergen Reactions   Sulfa Antibiotics Other (See Comments)    bristers     Objective:   Today's Vitals   04/08/22 0852  BP: 132/80  Pulse: 78  Temp: (!) 97.4 F (36.3 C)  TempSrc: Temporal  SpO2: 98%  Weight: 141 lb 9.6 oz  (64.2 kg)  Height: 5' 3"$  (1.6 m)   Body mass index is 25.08 kg/m.   General: Well developed, well nourished. No acute distress. HEENT: Normocephalic, non-traumatic. Conjunctiva clear. External ears normal. EAC and TMs normal bilaterally.   [name] clear without congestion or rhinorrhea. Mucous membranes moist. Oropharynx clear. Good dentition. Neck: Supple. No lymphadenopathy. No thyromegaly. Lungs: Clear to auscultation bilaterally. No wheezing, rales or rhonchi. CV: RRR without murmurs or rubs. Pulses 2+ bilaterally. Psych: Alert and oriented. Normal mood and affect.  Health Maintenance Due  Topic Date Due   DTaP/Tdap/Td (1 - Tdap) Never done   Zoster Vaccines- Shingrix (1 of 2) Never done   HEMOGLOBIN A1C  04/22/2021     Assessment & Plan:   Problem List Items Addressed This Visit       Other   Hyperlipidemia    I will check lipids today. Continue pravastatin 10 mg daily.      Relevant Orders   Lipid panel   Prediabetes    I will reassess her A1c today.      Relevant Orders   Hemoglobin A1c   Glucose, random   Other Visit Diagnoses     Viral URI with cough    -  Primary   Currently symptoms are improved. I feel she is clear to return to work.       Return in about 6 months (around 10/07/2022) for Reassessment.   Haydee Salter, MD

## 2022-04-08 NOTE — Assessment & Plan Note (Signed)
I will reassess her A1c today.

## 2022-04-08 NOTE — Assessment & Plan Note (Signed)
I will check lipids today. Continue pravastatin 10 mg daily.

## 2022-04-13 ENCOUNTER — Telehealth: Payer: Self-pay

## 2022-04-13 DIAGNOSIS — E782 Mixed hyperlipidemia: Secondary | ICD-10-CM

## 2022-04-13 MED ORDER — PRAVASTATIN SODIUM 20 MG PO TABS: 10.0000 mg | ORAL_TABLET | Freq: Every day | ORAL | 3 refills | Status: DC

## 2022-04-13 NOTE — Telephone Encounter (Signed)
Patient called over the weekend and left a message with the after hours team that she is okay with increasing the pravastatin form 10 to 20 mg.  Please send to the pharmacy.  Thanks. Dm/cma

## 2022-04-13 NOTE — Addendum Note (Signed)
Addended by: Haydee Salter on: 04/13/2022 02:00 PM   Modules accepted: Orders

## 2022-04-14 ENCOUNTER — Telehealth: Payer: Self-pay | Admitting: Family Medicine

## 2022-04-14 DIAGNOSIS — E782 Mixed hyperlipidemia: Secondary | ICD-10-CM

## 2022-04-14 MED ORDER — PRAVASTATIN SODIUM 20 MG PO TABS: 20.0000 mg | ORAL_TABLET | Freq: Every day | ORAL | 3 refills | Status: DC

## 2022-04-14 NOTE — Telephone Encounter (Signed)
Spoke to patient and advise to take the whole tablet and we will send a new prescription with correct directions to the pharmacy. Dm/cma

## 2022-04-14 NOTE — Telephone Encounter (Signed)
Pt came into office with questions concerning her pravastatin (PRAVACHOL) 20 MG tablet [id number]. She was taking the 35m's, then she is suppose to bump up to the 230ms. Her script that she brought in is 2070m and she is suppose to split it in half. She doesn't understand why. Please advise pt at [phone]

## 2022-05-11 DIAGNOSIS — H2513 Age-related nuclear cataract, bilateral: Secondary | ICD-10-CM | POA: Diagnosis not present

## 2022-05-11 DIAGNOSIS — H35363 Drusen (degenerative) of macula, bilateral: Secondary | ICD-10-CM | POA: Diagnosis not present

## 2022-05-11 DIAGNOSIS — H524 Presbyopia: Secondary | ICD-10-CM | POA: Diagnosis not present

## 2022-07-27 ENCOUNTER — Ambulatory Visit (INDEPENDENT_AMBULATORY_CARE_PROVIDER_SITE_OTHER): Payer: Medicare Other

## 2022-07-27 VITALS — BP 122/70 | HR 65 | Temp 97.9°F | Ht 64.0 in | Wt 142.6 lb

## 2022-07-27 DIAGNOSIS — Z0000 Encounter for general adult medical examination without abnormal findings: Secondary | ICD-10-CM

## 2022-07-27 NOTE — Progress Notes (Signed)
Subjective:   Betty [name] is a 77 y.o. female who presents for Medicare Annual (Subsequent) preventive examination.  Review of Systems     Cardiac Risk Factors include: advanced age (>35men, >26 women)     Objective:    Today's Vitals   07/27/22 1252  BP: 122/70  Pulse: 65  Temp: 97.9 F (36.6 C)  TempSrc: Oral  SpO2: 96%  Weight: 142 lb 9.6 oz (64.7 kg)  Height: 5\' 4"  (1.626 m)   Body mass index is 24.48 kg/m.     07/27/2022    1:03 PM 09/23/2021    9:36 AM 08/12/2021    1:09 AM 07/29/2021   12:49 PM 07/05/2019    4:28 PM 07/05/2019    1:51 PM  Advanced Directives  Does Patient Have a Medical Advance Directive? Yes No No Yes No No  Type of Estate agent of [place];Living will   Healthcare Power of [place];Living will    Does patient want to make changes to medical advance directive?     No - Patient declined Yes (ED - Information included in AVS)  Copy of Healthcare Power of Attorney in Chart? No - copy requested   No - copy requested    Would patient like information on creating a medical advance directive?  No - Patient declined   No - Patient declined     Current Medications (verified) Outpatient Encounter Medications as of 07/27/2022  Medication Sig   Ascorbic Acid (VITAMIN C) 500 MG CAPS Take 500 capsules by mouth daily.   b complex vitamins capsule Take 1 capsule by mouth daily.   carboxymethylcellulose (REFRESH PLUS) 0.5 % SOLN 1 drop 3 (three) times daily as needed.   cholecalciferol (VITAMIN D3) 25 MCG (1000 UNIT) tablet Take 1,000 Units by mouth daily.   fluticasone (FLONASE) 50 MCG/ACT nasal spray Place 1 spray into both nostrils daily.   LORATADINE PO Take by mouth.   Multiple Vitamin (MULTIVITAMIN WITH MINERALS) TABS tablet Take 1 tablet by mouth daily.   omeprazole (PRILOSEC) 20 MG capsule Take 20 mg by mouth daily as needed.   pravastatin (PRAVACHOL) 20 MG tablet Take 1 tablet (20 mg total) by mouth daily.   vitamin B-12  (CYANOCOBALAMIN) 100 MCG tablet Take 100 mcg by mouth every other day.   aspirin 81 MG EC tablet Take 81 mg by mouth daily. Swallow whole. (Patient not taking: Reported on 07/27/2022)   naproxen (NAPROSYN) 500 MG tablet TAKE 1 TABLET BY MOUTH TWICE DAILY WITH A MEAL (Patient not taking: Reported on 07/27/2022)   No facility-administered encounter medications on file as of 07/27/2022.    Allergies (verified) Sulfa antibiotics   History: Past Medical History:  Diagnosis Date   High cholesterol    History of pulmonary embolus (PE) - Dx'd 05/2018  06/21/2018   Past Surgical History:  Procedure Laterality Date   CESAREAN SECTION     EYE SURGERY     LAPAROSCOPIC APPENDECTOMY N/A 07/06/2019   Procedure: APPENDECTOMY LAPAROSCOPIC, TAP BLOCK;  Surgeon: Karie Soda, MD;  Location: [facility];  Service: General;  Laterality: N/A;   Family History  Problem Relation Age of Onset   Hypertension Mother    Hypertension Father    Alzheimer's disease Father    Cancer Daughter        Breast   Cancer Paternal Aunt    Heart disease Paternal Grandmother    Social History   Socioeconomic History   Marital status: Divorced    Spouse  name: Not on file   Number of children: 5   Years of education: Not on file   Highest education level: Not on file  Occupational History   Occupation: retired    Associate Professor: [employer]  Tobacco Use   Smoking status: Never   Smokeless tobacco: Never  Vaping Use   Vaping Use: Never used  Substance and Sexual Activity   Alcohol use: Never   Drug use: Never   Sexual activity: Not Currently  Other Topics Concern   Not on file  Social History Narrative   Lived in [place], [place]   Relocated to [place] in Aug 2020   Social Determinants of Health   Financial Resource Strain: Low Risk  (07/27/2022)   Overall Financial Resource Strain (CARDIA)    Difficulty of Paying Living Expenses: Not hard at all  Food Insecurity: No Food Insecurity (07/27/2022)   Hunger Vital Sign    Worried  About Running Out of Food in the Last Year: Never true    Ran Out of Food in the Last Year: Never true  Transportation Needs: No Transportation Needs (07/27/2022)   PRAPARE - Administrator, Civil Service (Medical): No    Lack of Transportation (Non-Medical): No  Physical Activity: Insufficiently Active (07/27/2022)   Exercise Vital Sign    Days of Exercise per Week: 5 days    Minutes of Exercise per Session: 20 min  Stress: No Stress Concern Present (07/27/2022)   [employer] of Occupational Health - Occupational Stress Questionnaire    Feeling of Stress : Not at all  Social Connections: Socially Isolated (07/29/2021)   Social Connection and Isolation Panel [NHANES]    Frequency of Communication with Friends and Family: Three times a week    Frequency of Social Gatherings with Friends and Family: Three times a week    Attends Religious Services: Never    Active Member of Clubs or Organizations: No    Attends Banker Meetings: Never    Marital Status: Divorced    Tobacco Counseling Counseling given: Not Answered   Clinical Intake:  Pre-visit preparation completed: Yes  Pain : No/denies pain     Nutritional Status: BMI of 19-24  Normal Nutritional Risks: None (nausea comes and goes) Diabetes: No  How often do you need to have someone help you when you read instructions, pamphlets, or other written materials from your doctor or pharmacy?: 1 - Never  Diabetic? no  Interpreter Needed?: No  Information entered by :: NAllen LPN   Activities of Daily Living    07/27/2022    1:04 PM 07/29/2021   12:52 PM  In your present state of health, do you have any difficulty performing the following activities:  Hearing? 0 0  Vision? 0 0  Difficulty concentrating or making decisions? 0 0  Walking or climbing stairs? 0 0  Dressing or bathing? 0 0  Doing errands, shopping? 0 0  Preparing Food and eating ? N N  Using the Toilet? N N  In the past six months,  have you accidently leaked urine? N N  Do you have problems with loss of bowel control? N N  Managing your Medications? N N  Managing your Finances? N N  Housekeeping or managing your Housekeeping? N N    Patient Care Team: Loyola Mast, MD as PCP - General (Family Medicine)  Indicate any recent Medical Services you may have received from other than [facility] providers in the past year (date may be approximate).  Assessment:   This is a routine wellness examination for Betty [name].  Hearing/Vision screen Vision Screening - Comments:: Regular eye exams, [facility]  Dietary issues and exercise activities discussed: Current Exercise Habits: Home exercise routine, Type of exercise: walking, Time (Minutes): 20, Frequency (Times/Week): 5, Weekly Exercise (Minutes/Week): 100   Goals Addressed             This Visit's Progress    Patient Stated       07/27/2022, wants to gain weight       Depression Screen    07/27/2022    1:04 PM 11/17/2021    3:01 PM 07/29/2021   12:49 PM 07/29/2021   12:48 PM 06/23/2021    3:14 PM 04/04/2021   10:36 AM 10/23/2020   10:07 AM  PHQ 2/9 Scores  PHQ - 2 Score 0 0 0 0 0 0 0  PHQ- 9 Score  2         Fall Risk    07/27/2022    1:03 PM 04/08/2022    9:00 AM 07/29/2021   12:49 PM 06/23/2021    3:14 PM 05/19/2021    1:03 PM  Fall Risk   Falls in the past year? 0 0 0 0 0  Number falls in past yr: 0 0 0 0 1  Injury with Fall? 0 0 0    Risk for fall due to : Medication side effect    No Fall Risks  Follow up Falls prevention discussed;Education provided;Falls evaluation completed Falls evaluation completed Falls evaluation completed  Falls evaluation completed    FALL RISK PREVENTION PERTAINING TO THE HOME:  Any stairs in or around the home? No  If so, are there any without handrails? N/a Home free of loose throw rugs in walkways, pet beds, electrical cords, etc? Yes  Adequate lighting in your home to reduce risk of falls? Yes   ASSISTIVE DEVICES  UTILIZED TO PREVENT FALLS:  Life alert? No  Use of a cane, walker or w/c? No  Grab bars in the bathroom? No  Shower chair or bench in shower? No  Elevated toilet seat or a handicapped toilet? No   TIMED UP AND GO:  Was the test performed? Yes .  Length of time to ambulate 10 feet: 5 sec.   Gait steady and fast without use of assistive device  Cognitive Function:        07/27/2022    1:06 PM  6CIT Screen  What Year? 0 points  What month? 0 points  What time? 0 points  Count back from 20 0 points  Months in reverse 4 points  Repeat phrase 4 points  Total Score 8 points    Immunizations Immunization History  Administered Date(s) Administered   PFIZER Comirnaty(Gray Top)Covid-19 Tri-Sucrose Vaccine 04/15/2020   PFIZER(Purple Top)SARS-COV-2 Vaccination 09/23/2019, 10/24/2019   Pfizer Covid-19 Vaccine Bivalent Booster 106yrs & up 03/18/2022    TDAP status: Due, Education has been provided regarding the importance of this vaccine. Advised may receive this vaccine at local pharmacy or Health Dept. Aware to provide a copy of the vaccination record if obtained from local pharmacy or Health Dept. Verbalized acceptance and understanding.  Flu Vaccine status: Declined, Education has been provided regarding the importance of this vaccine but patient still declined. Advised may receive this vaccine at local pharmacy or Health Dept. Aware to provide a copy of the vaccination record if obtained from local pharmacy or Health Dept. Verbalized acceptance and understanding.  Pneumococcal vaccine status: Up  to date  Covid-19 vaccine status: Completed vaccines  Qualifies for Shingles Vaccine? Yes   Zostavax completed No   Shingrix Completed?: Yes  Screening Tests Health Maintenance  Topic Date Due   DTaP/Tdap/Td (1 - Tdap) Never done   Zoster Vaccines- Shingrix (1 of 2) Never done   Pneumonia Vaccine 77+ Years old (1 of 1 - PCV) Never done   Medicare Annual Wellness (AWV)  07/30/2022    Diabetic kidney evaluation - Urine ACR  04/09/2027 (Originally 10/23/2021)   INFLUENZA VACCINE  09/24/2022   HEMOGLOBIN A1C  10/07/2022   Diabetic kidney evaluation - eGFR measurement  10/23/2025   DEXA SCAN  Completed   Hepatitis C Screening  Completed   HPV VACCINES  Aged Out   FOOT EXAM  Discontinued   OPHTHALMOLOGY EXAM  Discontinued   COVID-19 Vaccine  Discontinued    Health Maintenance  Health Maintenance Due  Topic Date Due   DTaP/Tdap/Td (1 - Tdap) Never done   Zoster Vaccines- Shingrix (1 of 2) Never done   Pneumonia Vaccine 90+ Years old (1 of 1 - PCV) Never done   Medicare Annual Wellness (AWV)  07/30/2022    Colorectal cancer screening: No longer required.   Mammogram status: Completed 11/10/2021. Repeat every year  Bone Density status: Completed 02/06/2020.   Lung Cancer Screening: (Low Dose CT Chest recommended if Age 39-80 years, 30 pack-year currently smoking OR have quit w/in 15years.) does not qualify.   Lung Cancer Screening Referral: no  Additional Screening:  Hepatitis C Screening: does qualify; Completed 06/06/2020  Vision Screening: Recommended annual ophthalmology exams for early detection of glaucoma and other disorders of the eye. Is the patient up to date with their annual eye exam?  Yes  Who is the provider or what is the name of the office in which the patient attends annual eye exams? [facility] If pt is not established with a provider, would they like to be referred to a provider to establish care? No .   Dental Screening: Recommended annual dental exams for proper oral hygiene  Community Resource Referral / Chronic Care Management: CRR required this visit?  No   CCM required this visit?  No      Plan:     I have personally reviewed and noted the following in the patient's chart:   Medical and social history Use of alcohol, tobacco or illicit drugs  Current medications and supplements including opioid prescriptions.  Patient is not currently taking opioid prescriptions. Functional ability and status Nutritional status Physical activity Advanced directives List of other physicians Hospitalizations, surgeries, and ER visits in previous 12 months Vitals Screenings to include cognitive, depression, and falls Referrals and appointments  In addition, I have reviewed and discussed with patient certain preventive protocols, quality metrics, and best practice recommendations. A written personalized care plan for preventive services as well as general preventive health recommendations were provided to patient.     Barb Merino, LPN   02/28/1094   Nurse Notes: none

## 2022-07-27 NOTE — Patient Instructions (Signed)
Betty [name] , Thank you for taking time to come for your Medicare Wellness Visit. I appreciate your ongoing commitment to your health goals. Please review the following plan we discussed and let me know if I can assist you in the future.   These are the goals we discussed:  Goals      Patient Stated     07/27/2022, wants to gain weight        This is a list of the screening recommended for you and due dates:  Health Maintenance  Topic Date Due   DTaP/Tdap/Td vaccine (1 - Tdap) Never done   Zoster (Shingles) Vaccine (1 of 2) Never done   Pneumonia Vaccine (1 of 1 - PCV) Never done   Yearly kidney health urinalysis for diabetes  04/09/2027*   Flu Shot  09/24/2022   Hemoglobin A1C  10/07/2022   Medicare Annual Wellness Visit  07/27/2023   Yearly kidney function blood test for diabetes  10/23/2025   DEXA scan (bone density measurement)  Completed   Hepatitis C Screening  Completed   HPV Vaccine  Aged Out   Complete foot exam   Discontinued   Eye exam for diabetics  Discontinued   COVID-19 Vaccine  Discontinued  *Topic was postponed. The date shown is not the original due date.    Advanced directives: Please bring a copy of your POA (Power of Attorney) and/or Living Will to your next appointment.   Conditions/risks identified: none  Next appointment: Follow up in one year for your annual wellness visit    Preventive Care 65 Years and Older, Female Preventive care refers to lifestyle choices and visits with your health care provider that can promote health and wellness. What does preventive care include? A yearly physical exam. This is also called an annual well check. Dental exams once or twice a year. Routine eye exams. Ask your health care provider how often you should have your eyes checked. Personal lifestyle choices, including: Daily care of your teeth and gums. Regular physical activity. Eating a healthy diet. Avoiding tobacco and drug use. Limiting alcohol  use. Practicing safe sex. Taking low-dose aspirin every day. Taking vitamin and mineral supplements as recommended by your health care provider. What happens during an annual well check? The services and screenings done by your health care provider during your annual well check will depend on your age, overall health, lifestyle risk factors, and family history of disease. Counseling  Your health care provider may ask you questions about your: Alcohol use. Tobacco use. Drug use. Emotional well-being. Home and relationship well-being. Sexual activity. Eating habits. History of falls. Memory and ability to understand (cognition). Work and work Astronomer. Reproductive health. Screening  You may have the following tests or measurements: Height, weight, and BMI. Blood pressure. Lipid and cholesterol levels. These may be checked every 5 years, or more frequently if you are over 21 years old. Skin check. Lung cancer screening. You may have this screening every year starting at age 48 if you have a 30-pack-year history of smoking and currently smoke or have quit within the past 15 years. Fecal occult blood test (FOBT) of the stool. You may have this test every year starting at age 46. Flexible sigmoidoscopy or colonoscopy. You may have a sigmoidoscopy every 5 years or a colonoscopy every 10 years starting at age 95. Hepatitis C blood test. Hepatitis B blood test. Sexually transmitted disease (STD) testing. Diabetes screening. This is done by checking your blood sugar (glucose) after you  have not eaten for a while (fasting). You may have this done every 1-3 years. Bone density scan. This is done to screen for osteoporosis. You may have this done starting at age 23. Mammogram. This may be done every 1-2 years. Talk to your health care provider about how often you should have regular mammograms. Talk with your health care provider about your test results, treatment options, and if necessary,  the need for more tests. Vaccines  Your health care provider may recommend certain vaccines, such as: Influenza vaccine. This is recommended every year. Tetanus, diphtheria, and acellular pertussis (Tdap, Td) vaccine. You may need a Td booster every 10 years. Zoster vaccine. You may need this after age 21. Pneumococcal 13-valent conjugate (PCV13) vaccine. One dose is recommended after age 65. Pneumococcal polysaccharide (PPSV23) vaccine. One dose is recommended after age 82. Talk to your health care provider about which screenings and vaccines you need and how often you need them. This information is not intended to replace advice given to you by your health care provider. Make sure you discuss any questions you have with your health care provider. Document Released: 03/08/2015 Document Revised: 10/30/2015 Document Reviewed: 12/11/2014 Elsevier Interactive Patient Education  2017 [employer].  Fall Prevention in the Home Falls can cause injuries. They can happen to people of all ages. There are many things you can do to make your home safe and to help prevent falls. What can I do on the outside of my home? Regularly fix the edges of walkways and driveways and fix any cracks. Remove anything that might make you trip as you walk through a door, such as a raised step or threshold. Trim any bushes or trees on the path to your home. Use bright outdoor lighting. Clear any walking paths of anything that might make someone trip, such as rocks or tools. Regularly check to see if handrails are loose or broken. Make sure that both sides of any steps have handrails. Any raised decks and porches should have guardrails on the edges. Have any leaves, snow, or ice cleared regularly. Use sand or salt on walking paths during winter. Clean up any spills in your garage right away. This includes oil or grease spills. What can I do in the bathroom? Use night lights. Install grab bars by the toilet and in the  tub and shower. Do not use towel bars as grab bars. Use non-skid mats or decals in the tub or shower. If you need to sit down in the shower, use a plastic, non-slip stool. Keep the floor dry. Clean up any water that spills on the floor as soon as it happens. Remove soap buildup in the tub or shower regularly. Attach bath mats securely with double-sided non-slip rug tape. Do not have throw rugs and other things on the floor that can make you trip. What can I do in the bedroom? Use night lights. Make sure that you have a light by your bed that is easy to reach. Do not use any sheets or blankets that are too big for your bed. They should not hang down onto the floor. Have a firm chair that has side arms. You can use this for support while you get dressed. Do not have throw rugs and other things on the floor that can make you trip. What can I do in the kitchen? Clean up any spills right away. Avoid walking on wet floors. Keep items that you use a lot in easy-to-reach places. If you need  to reach something above you, use a strong step stool that has a grab bar. Keep electrical cords out of the way. Do not use floor polish or wax that makes floors slippery. If you must use wax, use non-skid floor wax. Do not have throw rugs and other things on the floor that can make you trip. What can I do with my stairs? Do not leave any items on the stairs. Make sure that there are handrails on both sides of the stairs and use them. Fix handrails that are broken or loose. Make sure that handrails are as [place] as the stairways. Check any carpeting to make sure that it is firmly attached to the stairs. Fix any carpet that is loose or worn. Avoid having throw rugs at the top or bottom of the stairs. If you do have throw rugs, attach them to the floor with carpet tape. Make sure that you have a light switch at the top of the stairs and the bottom of the stairs. If you do not have them, ask someone to add them for  you. What else can I do to help prevent falls? Wear shoes that: Do not have high heels. Have rubber bottoms. Are comfortable and fit you well. Are closed at the toe. Do not wear sandals. If you use a stepladder: Make sure that it is fully opened. Do not climb a closed stepladder. Make sure that both sides of the stepladder are locked into place. Ask someone to hold it for you, if possible. Clearly mark and make sure that you can see: Any grab bars or handrails. First and last steps. Where the edge of each step is. Use tools that help you move around (mobility aids) if they are needed. These include: Canes. Walkers. Scooters. Crutches. Turn on the lights when you go into a dark area. Replace any light bulbs as soon as they burn out. Set up your furniture so you have a clear path. Avoid moving your furniture around. If any of your floors are uneven, fix them. If there are any pets around you, be aware of where they are. Review your medicines with your doctor. Some medicines can make you feel dizzy. This can increase your chance of falling. Ask your doctor what other things that you can do to help prevent falls. This information is not intended to replace advice given to you by your health care provider. Make sure you discuss any questions you have with your health care provider. Document Released: 12/06/2008 Document Revised: 07/18/2015 Document Reviewed: 03/16/2014 Elsevier Interactive Patient Education  2017 [employer].

## 2022-08-25 ENCOUNTER — Encounter: Payer: Self-pay | Admitting: Family Medicine

## 2022-08-25 ENCOUNTER — Ambulatory Visit (INDEPENDENT_AMBULATORY_CARE_PROVIDER_SITE_OTHER): Payer: Medicare Other | Admitting: Family Medicine

## 2022-08-25 VITALS — BP 120/70 | HR 68 | Temp 98.3°F | Ht 64.0 in | Wt 140.0 lb

## 2022-08-25 DIAGNOSIS — N3000 Acute cystitis without hematuria: Secondary | ICD-10-CM | POA: Diagnosis not present

## 2022-08-25 LAB — POCT URINALYSIS DIPSTICK
Bilirubin, UA: NEGATIVE
Blood, UA: 10
Glucose, UA: NEGATIVE
Ketones, UA: NEGATIVE
Nitrite, UA: NEGATIVE
Protein, UA: NEGATIVE
Spec Grav, UA: 1.01 (ref 1.010–1.025)
Urobilinogen, UA: NEGATIVE E.U./dL — AB
pH, UA: 6 (ref 5.0–8.0)

## 2022-08-25 MED ORDER — NITROFURANTOIN 50 MG/5ML PO SUSP: 5.0000 mL | Freq: Four times a day (QID) | ORAL | 0 refills | Status: DC

## 2022-08-25 MED ORDER — NITROFURANTOIN MONOHYD MACRO 100 MG PO CAPS: 100.0000 mg | ORAL_CAPSULE | Freq: Two times a day (BID) | ORAL | 0 refills | Status: DC

## 2022-08-25 MED ORDER — PHENAZOPYRIDINE HCL 100 MG PO TABS: 100.0000 mg | ORAL_TABLET | Freq: Three times a day (TID) | ORAL | 0 refills | Status: DC | PRN

## 2022-08-25 NOTE — Addendum Note (Signed)
Addended by: Loyola Mast on: 08/25/2022 04:53 PM   Modules accepted: Orders

## 2022-08-25 NOTE — Progress Notes (Signed)
[facility] [address] [place] [address] Dept: [phone] Dept Fax: [phone]  Office Visit  Subjective:    Patient ID: Betty [name], female    DOB: 01-25-1946, 77 y.o..   MRN: [id number]  Chief Complaint  Patient presents with   Dysuria    C/o having urine frequency/pain x 3  days.     History of Present Illness:  Patient is in today with a 2-day history of dysuria, urgency, and frequency with only small urine volumes. She has had a UTI in the past, but it has been years. She reports difficulty swallowing some pills, so prefers a liquid for the antibiotic.  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   Gastroesophageal reflux disease 10/21/2018   Hyperlipidemia 10/21/2018   History of pulmonary embolus (PE) - Dx'd 05/2018  06/21/2018   Past Surgical History:  Procedure Laterality Date   CESAREAN SECTION     EYE SURGERY     LAPAROSCOPIC APPENDECTOMY N/A 07/06/2019   Procedure: APPENDECTOMY LAPAROSCOPIC, TAP BLOCK;  Surgeon: Karie Soda, MD;  Location: [facility];  Service: General;  Laterality: N/A;   Family History  Problem Relation Age of Onset   Hypertension Mother    Hypertension Father    Alzheimer's disease Father    Cancer Daughter        Breast   Cancer Paternal Aunt    Heart disease Paternal Grandmother    Outpatient Medications Prior to Visit  Medication Sig Dispense Refill   Ascorbic Acid (VITAMIN C) 500 MG  CAPS Take 500 capsules by mouth daily.     aspirin 81 MG EC tablet Take 81 mg by mouth daily. Swallow whole.     b complex vitamins capsule Take 1 capsule by mouth daily.     carboxymethylcellulose (REFRESH PLUS) 0.5 % SOLN 1 drop 3 (three) times daily as needed.     cholecalciferol (VITAMIN D3) 25 MCG (1000 UNIT) tablet Take 1,000 Units by mouth daily.     fluticasone (FLONASE) 50 MCG/ACT nasal spray Place 1 spray into both nostrils daily. 16 g 6   LORATADINE PO Take by mouth.     Multiple Vitamin (MULTIVITAMIN WITH MINERALS) TABS tablet Take 1 tablet by mouth daily.     naproxen (NAPROSYN) 500 MG tablet TAKE 1 TABLET BY MOUTH TWICE DAILY WITH A MEAL 30 tablet 2   omeprazole (PRILOSEC) 20 MG capsule Take 20 mg by mouth daily as needed.     pravastatin (PRAVACHOL) 20 MG tablet Take 1 tablet (20 mg total) by mouth daily. 90 tablet 3   vitamin B-12 (CYANOCOBALAMIN) 100 MCG tablet Take 100 mcg by mouth every other day.     No facility-administered medications prior to visit.   Allergies  Allergen Reactions   Sulfa Antibiotics Other (See Comments)    bristers     Objective:   Today's Vitals   08/25/22 1528  BP: 120/70  Pulse: 68  Temp: 98.3 F (36.8 C)  TempSrc: Temporal  Weight: 140 lb (63.5 kg)  Height: 5\' 4"  (1.626 m)   Body mass index is 24.03 kg/m.   General: Well developed, well nourished. No acute distress. Psych: Alert and oriented. Normal mood and affect.  Health Maintenance Due  Topic Date Due   DTaP/Tdap/Td (1 - Tdap) Never done   Zoster Vaccines- Shingrix (1 of 2) Never done   Pneumonia Vaccine 63+ Years old (1 of 1 - PCV) Never done   Lab Results Component Ref Range & Units 15:45 (08/25/22)  Color, UA yellow  Clarity, UA clear  Glucose, UA Negative Negative  Bilirubin, UA neg  Ketones, UA neg  Spec Grav, UA 1.010 - 1.025 1.010  Blood, UA 10 ery/ul  pH, UA 5.0 - 8.0 6.0  Protein, UA Negative Negative  Urobilinogen, UA 0.2 or 1.0 E.U./dL negative  Abnormal   Nitrite, UA neg  Leukocytes, UA Negative Moderate (2+) Abnormal       Assessment & Plan:   Problem List Items Addressed This Visit   None Visit Diagnoses     Acute cystitis without hematuria    -  Primary   I will treat her with nitrofurantoin susp. I will add Pyridium for comfort. Recommend she push fluids.   Relevant Medications   Nitrofurantoin 50 MG/5ML SUSP   phenazopyridine (PYRIDIUM) 100 MG tablet   Other Relevant Orders   POCT Urinalysis Dipstick (Completed)   Urine Culture       Return if symptoms worsen or fail to improve.   Loyola Mast, MD

## 2022-08-27 LAB — URINE CULTURE
MICRO NUMBER:: 15153170
Result:: NO GROWTH
SPECIMEN QUALITY:: ADEQUATE

## 2022-10-30 ENCOUNTER — Other Ambulatory Visit (INDEPENDENT_AMBULATORY_CARE_PROVIDER_SITE_OTHER): Payer: Medicare Other

## 2022-10-30 ENCOUNTER — Ambulatory Visit: Payer: Medicare Other | Admitting: Orthopaedic Surgery

## 2022-10-30 ENCOUNTER — Encounter: Payer: Self-pay | Admitting: Orthopaedic Surgery

## 2022-10-30 DIAGNOSIS — M25561 Pain in right knee: Secondary | ICD-10-CM | POA: Diagnosis not present

## 2022-10-30 DIAGNOSIS — M25562 Pain in left knee: Secondary | ICD-10-CM

## 2022-10-30 DIAGNOSIS — G8929 Other chronic pain: Secondary | ICD-10-CM

## 2022-10-30 MED ORDER — BUPIVACAINE HCL 0.5 % IJ SOLN
2.0000 mL | INTRAMUSCULAR | Status: AC | PRN
  Administered 2022-10-30: 2 mL via INTRA_ARTICULAR

## 2022-10-30 MED ORDER — LIDOCAINE HCL 1 % IJ SOLN
2.0000 mL | INTRAMUSCULAR | Status: AC | PRN
  Administered 2022-10-30: 2 mL

## 2022-10-30 MED ORDER — METHYLPREDNISOLONE ACETATE 40 MG/ML IJ SUSP
40.0000 mg | INTRAMUSCULAR | Status: AC | PRN
  Administered 2022-10-30: 40 mg via INTRA_ARTICULAR

## 2022-10-30 NOTE — Progress Notes (Signed)
Office Visit Note   Patient: Betty [name]           Date of Birth: 08-29-1945           MRN: [id number] Visit Date: 10/30/2022              Requested by: Loyola Mast, MD [address] [place],  [place] [address] PCP: Loyola Mast, MD   Assessment & Plan: Visit Diagnoses:  1. Chronic pain of both knees     Plan: [name] is a 77 year old female with bilateral knee pain impression is probable osteoarthritis flare.  Both knees were injected with cortisone today.  I have provided home exercise program for her.  Continue with over-the-counter medications and conservative management.  Follow-up as needed.  Follow-Up Instructions: No follow-ups on file.   Orders:  Orders Placed This Encounter  Procedures   Large Joint Inj   XR KNEE 3 VIEW LEFT   XR KNEE 3 VIEW RIGHT   No orders of the defined types were placed in this encounter.     Procedures: Large Joint Inj: bilateral knee on 10/30/2022 10:01 AM Indications: pain Details: 22 G needle  Arthrogram: No  Medications (Right): 2 mL lidocaine 1 %; 2 mL bupivacaine 0.5 %; 40 mg methylPREDNISolone acetate 40 MG/ML Medications (Left): 2 mL lidocaine 1 %; 2 mL bupivacaine 0.5 %; 40 mg methylPREDNISolone acetate 40 MG/ML Outcome: tolerated well, no immediate complications Patient was prepped and draped in the usual sterile fashion.       Clinical Data: No additional findings.   Subjective: Chief Complaint  Patient presents with   Right Knee - Pain    HPI Betty [name] is a 77 year old female who comes in today for bilateral knee pain.  Feels like the knee is slipping out.  She has more pain in the morning and night.  She has been treating with over-the-counter medications and heat and ice and activity modifications. Review of Systems  Constitutional: Negative.   HENT: Negative.    Eyes: Negative.   Respiratory: Negative.    Cardiovascular: Negative.   Endocrine: Negative.   Musculoskeletal: Negative.    Neurological: Negative.   Hematological: Negative.   Psychiatric/Behavioral: Negative.    All other systems reviewed and are negative.    Objective: Vital Signs: There were no vitals taken for this visit.  Physical Exam Vitals and nursing note reviewed.  Constitutional:      Appearance: She is well-developed.  HENT:     Head: Atraumatic.     Nose: Nose normal.  Eyes:     Extraocular Movements: Extraocular movements intact.  Cardiovascular:     Pulses: Normal pulses.  Pulmonary:     Effort: Pulmonary effort is normal.  Abdominal:     Palpations: Abdomen is soft.  Musculoskeletal:     Cervical back: Neck supple.  Skin:    General: Skin is warm.     Capillary Refill: Capillary refill takes less than 2 seconds.  Neurological:     Mental Status: She is alert. Mental status is at baseline.  Psychiatric:        Behavior: Behavior normal.        Thought Content: Thought content normal.        Judgment: Judgment normal.     Ortho Exam Examination bilateral knees show no joint effusion.  There is 2+ patellofemoral crepitus with range of motion.  No joint line tenderness.  Collaterals and cruciates are stable. Specialty Comments:  No specialty comments  available.  Imaging: XR KNEE 3 VIEW RIGHT  Result Date: 10/30/2022 X-rays demonstrate mild osteoarthritis with periarticular spurring.  Preserved joint spaces.  XR KNEE 3 VIEW LEFT  Result Date: 10/30/2022 X-rays demonstrate mild osteoarthritis with periarticular spurring.  Preserved joint spaces.    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   Gastroesophageal reflux disease 10/21/2018   Hyperlipidemia 10/21/2018   History of pulmonary embolus (PE) - Dx'd 05/2018  06/21/2018   Past Medical History:  Diagnosis Date   High cholesterol    History of pulmonary embolus (PE) - Dx'd 05/2018  06/21/2018    Family History  Problem Relation Age of Onset   Hypertension Mother    Hypertension Father    Alzheimer's disease Father    Cancer Daughter        Breast   Cancer Paternal Aunt    Heart disease Paternal Grandmother     Past Surgical History:  Procedure Laterality Date   CESAREAN SECTION     EYE SURGERY     LAPAROSCOPIC APPENDECTOMY N/A 07/06/2019   Procedure: APPENDECTOMY LAPAROSCOPIC, TAP BLOCK;  Surgeon: Karie Soda, MD;  Location: [facility];  Service: General;  Laterality: N/A;   Social History   Occupational History   Occupation: retired    Associate Professor: [employer]  Tobacco Use   Smoking status: Never   Smokeless tobacco: Never  Vaping Use   Vaping status: Never Used  Substance and Sexual Activity   Alcohol use: Never   Drug use: Never   Sexual activity: Not Currently

## 2022-11-16 DIAGNOSIS — Z1231 Encounter for screening mammogram for malignant neoplasm of breast: Secondary | ICD-10-CM | POA: Diagnosis not present

## 2022-11-16 LAB — HM MAMMOGRAPHY

## 2022-11-17 ENCOUNTER — Ambulatory Visit (INDEPENDENT_AMBULATORY_CARE_PROVIDER_SITE_OTHER): Payer: Medicare Other | Admitting: Family Medicine

## 2022-11-17 ENCOUNTER — Encounter: Payer: Self-pay | Admitting: Family Medicine

## 2022-11-17 VITALS — BP 124/70 | HR 76 | Temp 98.1°F | Ht 64.0 in | Wt 138.6 lb

## 2022-11-17 DIAGNOSIS — R252 Cramp and spasm: Secondary | ICD-10-CM | POA: Diagnosis not present

## 2022-11-17 DIAGNOSIS — R1013 Epigastric pain: Secondary | ICD-10-CM | POA: Diagnosis not present

## 2022-11-17 LAB — COMPREHENSIVE METABOLIC PANEL
ALT: 23 U/L (ref 0–35)
AST: 24 U/L (ref 0–37)
Albumin: 4.4 g/dL (ref 3.5–5.2)
Alkaline Phosphatase: 52 U/L (ref 39–117)
BUN: 15 mg/dL (ref 6–23)
CO2: 30 mEq/L (ref 19–32)
Calcium: 9.7 mg/dL (ref 8.4–10.5)
Chloride: 101 mEq/L (ref 96–112)
Creatinine, Ser: 0.78 mg/dL (ref 0.40–1.20)
GFR: 72.49 mL/min (ref >60.00)
Glucose, Bld: 96 mg/dL (ref 70–99)
Potassium: 4.1 mEq/L (ref 3.5–5.1)
Sodium: 139 mEq/L (ref 135–145)
Total Bilirubin: 0.9 mg/dL (ref 0.2–1.2)
Total Protein: 7.1 g/dL (ref 6.0–8.3)

## 2022-11-17 LAB — MAGNESIUM: Magnesium: 2.1 mg/dL (ref 1.5–2.5)

## 2022-11-17 NOTE — Progress Notes (Signed)
[facility] [address] [place] [address] Dept: [phone] Dept Fax: [phone]  Office Visit  Subjective:    Patient ID: Betty [name], female    DOB: 1945/08/29, 77 y.o..   MRN: [id number]  Chief Complaint  Patient presents with   Leg Pain    C/o having cramps in both legs x 3 week.   Also having acid reflex   History of Present Illness:  Patient is in today complaining of cramping in her legs over the past 3 weeks. She does notes this developed after she was given bilateral steroid injections in her knees. She has had the cramping at night. She will see a knotted up area in the muscle. She has been doing massage, stretches, and using ice to try and reduce this.   [name] also has a history of acid reflux. She is concerned that this is getting worse. She describes some pain towards the LUQ. She admits to some gassiness as well. She is not necessarily having heartburn or chest symptoms.  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   Gastroesophageal reflux disease 10/21/2018   Hyperlipidemia 10/21/2018   History of pulmonary embolus (PE) - Dx'd 05/2018  06/21/2018   Past Surgical History:  Procedure Laterality Date   CESAREAN SECTION     EYE SURGERY     LAPAROSCOPIC APPENDECTOMY N/A 07/06/2019   Procedure: APPENDECTOMY LAPAROSCOPIC, TAP BLOCK;  Surgeon: Karie Soda, MD;  Location: [facility];  Service: General;  Laterality: N/A;    Family History  Problem Relation Age of Onset   Hypertension Mother    Hypertension Father    Alzheimer's disease Father    Cancer Daughter        Breast   Cancer Paternal Aunt    Heart disease Paternal Grandmother    Outpatient Medications Prior to Visit  Medication Sig Dispense Refill   Ascorbic Acid (VITAMIN C) 500 MG CAPS Take 500 capsules by mouth daily.     aspirin 81 MG EC tablet Take 81 mg by mouth daily. Swallow whole.     b complex vitamins capsule Take 1 capsule by mouth daily.     carboxymethylcellulose (REFRESH PLUS) 0.5 % SOLN 1 drop 3 (three) times daily as needed.     cholecalciferol (VITAMIN D3) 25 MCG (1000 UNIT) tablet Take 1,000 Units by mouth daily.     fluticasone (FLONASE) 50 MCG/ACT nasal spray Place 1 spray into both nostrils daily. 16 g 6   LORATADINE PO Take by mouth.     Multiple Vitamin (MULTIVITAMIN WITH MINERALS) TABS tablet Take 1 tablet by mouth daily.     omeprazole (PRILOSEC) 20 MG capsule Take 20 mg by mouth daily as needed.     pravastatin (PRAVACHOL) 20 MG tablet Take 1 tablet (20 mg total) by mouth daily. 90 tablet 3   vitamin B-12 (CYANOCOBALAMIN) 100 MCG tablet Take 100 mcg by mouth every other day.     vitamin k 100 MCG tablet Take 100 mcg by mouth daily.  naproxen (NAPROSYN) 500 MG tablet TAKE 1 TABLET BY MOUTH TWICE DAILY WITH A MEAL 30 tablet 2   nitrofurantoin, macrocrystal-monohydrate, (MACROBID) 100 MG capsule Take 1 capsule (100 mg total) by mouth 2 (two) times daily. 6 capsule 0   phenazopyridine (PYRIDIUM) 100 MG tablet Take 1 tablet (100 mg total) by mouth 3 (three) times daily as needed for pain. 6 tablet 0   No facility-administered medications prior to visit.   Allergies  Allergen Reactions   Sulfa Antibiotics Other (See Comments)    bristers     Objective:   Today's Vitals   11/17/22 1108  BP: 124/70  Pulse: 76  Temp: 98.1 F (36.7 C)  TempSrc: Temporal  Weight: 138 lb 9.6 oz (62.9 kg)  Height: 5\' 4"  (1.626 m)    Body mass index is 23.79 kg/m.   General: Well developed, well nourished. No acute distress. Extremities: Full ROM. No swelling of the lower legs. Muscles are soft and nontender. Psych: Alert and oriented. Normal mood and affect.  Health Maintenance Due  Topic Date Due   DTaP/Tdap/Td (1 - Tdap) Never done   Zoster Vaccines- Shingrix (1 of 2) Never done   Pneumonia Vaccine 47+ Years old (1 of 1 - PCV) Never done   HEMOGLOBIN A1C  10/07/2022     Assessment & Plan:   Problem List Items Addressed This Visit   None Visit Diagnoses     Bilateral leg cramps    -  Primary   I will check screenign labs for underlying electrolyte or mineral issue. She should continue stretches, massage, and ice. Recommend tonic water at bedtime.   Relevant Orders   Comprehensive metabolic panel   Magnesium   Dyspepsia       Sounds more gas-like. I recommend she use Gas-x for symptomatic management.       Return if symptoms worsen or fail to improve.   Loyola Mast, MD

## 2022-11-17 NOTE — Patient Instructions (Signed)
Continue calf stretches and massage. Use ice as needed. Use topical rubs as needed. Consider taking ~ 6 oz. of tonic water at bedtime.

## 2023-04-20 ENCOUNTER — Ambulatory Visit: Payer: Medicare Other | Admitting: Internal Medicine

## 2023-04-20 NOTE — Progress Notes (Deleted)
 [facility] [address] [place] [address] Dept: [phone] Dept Fax: [phone]  Acute Care Office Visit  Subjective:   Betty [name] [birth year] 04/20/2023  No chief complaint on file.   HPI: Discussed the use of AI scribe software for clinical note transcription with the patient, who gave verbal consent to proceed.  History of Present Illness             The following portions of the patient's history were reviewed and updated as appropriate: past medical history, past surgical history, family history, social history, allergies, medications, and problem list.   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   Gastroesophageal reflux disease 10/21/2018   Hyperlipidemia 10/21/2018   History of pulmonary embolus (PE) - Dx'd 05/2018  06/21/2018   Past Medical History:  Diagnosis Date   High cholesterol    History of pulmonary embolus (PE) - Dx'd 05/2018  06/21/2018   Past Surgical History:  Procedure Laterality Date   CESAREAN SECTION     EYE SURGERY     LAPAROSCOPIC APPENDECTOMY N/A 07/06/2019   Procedure: APPENDECTOMY LAPAROSCOPIC, TAP BLOCK;  Surgeon: Karie Soda, MD;  Location: [facility];  Service: General;  Laterality: N/A;   Family History  Problem Relation Age of Onset   Hypertension Mother    Hypertension Father    Alzheimer's disease Father    Cancer Daughter        Breast   Cancer Paternal Aunt    Heart disease Paternal Grandmother      Current Outpatient Medications:    Ascorbic Acid (VITAMIN C) 500 MG CAPS, Take 500 capsules by mouth daily., Disp: , Rfl:    aspirin 81 MG EC tablet, Take 81 mg by mouth daily. Swallow whole., Disp: , Rfl:    b complex vitamins capsule, Take 1 capsule by mouth daily., Disp: , Rfl:    carboxymethylcellulose (REFRESH PLUS) 0.5 % SOLN, 1 drop 3 (three) times daily as needed., Disp: , Rfl:    cholecalciferol (VITAMIN D3) 25 MCG (1000 UNIT) tablet, Take 1,000 Units by mouth daily., Disp: , Rfl:    fluticasone (FLONASE) 50 MCG/ACT nasal spray, Place 1 spray into both nostrils daily., Disp: 16 g, Rfl: 6   LORATADINE PO, Take by mouth., Disp: , Rfl:    Multiple Vitamin (MULTIVITAMIN WITH MINERALS) TABS tablet, Take 1 tablet by mouth daily., Disp: , Rfl:    omeprazole (PRILOSEC) 20 MG capsule, Take 20 mg by mouth daily as needed., Disp: , Rfl:    pravastatin (PRAVACHOL) 20 MG tablet, Take 1 tablet (20 mg total) by mouth daily., Disp: 90 tablet, Rfl: 3   vitamin B-12 (CYANOCOBALAMIN) 100 MCG tablet, Take 100 mcg by mouth every other day., Disp: , Rfl:    vitamin k 100 MCG tablet, Take 100 mcg by mouth daily., Disp: , Rfl:  Allergies  Allergen Reactions   Sulfa Antibiotics Other (See Comments)    bristers     ROS: A complete ROS was performed with pertinent positives/negatives noted in the HPI. The remainder  of the ROS are negative.    Objective:   There were no vitals filed for this visit.  GENERAL: Well-appearing, in NAD. Well nourished.  SKIN: Pink, warm and dry. No rash.  HEENT:    HEAD: Normocephalic, non-traumatic.  EYES: Conjunctive pink without exudate. PERRL.  EARS: External ear w/o redness, swelling, masses, or lesions. EAC clear. TM's intact, translucent w/o bulging, appropriate landmarks visualized.  NOSE: Septum midline w/o deformity. Nares patent, mucosa pink and non-inflamed w/o drainage. No sinus tenderness.  THROAT: Uvula midline. Oropharynx clear. Tonsils non-inflamed  w/o exudate ***. Mucus membranes pink and moist.  NECK: Trachea midline. Full ROM w/o pain or tenderness. No lymphadenopathy.  RESPIRATORY: Chest wall symmetrical. Respirations even and non-labored. Breath sounds clear to auscultation bilaterally.  CARDIAC: S1, S2 present, regular rate and rhythm. Peripheral pulses 2+ bilaterally.  EXTREMITIES: Without clubbing, cyanosis, or edema.  NEUROLOGIC: Steady, even gait.  PSYCH/MENTAL STATUS: Alert, oriented x 3. Cooperative, appropriate mood and affect.    No results found for any visits on 04/20/23.    Assessment & Plan:  Assessment and Plan               There are no diagnoses linked to this encounter. No orders of the defined types were placed in this encounter.  No orders of the defined types were placed in this encounter.  Lab Orders  No laboratory test(s) ordered today   No images are attached to the encounter or orders placed in the encounter.  No follow-ups on file.   Salvatore Decent, FNP

## 2023-05-04 ENCOUNTER — Other Ambulatory Visit: Payer: Self-pay

## 2023-05-04 DIAGNOSIS — E782 Mixed hyperlipidemia: Secondary | ICD-10-CM

## 2023-05-04 MED ORDER — PRAVASTATIN SODIUM 20 MG PO TABS: 20.0000 mg | ORAL_TABLET | Freq: Every day | ORAL | 1 refills | Status: DC

## 2023-05-06 DIAGNOSIS — H524 Presbyopia: Secondary | ICD-10-CM | POA: Diagnosis not present

## 2023-05-06 DIAGNOSIS — H5203 Hypermetropia, bilateral: Secondary | ICD-10-CM | POA: Diagnosis not present

## 2023-05-06 DIAGNOSIS — H04123 Dry eye syndrome of bilateral lacrimal glands: Secondary | ICD-10-CM | POA: Diagnosis not present

## 2023-05-06 DIAGNOSIS — H35363 Drusen (degenerative) of macula, bilateral: Secondary | ICD-10-CM | POA: Diagnosis not present

## 2023-05-06 DIAGNOSIS — H52223 Regular astigmatism, bilateral: Secondary | ICD-10-CM | POA: Diagnosis not present

## 2023-05-07 DIAGNOSIS — H524 Presbyopia: Secondary | ICD-10-CM | POA: Diagnosis not present

## 2023-05-18 ENCOUNTER — Encounter: Payer: Self-pay | Admitting: Family Medicine

## 2023-05-18 ENCOUNTER — Ambulatory Visit (INDEPENDENT_AMBULATORY_CARE_PROVIDER_SITE_OTHER): Admitting: Family Medicine

## 2023-05-18 VITALS — BP 122/74 | HR 72 | Temp 97.6°F | Ht 64.0 in | Wt 141.2 lb

## 2023-05-18 DIAGNOSIS — M5432 Sciatica, left side: Secondary | ICD-10-CM | POA: Insufficient documentation

## 2023-05-18 MED ORDER — NAPROXEN 500 MG PO TABS: 500.0000 mg | ORAL_TABLET | Freq: Two times a day (BID) | ORAL | 0 refills | Status: DC

## 2023-05-18 NOTE — Patient Instructions (Signed)
 Take naproxen 500 mg twice a day for 7 days.  If symptoms have not resolve, continue naproxen for another 7 days.  If symptoms persist past this, contact Dr. Veto Kemps via MyChart to discuss a physical therapy referral.

## 2023-05-18 NOTE — Assessment & Plan Note (Addendum)
 Symptoms likely reflect a muscle strain in the left obliques, but also some left-sided sciatica. I recommend ongoing stretches and use of heat. I will add a course of naproxen 500 mg bid for 7 days. If the issues is not fully resolved, she should continue a 2nd week of naproxen. If still not fully resolved, she should reach out to me regarding a PT referral.

## 2023-05-18 NOTE — Progress Notes (Signed)
 [facility] [address] [place] [address] Dept: [phone] Dept Fax: [phone]  Office Visit  Subjective:    Patient ID: Betty [name], female    DOB: 1945-09-15, 78 y.o..   MRN: [id number]  Chief Complaint  Patient presents with   Back Pain    C/o having pain in lower back x 2 weeks.  Has been taking Aleve.     History of Present Illness:  Patient is in today for complaining of a two week history of left flank pain. This started after an episode where she reached up to pull tape off a wall and had acute onset of pain. She notes that associated with this, when began having pain with walking or with getting up to stand, radiating down her left buttocks and posterior left leg. She does regular leg stretches, which have not seemed to bother her leg. She has been using heat and ice and had tried OTC naproxen.  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   Gastroesophageal reflux disease 10/21/2018   Hyperlipidemia 10/21/2018   History of pulmonary embolus (PE) - Dx'd 05/2018  06/21/2018   Past Surgical History:  Procedure Laterality Date   CESAREAN SECTION     EYE SURGERY     LAPAROSCOPIC APPENDECTOMY N/A 07/06/2019   Procedure: APPENDECTOMY LAPAROSCOPIC, TAP BLOCK;  Surgeon: Karie Soda, MD;  Location: [facility];  Service: General;  Laterality: N/A;   Family History  Problem Relation Age of Onset   Hypertension Mother    Hypertension  Father    Alzheimer's disease Father    Cancer Daughter        Breast   Cancer Paternal Aunt    Heart disease Paternal Grandmother    Outpatient Medications Prior to Visit  Medication Sig Dispense Refill   Ascorbic Acid (VITAMIN C) 500 MG CAPS Take 500 capsules by mouth daily.     b complex vitamins capsule Take 1 capsule by mouth daily.     carboxymethylcellulose (REFRESH PLUS) 0.5 % SOLN 1 drop 3 (three) times daily as needed.     cholecalciferol (VITAMIN D3) 25 MCG (1000 UNIT) tablet Take 1,000 Units by mouth daily.     fluticasone (FLONASE) 50 MCG/ACT nasal spray Place 1 spray into both nostrils daily. 16 g 6   LORATADINE PO Take by mouth.     Multiple Vitamin (MULTIVITAMIN WITH MINERALS) TABS tablet Take 1 tablet by mouth daily.     omeprazole (PRILOSEC) 20 MG capsule Take 20 mg by mouth daily as needed.     pravastatin (PRAVACHOL) 20 MG tablet Take 1 tablet (20 mg total) by mouth daily. 90 tablet 1   vitamin B-12 (CYANOCOBALAMIN) 100 MCG tablet Take 100 mcg by mouth every other day.     vitamin k 100 MCG tablet Take 100 mcg by mouth daily.     aspirin 81 MG EC tablet Take 81 mg by mouth daily. Swallow whole. (Patient not taking: Reported on 05/18/2023)     No facility-administered medications prior to visit.  Allergies  Allergen Reactions   Sulfa Antibiotics Other (See Comments)    bristers     Objective:   Today's Vitals   05/18/23 1438  BP: 122/74  Pulse: 72  Temp: 97.6 F (36.4 C)  TempSrc: Temporal  Weight: 141 lb 3.2 oz (64 kg)  Height: 5\' 4"  (1.626 m)   Body mass index is 24.24 kg/m.   General: Well developed, well nourished. No acute distress. Back: Straight. Pain indicated over the left oblique muscles. Extremities: Full ROM. No joint swelling or tenderness. Psych: Alert and oriented. Normal mood and affect.  Health Maintenance Due  Topic Date Due   Pneumonia Vaccine 46+ Years old (1 of 2 - PCV) Never done   DTaP/Tdap/Td (1 - Tdap) Never done    Zoster Vaccines- Shingrix (1 of 2) Never done   HEMOGLOBIN A1C  10/07/2022     Assessment & Plan:   Problem List Items Addressed This Visit       Nervous and Auditory   Sciatica of left side - Primary   Symptoms likely reflect a muscle strain in the left obliques, but also some left-sided sciatica. I recommend ongoing stretches and use of heat. I will add a course of naproxen 500 mg bid for 7 days. If the issues is not fully resolved, she should continue a 2nd week of naproxen. If still not fully resolved, she should reach out to me regarding a PT referral.      Relevant Medications   naproxen (NAPROSYN) 500 MG tablet    Return if symptoms worsen or fail to improve.   Loyola Mast, MD

## 2023-06-07 ENCOUNTER — Telehealth: Payer: Self-pay

## 2023-06-07 ENCOUNTER — Other Ambulatory Visit: Payer: Self-pay | Admitting: Family Medicine

## 2023-06-07 DIAGNOSIS — M5432 Sciatica, left side: Secondary | ICD-10-CM

## 2023-06-07 NOTE — Telephone Encounter (Signed)
 Forwarding message below

## 2023-06-07 NOTE — Telephone Encounter (Signed)
 Copied from CRM [id number]. Topic: General - Other >> Jun 07, 2023  1:55 PM Howard Macho wrote: Reason for CRM: patient called stating she saw her provider for sciatic pain on 3/25 and she was given medication and it helped but now that she is off the medication the pain is back. Patient was told that the doctor would put in referral for her CB [phone]

## 2023-06-07 NOTE — Telephone Encounter (Signed)
 Please review and place referral.   Thanks. Dm/cma

## 2023-06-08 NOTE — Telephone Encounter (Signed)
 Patient notified VIA phone. Dm/cma

## 2023-06-09 ENCOUNTER — Other Ambulatory Visit: Payer: Self-pay

## 2023-06-09 ENCOUNTER — Emergency Department (HOSPITAL_BASED_OUTPATIENT_CLINIC_OR_DEPARTMENT_OTHER)

## 2023-06-09 ENCOUNTER — Emergency Department (HOSPITAL_BASED_OUTPATIENT_CLINIC_OR_DEPARTMENT_OTHER)
Admission: EM | Admit: 2023-06-09 | Discharge: 2023-06-09 | Disposition: A | Discharge status: Home / Self Care | Attending: Emergency Medicine | Admitting: Emergency Medicine

## 2023-06-09 ENCOUNTER — Encounter (HOSPITAL_BASED_OUTPATIENT_CLINIC_OR_DEPARTMENT_OTHER): Payer: Self-pay | Admitting: Emergency Medicine

## 2023-06-09 DIAGNOSIS — R0781 Pleurodynia: Secondary | ICD-10-CM | POA: Diagnosis present

## 2023-06-09 DIAGNOSIS — R1012 Left upper quadrant pain: Secondary | ICD-10-CM | POA: Insufficient documentation

## 2023-06-09 DIAGNOSIS — Z7982 Long term (current) use of aspirin: Secondary | ICD-10-CM | POA: Diagnosis not present

## 2023-06-09 DIAGNOSIS — R1011 Right upper quadrant pain: Secondary | ICD-10-CM | POA: Insufficient documentation

## 2023-06-09 DIAGNOSIS — K760 Fatty (change of) liver, not elsewhere classified: Secondary | ICD-10-CM | POA: Diagnosis not present

## 2023-06-09 DIAGNOSIS — K5900 Constipation, unspecified: Secondary | ICD-10-CM | POA: Insufficient documentation

## 2023-06-09 DIAGNOSIS — M940 Chondrocostal junction syndrome [Tietze]: Secondary | ICD-10-CM | POA: Diagnosis not present

## 2023-06-09 DIAGNOSIS — K5732 Diverticulitis of large intestine without perforation or abscess without bleeding: Secondary | ICD-10-CM | POA: Diagnosis not present

## 2023-06-09 LAB — URINALYSIS, ROUTINE W REFLEX MICROSCOPIC
Bilirubin Urine: NEGATIVE
Glucose, UA: NEGATIVE mg/dL
Hgb urine dipstick: NEGATIVE
Ketones, ur: NEGATIVE mg/dL
Nitrite: NEGATIVE
Protein, ur: NEGATIVE mg/dL
Specific Gravity, Urine: 1.015 (ref 1.005–1.030)
pH: 5.5 (ref 5.0–8.0)

## 2023-06-09 LAB — COMPREHENSIVE METABOLIC PANEL WITH GFR
ALT: 14 U/L (ref 0–44)
AST: 20 U/L (ref 15–41)
Albumin: 4.1 g/dL (ref 3.5–5.0)
Alkaline Phosphatase: 46 U/L (ref 38–126)
Anion gap: 10 (ref 5–15)
BUN: 9 mg/dL (ref 8–23)
CO2: 25 mmol/L (ref 22–32)
Calcium: 9.2 mg/dL (ref 8.9–10.3)
Chloride: 104 mmol/L (ref 98–111)
Creatinine, Ser: 0.79 mg/dL (ref 0.44–1.00)
GFR, Estimated: >60 mL/min (ref >60)
Glucose, Bld: 88 mg/dL (ref 70–99)
Potassium: 4.3 mmol/L (ref 3.5–5.1)
Sodium: 139 mmol/L (ref 135–145)
Total Bilirubin: <0.2 mg/dL (ref 0.0–1.2)
Total Protein: 7.3 g/dL (ref 6.5–8.1)

## 2023-06-09 LAB — LIPASE, BLOOD: Lipase: 26 U/L (ref 11–51)

## 2023-06-09 LAB — URINALYSIS, MICROSCOPIC (REFLEX)

## 2023-06-09 LAB — CBC
HCT: 43.9 % (ref 36.0–46.0)
Hemoglobin: 14.5 g/dL (ref 12.0–15.0)
MCH: 30.3 pg (ref 26.0–34.0)
MCHC: 33 g/dL (ref 30.0–36.0)
MCV: 91.8 fL (ref 80.0–100.0)
Platelets: 236 10*3/uL (ref 150–400)
RBC: 4.78 MIL/uL (ref 3.87–5.11)
RDW: 12.5 % (ref 11.5–15.5)
WBC: 8 10*3/uL (ref 4.0–10.5)
nRBC: 0 % (ref 0.0–0.2)

## 2023-06-09 MED ORDER — LIDOCAINE 5 % EX PTCH
1.0000 | MEDICATED_PATCH | CUTANEOUS | Status: DC
  Administered 2023-06-09: 1 via TRANSDERMAL
  Filled 2023-06-09: qty 1

## 2023-06-09 MED ORDER — LIDOCAINE 5 % EX PTCH: 1.0000 | MEDICATED_PATCH | CUTANEOUS | 0 refills | Status: AC

## 2023-06-09 MED ORDER — IOHEXOL 300 MG/ML  SOLN
100.0000 mL | Freq: Once | INTRAMUSCULAR | Status: AC | PRN
  Administered 2023-06-09: 100 mL via INTRAVENOUS

## 2023-06-09 NOTE — ED Notes (Signed)
 Pt alert and oriented X 4 at the time of discharge. RR even and unlabored. No acute distress noted. Pt verbalized understanding of discharge instructions as discussed. Pt ambulatory to lobby at time of discharge.

## 2023-06-09 NOTE — ED Provider Notes (Signed)
 [facility] Provider Note   CSN: [id number] Arrival date & time: 06/09/23  1324     History  Chief Complaint  Patient presents with   Abdominal Pain    Betty [name] is a 78 y.o. female.  Patient is a 78 year old female with past medical history of appendectomy presenting for complaints of abdominal pain.  Patient admits to left upper quadrant abdominal pain over the past 2 to 3 weeks.  It has been constant and worse after eating.  She denies any nausea, vomiting, diarrhea.  Admits to intermittent constipation but states she took over-the-counter medications yesterday and had a soft bowel movement this morning.  Denies any black or bloody stools.  The history is provided by the patient. No language interpreter was used.  Abdominal Pain Associated symptoms: no chest pain, no chills, no cough, no dysuria, no fever, no hematuria, no shortness of breath, no sore throat and no vomiting        Home Medications Prior to Admission medications   Medication Sig Start Date End Date Taking? Authorizing Provider  lidocaine (LIDODERM) 5 % Place 1 patch onto the skin daily. Remove & Discard patch within 12 hours or as directed by MD 06/09/23  Yes Edwin Dada P, DO  Ascorbic Acid (VITAMIN C) 500 MG CAPS Take 500 capsules by mouth daily.    [provider]  aspirin 81 MG EC tablet Take 81 mg by mouth daily. Swallow whole. Patient not taking: Reported on 05/18/2023    [provider]  b complex vitamins capsule Take 1 capsule by mouth daily.    [provider]  carboxymethylcellulose (REFRESH PLUS) 0.5 % SOLN 1 drop 3 (three) times daily as needed.    [provider]  cholecalciferol (VITAMIN D3) 25 MCG (1000 UNIT) tablet Take 1,000 Units by mouth daily.    [provider]  fluticasone (FLONASE) 50 MCG/ACT nasal spray Place 1 spray into both nostrils daily. 04/04/21   Deeann Saint, MD  LORATADINE PO Take  by mouth.    [provider]  Multiple Vitamin (MULTIVITAMIN WITH MINERALS) TABS tablet Take 1 tablet by mouth daily.    [provider]  naproxen (NAPROSYN) 500 MG tablet TAKE 1 TABLET BY MOUTH TWICE DAILY WITH A MEAL 06/08/23   Loyola Mast, MD  omeprazole (PRILOSEC) 20 MG capsule Take 20 mg by mouth daily as needed. 06/19/22   [provider]  pravastatin (PRAVACHOL) 20 MG tablet Take 1 tablet (20 mg total) by mouth daily. 05/04/23   Loyola Mast, MD  vitamin B-12 (CYANOCOBALAMIN) 100 MCG tablet Take 100 mcg by mouth every other day.    [provider]  vitamin k 100 MCG tablet Take 100 mcg by mouth daily.    [provider]      Allergies    Sulfa antibiotics    Review of Systems   Review of Systems  Constitutional:  Negative for chills and fever.  HENT:  Negative for ear pain and sore throat.   Eyes:  Negative for pain and visual disturbance.  Respiratory:  Negative for cough and shortness of breath.   Cardiovascular:  Negative for chest pain and palpitations.  Gastrointestinal:  Positive for abdominal pain. Negative for vomiting.  Genitourinary:  Negative for dysuria and hematuria.  Musculoskeletal:  Negative for arthralgias and back pain.  Skin:  Negative for color change and rash.  Neurological:  Negative for seizures and syncope.  All  other systems reviewed and are negative.   Physical Exam Updated Vital Signs BP (!) 151/101   Pulse 79   Temp 98 F (36.7 C) (Oral)   Resp 20   Ht 5\' 3"  (1.6 m)   Wt 62.6 kg   SpO2 99%   BMI 24.45 kg/m  Physical Exam Vitals and nursing note reviewed.  Constitutional:      General: She is not in acute distress.    Appearance: She is well-developed.  HENT:     Head: Normocephalic and atraumatic.  Eyes:     Conjunctiva/sclera: Conjunctivae normal.  Cardiovascular:     Rate and Rhythm: Normal rate and regular rhythm.     Heart sounds: No murmur heard. Pulmonary:     Effort:  Pulmonary effort is normal. No respiratory distress.     Breath sounds: Normal breath sounds.  Chest:     Chest wall: Tenderness present. No mass, deformity or swelling.    Abdominal:     Palpations: Abdomen is soft.     Tenderness: There is abdominal tenderness in the left upper quadrant. There is guarding. There is no rebound.  Musculoskeletal:        General: No swelling.     Cervical back: Neck supple.  Skin:    General: Skin is warm and dry.     Capillary Refill: Capillary refill takes less than 2 seconds.  Neurological:     Mental Status: She is alert.  Psychiatric:        Mood and Affect: Mood normal.     ED Results / Procedures / Treatments   Labs (all labs ordered are listed, but only abnormal results are displayed) Labs Reviewed  URINALYSIS, ROUTINE W REFLEX MICROSCOPIC - Abnormal; Notable for the following components:      Result Value   Leukocytes,Ua TRACE (*)    All other components within normal limits  URINALYSIS, MICROSCOPIC (REFLEX) - Abnormal; Notable for the following components:   Bacteria, UA FEW (*)    All other components within normal limits  LIPASE, BLOOD  COMPREHENSIVE METABOLIC PANEL WITH GFR  CBC    EKG None  Radiology CT ABDOMEN PELVIS W CONTRAST Result Date: 06/09/2023 CLINICAL DATA:  Left upper quadrant abdominal pain EXAM: CT ABDOMEN AND PELVIS WITH CONTRAST TECHNIQUE: Multidetector CT imaging of the abdomen and pelvis was performed using the standard protocol following bolus administration of intravenous contrast. RADIATION DOSE REDUCTION: This exam was performed according to the departmental dose-optimization program which includes automated exposure control, adjustment of the mA and/or kV according to patient size and/or use of iterative reconstruction technique. CONTRAST:  OMNIPAQUE IOHEXOL 300 MG/ML  SOLN COMPARISON:  None Available. FINDINGS: Lower chest: No acute abnormality. Hepatobiliary: Mild hepatic steatosis. No enhancing  intrahepatic mass. 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 No intra or extrahepatic biliary ductal dilation. Gallbladder unremarkable. Pancreas: Unremarkable Spleen: Unremarkable Adrenals/Urinary Tract: Adrenal glands are unremarkable. Kidneys are normal, without renal calculi, focal lesion, or hydronephrosis. Bladder is unremarkable. Stomach/Bowel: There is mild pericolonic inflammatory stranding involving the mid descending colon in keeping with changes of acute, mild, uncomplicated descending colonic diverticulitis. Background moderate descending and sigmoid colonic diverticulosis. Stomach, small bowel, and large bowel are otherwise unremarkable. Appendix absent. No evidence of obstruction or perforation. No free intraperitoneal gas or fluid. Vascular/Lymphatic: No significant vascular findings are present. No enlarged abdominal or pelvic lymph nodes. Reproductive: Uterus and bilateral adnexa are unremarkable. Other: No abdominal wall hernia or abnormality.  No abdominopelvic ascites. Musculoskeletal: No acute or significant osseous findings. IMPRESSION: 1. Mild, uncomplicated descending colonic diverticulitis. 2. Mild hepatic steatosis. Electronically Signed   By: Worthy Heads M.D.   On: 06/09/2023 20:27    Procedures Procedures    Medications Ordered in ED Medications  lidocaine (LIDODERM) 5 % 1 patch (has no administration in time range)  iohexol (OMNIPAQUE) 300 MG/ML solution 100 mL (100 mLs Intravenous Contrast Given 06/09/23 1649)    ED Course/ Medical Decision Making/ A&P                                 Medical Decision Making Amount and/or Complexity of Data Reviewed Labs: ordered. Radiology: ordered.  Risk Prescription drug management.   43:89 PM 78 year old female with past medical history of appendectomy presenting for complaints of right upper quadrant side/abdominal pain.  Patient is alert and oriented x 3, no acute distress,  afebrile, stable to signs.  On physical exam patient has tenderness to palpation of the lower ribs on the left side as well as the left upper quadrant.  No history of infectious signs or symptoms of mono.  No splenomegaly palpated.  Laboratory studies are stable with no signs or symptoms of infection or sepsis.  Urine demonstrates no urinary tract infection-doubt pyelo.  No hematuria-doubt nephrolithiasis.  CT abdomen pelvis demonstrates no acute process.  Symptoms likely musculoskeletal in nature and secondary to costochondritis.  Patient states she is in physical therapy and doing a lot of twisting motions lately.  She declined any medication for pain control outside of Lidoderm pain patches.  Prescription sent to pharmacy.  Recommend close follow-up with PCP in the next 10 days if symptoms do not resolve.  Patient in no distress and overall condition improved here in the ED. Detailed discussions were had with the patient regarding current findings, and need for close f/u with PCP or on call doctor. The patient has been instructed to return immediately if the symptoms worsen in any way for re-evaluation. Patient verbalized understanding and is in agreement with current care plan. All questions answered prior to discharge.         Final Clinical Impression(s) / ED Diagnoses Final diagnoses:  Costochondritis    Rx / DC Orders ED Discharge Orders          Ordered    lidocaine (LIDODERM) 5 %  Every 24 hours        06/09/23 2039              Quinn Bucco, DO 06/09/23 2041

## 2023-06-09 NOTE — ED Triage Notes (Signed)
 Left upper and lower abd pain x 1 month,  states is constipated no n/v no sob denies dysuria  no injury

## 2023-06-15 ENCOUNTER — Encounter: Payer: Self-pay | Admitting: Orthopaedic Surgery

## 2023-06-15 ENCOUNTER — Ambulatory Visit: Admitting: Orthopaedic Surgery

## 2023-06-15 ENCOUNTER — Other Ambulatory Visit (INDEPENDENT_AMBULATORY_CARE_PROVIDER_SITE_OTHER)

## 2023-06-15 DIAGNOSIS — M5442 Lumbago with sciatica, left side: Secondary | ICD-10-CM

## 2023-06-15 MED ORDER — CYCLOBENZAPRINE HCL 5 MG PO TABS: 5.0000 mg | ORAL_TABLET | Freq: Every evening | ORAL | 3 refills | Status: DC | PRN

## 2023-06-15 NOTE — Progress Notes (Signed)
 Office Visit Note   Patient: Betty [name]           Date of Birth: 1945-03-28           MRN: [id number] Visit Date: 06/15/2023              Requested by: Graig Lawyer, MD [address] [place],  [place] [address] PCP: Graig Lawyer, MD   Assessment & Plan: Visit Diagnoses:  1. Acute left-sided low back pain with left-sided sciatica     Plan: Patient is 78 year old female with left-sided sciatica.  She would like to continue to take naproxen  and keep her appointment for physical therapy.  I will send in some Flexeril  to help with the charley horse sensation that she is mainly experiencing at night.  Will see her back as needed.  Follow-Up Instructions: No follow-ups on file.   Orders:  Orders Placed This Encounter  Procedures   XR Lumbar Spine 2-3 Views   Meds ordered this encounter  Medications   cyclobenzaprine  (FLEXERIL ) 5 MG tablet    Sig: Take 1 tablet (5 mg total) by mouth at bedtime as needed for muscle spasms.    Dispense:  10 tablet    Refill:  3      Procedures: No procedures performed   Clinical Data: No additional findings.   Subjective: Chief Complaint  Patient presents with   Lower Back - Pain    HPI Betty [name] is a 78 year old female here for evaluation of low back pain and left leg radicular symptoms for about a month.  Pain travels down her leg with some numbness.  Reports a charley horse feeling in her leg.  Naproxen  has been helping.  Denies any red flag symptoms.  Has physical therapy set up for 06/24/2023.  Denies any weakness. Review of Systems  Constitutional: Negative.   HENT: Negative.    Eyes: Negative.   Respiratory: Negative.    Cardiovascular: Negative.   Endocrine: Negative.   Musculoskeletal: Negative.   Neurological: Negative.   Hematological: Negative.   Psychiatric/Behavioral: Negative.    All other systems reviewed and are negative.    Objective: Vital Signs: There were no vitals taken for this  visit.  Physical Exam Vitals and nursing note reviewed.  Constitutional:      Appearance: She is well-developed.  HENT:     Head: Atraumatic.     Nose: Nose normal.  Eyes:     Extraocular Movements: Extraocular movements intact.  Cardiovascular:     Pulses: Normal pulses.  Pulmonary:     Effort: Pulmonary effort is normal.  Abdominal:     Palpations: Abdomen is soft.  Musculoskeletal:     Cervical back: Neck supple.  Skin:    General: Skin is warm.     Capillary Refill: Capillary refill takes less than 2 seconds.  Neurological:     Mental Status: She is alert. Mental status is at baseline.  Psychiatric:        Behavior: Behavior normal.        Thought Content: Thought content normal.        Judgment: Judgment normal.     Ortho Exam Examination of bilateral lower extremity shows no focal motor or sensory deficits.  Normal reflexes. Specialty Comments:  No specialty comments available.  Imaging: XR Lumbar Spine 2-3 Views Result Date: 06/15/2023 X-rays of the lumbar spine showed no acute or structural abnormalities.  Mild facet disease.    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   Gastroesophageal reflux disease 10/21/2018   Hyperlipidemia 10/21/2018   History of pulmonary embolus (PE) - Dx'd 05/2018  06/21/2018   Past Medical History:  Diagnosis Date   High cholesterol    History of pulmonary embolus (PE) - Dx'd 05/2018  06/21/2018    Family  History  Problem Relation Age of Onset   Hypertension Mother    Hypertension Father    Alzheimer's disease Father    Cancer Daughter        Breast   Cancer Paternal Aunt    Heart disease Paternal Grandmother     Past Surgical History:  Procedure Laterality Date   CESAREAN SECTION     EYE SURGERY     LAPAROSCOPIC APPENDECTOMY N/A 07/06/2019   Procedure: APPENDECTOMY LAPAROSCOPIC, TAP BLOCK;  Surgeon: Candyce Champagne, MD;  Location: [facility];  Service: General;  Laterality: N/A;   Social History   Occupational History   Occupation: retired    Associate Professor: [employer]  Tobacco Use   Smoking status: Never   Smokeless tobacco: Never  Vaping Use   Vaping status: Never Used  Substance and Sexual Activity   Alcohol use: Never   Drug use: Never   Sexual activity: Not Currently

## 2023-06-24 ENCOUNTER — Ambulatory Visit: Attending: Family Medicine

## 2023-06-24 DIAGNOSIS — M5432 Sciatica, left side: Secondary | ICD-10-CM | POA: Diagnosis not present

## 2023-06-24 DIAGNOSIS — M6281 Muscle weakness (generalized): Secondary | ICD-10-CM | POA: Diagnosis not present

## 2023-06-24 DIAGNOSIS — M5417 Radiculopathy, lumbosacral region: Secondary | ICD-10-CM | POA: Insufficient documentation

## 2023-06-24 NOTE — Therapy (Signed)
 OUTPATIENT PHYSICAL THERAPY THORACOLUMBAR EVALUATION   Patient Name: Betty [name] MRN: [id number] DOB:Jan 09, 1946, 78 y.o., female Today's Date: 06/24/2023  END OF SESSION:  PT End of Session - 06/24/23 1529     Visit Number 1    Date for PT Re-Evaluation 09/16/23    Authorization Type [employer]    PT Start Time 1545    PT Stop Time 1630    PT Time Calculation (min) 45 min             Past Medical History:  Diagnosis Date   High cholesterol    History of pulmonary embolus (PE) - Dx'd 05/2018  06/21/2018   Past Surgical History:  Procedure Laterality Date   CESAREAN SECTION     EYE SURGERY     LAPAROSCOPIC APPENDECTOMY N/A 07/06/2019   Procedure: APPENDECTOMY LAPAROSCOPIC, TAP BLOCK;  Surgeon: Betty Champagne, MD;  Location: [facility];  Service: General;  Laterality: N/A;   Patient Active Problem List   Diagnosis Date Noted   Sciatica of left side 05/18/2023   Allergic rhinitis 09/26/2021   Dysphagia 06/13/2020   Prediabetes 08/02/2019   Acute phlegmonous appendicitis s/p lap appendectomy 07/06/2019 07/05/2019   Noncompliance with medication treatment due to intermittent use of medication 07/05/2019   Cataract 05/03/2019   Osteopenia of left upper arm 05/03/2019   Arthritis of left acromioclavicular joint 05/03/2019   Chronic pain of both shoulders 05/03/2019   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   Gastroesophageal reflux disease 10/21/2018   Hyperlipidemia 10/21/2018   History of pulmonary embolus (PE) - Dx'd 05/2018  06/21/2018    PCP: Betty [name]  REFERRING PROVIDER: Remo [name]  REFERRING DIAG:  [phone] (ICD-10-CM) - Sciatica of left side    Rationale for Evaluation and Treatment: Rehabilitation  THERAPY DIAG:  Radiculopathy, lumbosacral region  Muscle weakness (generalized)  ONSET DATE: 06/07/23  SUBJECTIVE:                                                                                                                                                                                            SUBJECTIVE STATEMENT: The doctor diagnosed me with sciatica. The pain is in the L side but seems like it is moving in to the R side. The legs are restless at night. I am not a pill popper so if I can rub it and try to do exercises I rather do that. I walk a lot, but my leg feels like it might give out. I think the pain may have started when I was trying to pain and reached up and it felt like I pulled something.   PERTINENT  HISTORY:  Betty [name] is a 78 year old female here for evaluation of low back pain and left leg radicular symptoms for about a month.  Pain travels down her leg with some numbness.  Reports a charley horse feeling in her leg.  Naproxen  has been helping.  Denies any red flag symptoms.  Has physical therapy set up for 06/24/2023.  Denies any weakness.   Plan: Patient is 78 year old female with left-sided sciatica.  She would like to continue to take naproxen  and keep her appointment for physical therapy.  I will send in some Flexeril  to help with the charley horse sensation that she is mainly experiencing at night.  Will see her back as needed.   PAIN:  Are you having pain? Yes: NPRS scale: when it does hurt a 7/10, otherwise no pain Pain location: L side low back and into LLE, some into R low back Pain description: electrical, zapping pain Aggravating factors: sometimes when I try to walk Relieving factors: naproxen , try to exercise   PRECAUTIONS: None  RED FLAGS: None   WEIGHT BEARING RESTRICTIONS: No  FALLS:  Has patient fallen in last 6 months? No  LIVING ENVIRONMENT: Lives with: lives alone Lives in: House/apartment Stairs: No  OCCUPATION: Retired  PLOF: Independent  PATIENT GOALS: just get this sciatica under control so I don't have issues with walking and moving   NEXT MD VISIT:   OBJECTIVE:  Note: Objective measures were completed at Evaluation unless otherwise noted.  DIAGNOSTIC FINDINGS:  X-rays of the  lumbar spine showed no acute or structural abnormalities.   Mild facet disease.    COGNITION: Overall cognitive status: Within functional limits for tasks assessed     SENSATION: WFL  MUSCLE LENGTH: Hamstrings: mog tightness in BLE  POSTURE: No Significant postural limitations  PALPATION: No TTP  LUMBAR ROM:   AROM eval  Flexion Tightness in HS  Extension Limited 50%  Right lateral flexion Fib head  Left lateral flexion Fib head  Right rotation 50% limited  Left rotation 50% limited    (Blank rows = not tested)  LOWER EXTREMITY ROM:   grossly WNL    LOWER EXTREMITY MMT:  4+/5 overall strength    LUMBAR SPECIAL TESTS:  Straight leg raise test: Positive, Slump test: Positive, and FABER test: Positive  FUNCTIONAL TESTS:  5 times sit to stand: 13.54s  GAIT: Distance walked: I clinic distances Assistive device utilized: None Level of assistance: Complete Independence  TREATMENT DATE: 06/24/23- EVAL and HEP                                                                                                                                  PATIENT EDUCATION:  Education details: POC, sciatica A&P, HEP  Person educated: Patient Education method: Explanation Education comprehension: verbalized understanding and returned demonstration  HOME EXERCISE PROGRAM: Access Code: [email or web address] URL: [email or web address] Date: 06/24/2023 Prepared by: Betty [name]  Exercises - Supine Lower Trunk Rotation  -  1 x daily - 7 x weekly - 3 sets - 10 reps - Supine Bridge  - 1 x daily - 7 x weekly - 3 sets - 10 reps - Supine Single Knee to Chest Stretch  - 1 x daily - 7 x weekly - 2 reps - 15 hold - Seated Slump Nerve Glide  - 1 x daily - 7 x weekly - 2 sets - 10 reps - Seated Hamstring Stretch  - 1 x daily - 7 x weekly - 2 reps - 15 hold - Seated Piriformis Stretch with Trunk Bend  - 1 x daily - 7 x weekly - 2 reps - 15 hold  ASSESSMENT:  CLINICAL IMPRESSION: Patient is a  78 y.o. female who was seen today for physical therapy evaluation and treatment for lumbar radiculopathy. Her pain is mostly L sided low back but has moved some to the R side. She reports some numbness and tingling into her LLE. She is fairly active and tries to walk every day. Pt has a history with vertigo, has a hard time completely flat. Was able to demonstrate HEP with head slightly elevated. She will benefit from skilled PT to address her back pain and sciatic symptoms to be able to sleep peacefully at night and do her daily walks with ease.   OBJECTIVE IMPAIRMENTS: decreased ROM, dizziness, increased muscle spasms, and pain.   REHAB POTENTIAL: Good  CLINICAL DECISION MAKING: Stable/uncomplicated  EVALUATION COMPLEXITY: Low   GOALS: Goals reviewed with patient? Yes  SHORT TERM GOALS: Target date: 08/05/23  Patient will be independent with initial HEP.  Baseline: given 06/24/23 Goal status: INITIAL  [place] TERM GOALS: Target date: 09/16/23  Patient will be independent with advanced/ongoing HEP to improve outcomes and carryover.  Baseline:  Goal status: INITIAL  2.  Patient will report 50-75% improvement in low back pain to improve QOL. (<3/10 or better when it does hurt)  Baseline: 7/10  Goal status: INITIAL  3.  Patient will report centralization of radicular symptoms.  Baseline: pain into LLE Goal status: INITIAL  4.  Patient will demonstrate full pain free lumbar ROM to perform ADLs.   Baseline: see chart Goal status: INITIAL  5.  Patient will be able to sleep through the night with increased pain levels  Baseline: has a hard time getting comfortable and sleeping at night Goal status: INITIAL  PLAN:  PT FREQUENCY: 1x/week  PT DURATION: 12 weeks  PLANNED INTERVENTIONS: 97110-Therapeutic exercises, 97530- Therapeutic activity, 97112- Neuromuscular re-education, 97535- Self Care, 16109- Manual therapy, [id number]- Electrical stimulation (manual), [id number]- Traction (mechanical),  [id number]- Ionotophoresis 4mg /ml Dexamethasone , Patient/Family education, Balance training, Stair training, Taping, Dry Needling, Joint mobilization, Spinal mobilization, Vestibular training, Cryotherapy, and Moist heat.  PLAN FOR NEXT SESSION: low back mobility and stretching- be aware of dizziness with laying flat, light strengthening, manual traction    Betty [name], PT 06/24/2023, 4:27 PM

## 2023-06-27 ENCOUNTER — Other Ambulatory Visit: Payer: Self-pay | Admitting: Family Medicine

## 2023-06-27 DIAGNOSIS — M5432 Sciatica, left side: Secondary | ICD-10-CM

## 2023-06-29 NOTE — Therapy (Signed)
 OUTPATIENT PHYSICAL THERAPY THORACOLUMBAR TREATMENT   Patient Name: TALEEN DICKARD MRN: [id number] DOB:1945/04/27, 78 y.o., female Today's Date: 06/30/2023  END OF SESSION:  PT End of Session - 06/30/23 0843     Visit Number 2    Date for PT Re-Evaluation 09/16/23    Authorization Type [employer]    PT Start Time 0845    PT Stop Time 0930    PT Time Calculation (min) 45 min              Past Medical History:  Diagnosis Date   High cholesterol    History of pulmonary embolus (PE) - Dx'd 05/2018  06/21/2018   Past Surgical History:  Procedure Laterality Date   CESAREAN SECTION     EYE SURGERY     LAPAROSCOPIC APPENDECTOMY N/A 07/06/2019   Procedure: APPENDECTOMY LAPAROSCOPIC, TAP BLOCK;  Surgeon: Candyce Champagne, MD;  Location: [facility];  Service: General;  Laterality: N/A;   Patient Active Problem List   Diagnosis Date Noted   Sciatica of left side 05/18/2023   Allergic rhinitis 09/26/2021   Dysphagia 06/13/2020   Prediabetes 08/02/2019   Acute phlegmonous appendicitis s/p lap appendectomy 07/06/2019 07/05/2019   Noncompliance with medication treatment due to intermittent use of medication 07/05/2019   Cataract 05/03/2019   Osteopenia of left upper arm 05/03/2019   Arthritis of left acromioclavicular joint 05/03/2019   Chronic pain of both shoulders 05/03/2019   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   Gastroesophageal reflux disease 10/21/2018   Hyperlipidemia 10/21/2018   History of pulmonary embolus (PE) - Dx'd 05/2018  06/21/2018    PCP: Remo Carls  REFERRING PROVIDER: Remo Carls  REFERRING DIAG:  [phone] (ICD-10-CM) - Sciatica of left side    Rationale for Evaluation and Treatment: Rehabilitation  THERAPY DIAG:  Radiculopathy, lumbosacral region  Muscle weakness (generalized)  Left sided sciatica  ONSET DATE: 06/07/23  SUBJECTIVE:                                                                                                                                                                                            SUBJECTIVE STATEMENT: Started taking Naproxen  again, pain comes and goes. Right now I am okay.   EVAL-The doctor diagnosed me with sciatica. The pain is in the L side but seems like it is moving in to the R side. The legs are restless at night. I am not a pill popper so if I can rub it and try to do exercises I rather do that. I walk a lot, but my leg feels like it might give out. I think the pain may have  started when I was trying to pain and reached up and it felt like I pulled something.   PERTINENT HISTORY:  Carolee is a 78 year old female here for evaluation of low back pain and left leg radicular symptoms for about a month.  Pain travels down her leg with some numbness.  Reports a charley horse feeling in her leg.  Naproxen  has been helping.  Denies any red flag symptoms.  Has physical therapy set up for 06/24/2023.  Denies any weakness.   Plan: Patient is 78 year old female with left-sided sciatica.  She would like to continue to take naproxen  and keep her appointment for physical therapy.  I will send in some Flexeril  to help with the charley horse sensation that she is mainly experiencing at night.  Will see her back as needed.   PAIN:  Are you having pain? Yes: NPRS scale: when it does hurt a 7/10, otherwise no pain Pain location: L side low back and into LLE, some into R low back Pain description: electrical, zapping pain Aggravating factors: sometimes when I try to walk Relieving factors: naproxen , try to exercise   PRECAUTIONS: None  RED FLAGS: None   WEIGHT BEARING RESTRICTIONS: No  FALLS:  Has patient fallen in last 6 months? No  LIVING ENVIRONMENT: Lives with: lives alone Lives in: House/apartment Stairs: No  OCCUPATION: Retired  PLOF: Independent  PATIENT GOALS: just get this sciatica under control so I don't have issues with walking and moving   NEXT MD VISIT:   OBJECTIVE:  Note:  Objective measures were completed at Evaluation unless otherwise noted.  DIAGNOSTIC FINDINGS:  X-rays of the lumbar spine showed no acute or structural abnormalities.   Mild facet disease.    COGNITION: Overall cognitive status: Within functional limits for tasks assessed     SENSATION: WFL  MUSCLE LENGTH: Hamstrings: mog tightness in BLE  POSTURE: No Significant postural limitations  PALPATION: No TTP  LUMBAR ROM:   AROM eval  Flexion Tightness in HS  Extension Limited 50%  Right lateral flexion Fib head  Left lateral flexion Fib head  Right rotation 50% limited  Left rotation 50% limited    (Blank rows = not tested)  LOWER EXTREMITY ROM:   grossly WNL    LOWER EXTREMITY MMT:  4+/5 overall strength    LUMBAR SPECIAL TESTS:  Straight leg raise test: Positive, Slump test: Positive, and FABER test: Positive  FUNCTIONAL TESTS:  5 times sit to stand: 13.54s  GAIT: Distance walked: I clinic distances Assistive device utilized: None Level of assistance: Complete Independence  TREATMENT DATE:  06/30/23 Bike L2 x85mins  STS x10, x10 with OHP red ball x10 Supine with head elevated  - passive LE stretches- HS, SKTC, glutes, piriformis - bridges 2x10 - feet on pball rotations and knees to chest - SLR 2x10 2 way hip  Calf raises 2x10 Calf stretch 30s x2 Step ups 4"    06/24/23- EVAL and HEP  PATIENT EDUCATION:  Education details: POC, sciatica A&P, HEP  Person educated: Patient Education method: Explanation Education comprehension: verbalized understanding and returned demonstration  HOME EXERCISE PROGRAM: Access Code: [email or web address] URL: [email or web address] Date: 06/24/2023 Prepared by: Donavon Fudge  Exercises - Supine Lower Trunk Rotation  - 1 x daily - 7 x weekly - 3 sets - 10 reps - Supine Bridge  - 1 x daily - 7  x weekly - 3 sets - 10 reps - Supine Single Knee to Chest Stretch  - 1 x daily - 7 x weekly - 2 reps - 15 hold - Seated Slump Nerve Glide  - 1 x daily - 7 x weekly - 2 sets - 10 reps - Seated Hamstring Stretch  - 1 x daily - 7 x weekly - 2 reps - 15 hold - Seated Piriformis Stretch with Trunk Bend  - 1 x daily - 7 x weekly - 2 reps - 15 hold  ASSESSMENT:  CLINICAL IMPRESSION: Patient returns with some c/o ongoing pain but it is not constant and just comes and goes. We started with some mobility work and [employer]. Patient is very tight in her hips. Does well with all interventions today with no pain. She had a very hard time understanding sequencing for step ups, needed constant cues and was still unable to follow commands. Reports this exercise was too confusing for her.     EVAL- Patient is a 78 y.o. female who was seen today for physical therapy evaluation and treatment for lumbar radiculopathy. Her pain is mostly L sided low back but has moved some to the R side. She reports some numbness and tingling into her LLE. She is fairly active and tries to walk every day. Pt has a history with vertigo, has a hard time laying completely flat. Was able to demonstrate HEP with head slightly elevated. She will benefit from skilled PT to address her back pain and sciatic symptoms to be able to sleep peacefully at night and do her daily walks with ease.   OBJECTIVE IMPAIRMENTS: decreased ROM, dizziness, increased muscle spasms, and pain.   REHAB POTENTIAL: Good  CLINICAL DECISION MAKING: Stable/uncomplicated  EVALUATION COMPLEXITY: Low   GOALS: Goals reviewed with patient? Yes  SHORT TERM GOALS: Target date: 08/05/23  Patient will be independent with initial HEP.  Baseline: given 06/24/23 Goal status: INITIAL  [place] TERM GOALS: Target date: 09/16/23  Patient will be independent with advanced/ongoing HEP to improve outcomes and carryover.  Baseline:  Goal status: INITIAL  2.  Patient  will report 50-75% improvement in low back pain to improve QOL. (<3/10 or better when it does hurt)  Baseline: 7/10  Goal status: INITIAL  3.  Patient will report centralization of radicular symptoms.  Baseline: pain into LLE Goal status: INITIAL  4.  Patient will demonstrate full pain free lumbar ROM to perform ADLs.   Baseline: see chart Goal status: INITIAL  5.  Patient will be able to sleep through the night with increased pain levels  Baseline: has a hard time getting comfortable and sleeping at night Goal status: INITIAL  PLAN:  PT FREQUENCY: 1x/week  PT DURATION: 12 weeks  PLANNED INTERVENTIONS: 97110-Therapeutic exercises, 97530- Therapeutic activity, [id number]- Neuromuscular re-education, 97535- Self Care, [id number]- Manual therapy, [id number]- Electrical stimulation (manual), [id number]- Traction (mechanical), [id number]- Ionotophoresis 4mg /ml Dexamethasone , Patient/Family education, Balance training, Stair training, Taping, Dry Needling, Joint mobilization, Spinal mobilization, Vestibular training, Cryotherapy, and Moist heat.  PLAN FOR NEXT SESSION: low back mobility and stretching- be  aware of dizziness with laying flat, light strengthening, manual traction    Donavon Fudge, PT 06/30/2023, 9:27 AM

## 2023-06-30 ENCOUNTER — Ambulatory Visit

## 2023-06-30 DIAGNOSIS — M5417 Radiculopathy, lumbosacral region: Secondary | ICD-10-CM | POA: Diagnosis not present

## 2023-06-30 DIAGNOSIS — M6281 Muscle weakness (generalized): Secondary | ICD-10-CM | POA: Diagnosis not present

## 2023-06-30 DIAGNOSIS — M5432 Sciatica, left side: Secondary | ICD-10-CM

## 2023-07-08 ENCOUNTER — Ambulatory Visit: Admitting: Physical Therapy

## 2023-07-08 DIAGNOSIS — M542 Cervicalgia: Secondary | ICD-10-CM | POA: Diagnosis not present

## 2023-07-13 ENCOUNTER — Ambulatory Visit

## 2023-07-13 DIAGNOSIS — M5432 Sciatica, left side: Secondary | ICD-10-CM | POA: Diagnosis not present

## 2023-07-13 DIAGNOSIS — M5417 Radiculopathy, lumbosacral region: Secondary | ICD-10-CM

## 2023-07-13 DIAGNOSIS — M6281 Muscle weakness (generalized): Secondary | ICD-10-CM | POA: Diagnosis not present

## 2023-07-13 NOTE — Therapy (Signed)
 OUTPATIENT PHYSICAL THERAPY THORACOLUMBAR TREATMENT   Patient Name: Betty [name] MRN: [id number] DOB:1945/12/02, 78 y.o., female Today's Date: 07/13/2023  END OF SESSION:  PT End of Session - 07/13/23 1456     Visit Number 3    Date for PT Re-Evaluation 09/16/23    Authorization Type [employer]    PT Start Time 1500    PT Stop Time 1545    PT Time Calculation (min) 45 min               Past Medical History:  Diagnosis Date   High cholesterol    History of pulmonary embolus (PE) - Dx'd 05/2018  06/21/2018   Past Surgical History:  Procedure Laterality Date   CESAREAN SECTION     EYE SURGERY     LAPAROSCOPIC APPENDECTOMY N/A 07/06/2019   Procedure: APPENDECTOMY LAPAROSCOPIC, TAP BLOCK;  Surgeon: Candyce Champagne, MD;  Location: [facility];  Service: General;  Laterality: N/A;   Patient Active Problem List   Diagnosis Date Noted   Sciatica of left side 05/18/2023   Allergic rhinitis 09/26/2021   Dysphagia 06/13/2020   Prediabetes 08/02/2019   Acute phlegmonous appendicitis s/p lap appendectomy 07/06/2019 07/05/2019   Noncompliance with medication treatment due to intermittent use of medication 07/05/2019   Cataract 05/03/2019   Osteopenia of left upper arm 05/03/2019   Arthritis of left acromioclavicular joint 05/03/2019   Chronic pain of both shoulders 05/03/2019   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   Gastroesophageal reflux disease 10/21/2018   Hyperlipidemia 10/21/2018   History of pulmonary embolus (PE) - Dx'd 05/2018  06/21/2018    PCP: Remo Carls  REFERRING PROVIDER: Remo Carls  REFERRING DIAG:  [id number] (ICD-10-CM) - Sciatica of left side    Rationale for Evaluation and Treatment: Rehabilitation  THERAPY DIAG:  Radiculopathy, lumbosacral region  Muscle weakness (generalized)  Left sided sciatica  ONSET DATE: 06/07/23  SUBJECTIVE:                                                                                                                                                                                            SUBJECTIVE STATEMENT: I am doing okay. Back pain is better, have not really had problems.   EVAL-The doctor diagnosed me with sciatica. The pain is in the L side but seems like it is moving in to the R side. The legs are restless at night. I am not a pill popper so if I can rub it and try to do exercises I rather do that. I walk a lot, but my leg feels like it might give out. I think the pain may  have started when I was trying to pain and reached up and it felt like I pulled something.   PERTINENT HISTORY:  Betty [name] is a 78 year old female here for evaluation of low back pain and left leg radicular symptoms for about a month.  Pain travels down her leg with some numbness.  Reports a charley horse feeling in her leg.  Naproxen  has been helping.  Denies any red flag symptoms.  Has physical therapy set up for 06/24/2023.  Denies any weakness.   Plan: Patient is 78 year old female with left-sided sciatica.  She would like to continue to take naproxen  and keep her appointment for physical therapy.  I will send in some Flexeril  to help with the charley horse sensation that she is mainly experiencing at night.  Will see her back as needed.   PAIN:  Are you having pain? Yes: NPRS scale: when it does hurt a 7/10, otherwise no pain Pain location: L side low back and into LLE, some into R low back Pain description: electrical, zapping pain Aggravating factors: sometimes when I try to walk Relieving factors: naproxen , try to exercise   PRECAUTIONS: None  RED FLAGS: None   WEIGHT BEARING RESTRICTIONS: No  FALLS:  Has patient fallen in last 6 months? No  LIVING ENVIRONMENT: Lives with: lives alone Lives in: House/apartment Stairs: No  OCCUPATION: Retired  PLOF: Independent  PATIENT GOALS: just get this sciatica under control so I don't have issues with walking and moving   NEXT MD VISIT:   OBJECTIVE:  Note:  Objective measures were completed at Evaluation unless otherwise noted.  DIAGNOSTIC FINDINGS:  X-rays of the lumbar spine showed no acute or structural abnormalities.   Mild facet disease.    COGNITION: Overall cognitive status: Within functional limits for tasks assessed     SENSATION: WFL  MUSCLE LENGTH: Hamstrings: mog tightness in BLE  POSTURE: No Significant postural limitations  PALPATION: No TTP  LUMBAR ROM:   AROM eval  Flexion Tightness in HS  Extension Limited 50%  Right lateral flexion Fib head  Left lateral flexion Fib head  Right rotation 50% limited  Left rotation 50% limited    (Blank rows = not tested)  LOWER EXTREMITY ROM:   grossly WNL    LOWER EXTREMITY MMT:  4+/5 overall strength    LUMBAR SPECIAL TESTS:  Straight leg raise test: Positive, Slump test: Positive, and FABER test: Positive  FUNCTIONAL TESTS:  5 times sit to stand: 13.54s  GAIT: Distance walked: I clinic distances Assistive device utilized: None Level of assistance: Complete Independence  TREATMENT DATE:  07/13/23 NuStep L5x98mins  Shoulder ext 5# 2x10  AR 5# 2x10 HS curls 15# 2x10 Leg ext 5# 2x10 Lateral side steps with band  Cone taps  STS on airex 2x10    06/30/23 Bike L2 x1mins  STS x10, x10 with OHP red ball x10 Supine with head elevated  - passive LE stretches- HS, SKTC, glutes, piriformis - bridges 2x10 - feet on pball rotations and knees to chest - SLR 2x10 2 way hip  Calf raises 2x10 Calf stretch 30s x2 Step ups 4"    06/24/23- EVAL and HEP  PATIENT EDUCATION:  Education details: POC, sciatica A&P, HEP  Person educated: Patient Education method: Explanation Education comprehension: verbalized understanding and returned demonstration  HOME EXERCISE PROGRAM: Access Code: [email or web address] URL:  [email or web address] Date: 06/24/2023 Prepared by: Donavon Fudge  Exercises - Supine Lower Trunk Rotation  - 1 x daily - 7 x weekly - 3 sets - 10 reps - Supine Bridge  - 1 x daily - 7 x weekly - 3 sets - 10 reps - Supine Single Knee to Chest Stretch  - 1 x daily - 7 x weekly - 2 reps - 15 hold - Seated Slump Nerve Glide  - 1 x daily - 7 x weekly - 2 sets - 10 reps - Seated Hamstring Stretch  - 1 x daily - 7 x weekly - 2 reps - 15 hold - Seated Piriformis Stretch with Trunk Bend  - 1 x daily - 7 x weekly - 2 reps - 15 hold  ASSESSMENT:  CLINICAL IMPRESSION: Patient returns with some c/o ongoing pain but it is not constant and just comes and goes. She feels that she can do her exercises at home and can be discharged after next visit. Pt reports her back has not given her any major problems lately. We continued with some LE and back strengthening. Reports a little bit of pain in the R knee with leg extensions.    EVAL- Patient is a 78 y.o. female who was seen today for physical therapy evaluation and treatment for lumbar radiculopathy. Her pain is mostly L sided low back but has moved some to the R side. She reports some numbness and tingling into her LLE. She is fairly active and tries to walk every day. Pt has a history with vertigo, has a hard time laying completely flat. Was able to demonstrate HEP with head slightly elevated. She will benefit from skilled PT to address her back pain and sciatic symptoms to be able to sleep peacefully at night and do her daily walks with ease.   OBJECTIVE IMPAIRMENTS: decreased ROM, dizziness, increased muscle spasms, and pain.   REHAB POTENTIAL: Good  CLINICAL DECISION MAKING: Stable/uncomplicated  EVALUATION COMPLEXITY: Low   GOALS: Goals reviewed with patient? Yes  SHORT TERM GOALS: Target date: 08/05/23  Patient will be independent with initial HEP.  Baseline: given 06/24/23 Goal status: MET  [place] TERM GOALS: Target date:  09/16/23  Patient will be independent with advanced/ongoing HEP to improve outcomes and carryover.  Baseline:  Goal status: INITIAL  2.  Patient will report 50-75% improvement in low back pain to improve QOL. (<3/10 or better when it does hurt)  Baseline: 7/10  Goal status: IN PROGRESS 5/10  3.  Patient will report centralization of radicular symptoms.  Baseline: pain into LLE Goal status: IN PROGRESS   4.  Patient will demonstrate full pain free lumbar ROM to perform ADLs.   Baseline: see chart Goal status: INITIAL  5.  Patient will be able to sleep through the night with increased pain levels  Baseline: has a hard time getting comfortable and sleeping at night Goal status: IN PROGRESS  PLAN:  PT FREQUENCY: 1x/week  PT DURATION: 12 weeks  PLANNED INTERVENTIONS: 97110-Therapeutic exercises, 97530- Therapeutic activity, 97112- Neuromuscular re-education, 97535- Self Care, 10272- Manual therapy, [id number]- Electrical stimulation (manual), [id number]- Traction (mechanical), [id number]- Ionotophoresis 4mg /ml Dexamethasone , Patient/Family education, Balance training, Stair training, Taping, Dry Needling, Joint mobilization, Spinal mobilization, Vestibular training, Cryotherapy, and Moist heat.  PLAN FOR NEXT SESSION: low back mobility and stretching- be  aware of dizziness with laying flat, light strengthening, manual traction    Donavon Fudge, PT 07/13/2023, 3:44 PM

## 2023-07-18 ENCOUNTER — Other Ambulatory Visit: Payer: Self-pay

## 2023-07-18 ENCOUNTER — Encounter (HOSPITAL_BASED_OUTPATIENT_CLINIC_OR_DEPARTMENT_OTHER): Payer: Self-pay | Admitting: Emergency Medicine

## 2023-07-18 DIAGNOSIS — Z7982 Long term (current) use of aspirin: Secondary | ICD-10-CM | POA: Insufficient documentation

## 2023-07-18 DIAGNOSIS — Z711 Person with feared health complaint in whom no diagnosis is made: Secondary | ICD-10-CM | POA: Insufficient documentation

## 2023-07-18 NOTE — ED Triage Notes (Signed)
 Pt reports she noticed a "mass" on BUE today; sts they are located on anterior elbows; no obvious abnormality noted; denies pain

## 2023-07-19 ENCOUNTER — Emergency Department (HOSPITAL_BASED_OUTPATIENT_CLINIC_OR_DEPARTMENT_OTHER)
Admission: EM | Admit: 2023-07-19 | Discharge: 2023-07-19 | Disposition: A | Discharge status: Home / Self Care | Attending: Emergency Medicine | Admitting: Emergency Medicine

## 2023-07-19 DIAGNOSIS — Z711 Person with feared health complaint in whom no diagnosis is made: Secondary | ICD-10-CM

## 2023-07-19 NOTE — Discharge Instructions (Signed)
 The knots you feel on your arms appears to be normal anatomy. Please follow up with your doctor if you start having pain or any other concerns.

## 2023-07-19 NOTE — ED Provider Notes (Signed)
 [facility]  Provider Note  CSN: [id number] Arrival date & time: 07/18/23 2159  History Chief Complaint  Patient presents with   Mass    Betty [name] is a 78 y.o. female here for evaluation of a knot she noticed on bilateral antecubital spaces earlier today. There was no pain, but she was concerned about a blood clot.    Home Medications Prior to Admission medications   Medication Sig Start Date End Date Taking? Authorizing Provider  Ascorbic Acid  (VITAMIN C ) 500 MG CAPS Take 500 capsules by mouth daily.    [provider]  aspirin 81 MG EC tablet Take 81 mg by mouth daily. Swallow whole. Patient not taking: Reported on 05/18/2023    [provider]  b complex vitamins capsule Take 1 capsule by mouth daily.    [provider]  carboxymethylcellulose (REFRESH PLUS) 0.5 % SOLN 1 drop 3 (three) times daily as needed.    [provider]  cholecalciferol (VITAMIN D3) 25 MCG (1000 UNIT) tablet Take 1,000 Units by mouth daily.    [provider]  cyclobenzaprine  (FLEXERIL ) 5 MG tablet Take 1 tablet (5 mg total) by mouth at bedtime as needed for muscle spasms. 06/15/23   Wes Hamman, MD  fluticasone  (FLONASE ) 50 MCG/ACT nasal spray Place 1 spray into both nostrils daily. 04/04/21   Viola Greulich, MD  lidocaine  (LIDODERM ) 5 % Place 1 patch onto the skin daily. Remove & Discard patch within 12 hours or as directed by MD 06/09/23   Gray, Alicia P, DO  LORATADINE PO Take by mouth.    [provider]  Multiple Vitamin (MULTIVITAMIN WITH MINERALS) TABS tablet Take 1 tablet by mouth daily.    [provider]  naproxen  (NAPROSYN ) 500 MG tablet TAKE 1 TABLET BY MOUTH TWICE DAILY WITH A MEAL 06/27/23   Webb, Padonda B, FNP  omeprazole  (PRILOSEC) 20 MG capsule Take 20 mg by mouth daily as needed. 06/19/22   [provider]  pravastatin  (PRAVACHOL ) 20 MG tablet Take 1 tablet (20 mg total)  by mouth daily. 05/04/23   Graig Lawyer, MD  vitamin B-12 (CYANOCOBALAMIN ) 100 MCG tablet Take 100 mcg by mouth every other day.    [provider]  vitamin k 100 MCG tablet Take 100 mcg by mouth daily.    [provider]     Allergies    Sulfa antibiotics   Review of Systems   Review of Systems Please see HPI for pertinent positives and negatives  Physical Exam BP (!) 163/87   Pulse 77   Temp 99 F (37.2 C)   Resp 16   Ht 5\' 3"  (1.6 m)   Wt 63.5 kg   SpO2 96%   BMI 24.80 kg/m   Physical Exam Vitals and nursing note reviewed.  HENT:     Head: Normocephalic.     Nose: Nose normal.  Eyes:     Extraocular Movements: Extraocular movements intact.  Pulmonary:     Effort: Pulmonary effort is normal.  Musculoskeletal:        General: Normal range of motion.     Cervical back: Neck supple.     Comments: The areas the patient identifies as the concerning knots appear to be the medial insertion of her biceps.   Skin:    Findings: No rash (on exposed skin).  Neurological:     Mental Status: She is alert and oriented to person, place,  and time.  Psychiatric:        Mood and Affect: Mood normal.     ED Results / Procedures / Treatments   EKG None  Procedures Procedures  Medications Ordered in the ED Medications - No data to display  Initial Impression and Plan  Patient here for concern of blood clot. Her areas of concern appear to be normal anatomy. No concern for DVT, infection, or other acute process. Patient given reassurance. Advised PCP follow up, RTED for any other concerns.    ED Course       MDM Rules/Calculators/A&P Medical Decision Making Problems Addressed: Physically well but worried: self-limited or minor problem     Final Clinical Impression(s) / ED Diagnoses Final diagnoses:  Physically well but worried    Rx / DC Orders ED Discharge Orders     None        Charmayne Cooper, MD 07/19/23 [phone]

## 2023-07-20 ENCOUNTER — Ambulatory Visit

## 2023-07-20 NOTE — Therapy (Incomplete)
 OUTPATIENT PHYSICAL THERAPY THORACOLUMBAR TREATMENT   Patient Name: Betty [name] MRN: [id number] DOB:[birth year], 78 y.o., female Today's Date: 07/20/2023  END OF SESSION:      Past Medical History:  Diagnosis Date   High cholesterol    History of pulmonary embolus (PE) - Dx'd 05/2018  06/21/2018   Past Surgical History:  Procedure Laterality Date   CESAREAN SECTION     EYE SURGERY     LAPAROSCOPIC APPENDECTOMY N/A 07/06/2019   Procedure: APPENDECTOMY LAPAROSCOPIC, TAP BLOCK;  Surgeon: Candyce Champagne, MD;  Location: [facility];  Service: General;  Laterality: N/A;   Patient Active Problem List   Diagnosis Date Noted   Sciatica of left side 05/18/2023   Allergic rhinitis 09/26/2021   Dysphagia 06/13/2020   Prediabetes 08/02/2019   Acute phlegmonous appendicitis s/p lap appendectomy 07/06/2019 07/05/2019   Noncompliance with medication treatment due to intermittent use of medication 07/05/2019   Cataract 05/03/2019   Osteopenia of left upper arm 05/03/2019   Arthritis of left acromioclavicular joint 05/03/2019   Chronic pain of both shoulders 05/03/2019   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   Gastroesophageal reflux disease 10/21/2018   Hyperlipidemia 10/21/2018   History of pulmonary embolus (PE) - Dx'd 05/2018  06/21/2018    PCP: Remo Carls  REFERRING PROVIDER: Remo Carls  REFERRING DIAG:  M54.32 (ICD-10-CM) - Sciatica of left side    Rationale for Evaluation and Treatment: Rehabilitation  THERAPY DIAG:  No diagnosis found.  ONSET DATE: 06/07/23  SUBJECTIVE:                                                                                                                                                                                           SUBJECTIVE STATEMENT: I am doing okay. Back pain is better, have not really had problems.   EVAL-The doctor diagnosed me with sciatica. The pain is in the L side but seems like it is moving in to the R side.  The legs are restless at night. I am not a pill popper so if I can rub it and try to do exercises I rather do that. I walk a lot, but my leg feels like it might give out. I think the pain may have started when I was trying to pain and reached up and it felt like I pulled something.   PERTINENT HISTORY:  Betty [name] is a 78 year old female here for evaluation of low back pain and left leg radicular symptoms for about a month.  Pain travels down her leg with some numbness.  Reports a charley horse feeling in her leg.  Naproxen  has been helping.  Denies  any red flag symptoms.  Has physical therapy set up for 06/24/2023.  Denies any weakness.   Plan: Patient is 78 year old female with left-sided sciatica.  She would like to continue to take naproxen  and keep her appointment for physical therapy.  I will send in some Flexeril  to help with the charley horse sensation that she is mainly experiencing at night.  Will see her back as needed.   PAIN:  Are you having pain? Yes: NPRS scale: when it does hurt a 7/10, otherwise no pain Pain location: L side low back and into LLE, some into R low back Pain description: electrical, zapping pain Aggravating factors: sometimes when I try to walk Relieving factors: naproxen , try to exercise   PRECAUTIONS: None  RED FLAGS: None   WEIGHT BEARING RESTRICTIONS: No  FALLS:  Has patient fallen in last 6 months? No  LIVING ENVIRONMENT: Lives with: lives alone Lives in: House/apartment Stairs: No  OCCUPATION: Retired  PLOF: Independent  PATIENT GOALS: just get this sciatica under control so I don't have issues with walking and moving   NEXT MD VISIT:   OBJECTIVE:  Note: Objective measures were completed at Evaluation unless otherwise noted.  DIAGNOSTIC FINDINGS:  X-rays of the lumbar spine showed no acute or structural abnormalities.   Mild facet disease.    COGNITION: Overall cognitive status: Within functional limits for tasks  assessed     SENSATION: WFL  MUSCLE LENGTH: Hamstrings: mog tightness in BLE  POSTURE: No Significant postural limitations  PALPATION: No TTP  LUMBAR ROM:   AROM eval  Flexion Tightness in HS  Extension Limited 50%  Right lateral flexion Fib head  Left lateral flexion Fib head  Right rotation 50% limited  Left rotation 50% limited    (Blank rows = not tested)  LOWER EXTREMITY ROM:   grossly WNL    LOWER EXTREMITY MMT:  4+/5 overall strength    LUMBAR SPECIAL TESTS:  Straight leg raise test: Positive, Slump test: Positive, and FABER test: Positive  FUNCTIONAL TESTS:  5 times sit to stand: 13.54s  GAIT: Distance walked: I clinic distances Assistive device utilized: None Level of assistance: Complete Independence  TREATMENT DATE:  07/20/23 Recheck goals  NuStep Resisted gait  2# 3 way hip Leg press  STS with OHP  Walking on beam   07/13/23 NuStep L5x76mins  Shoulder ext 5# 2x10  AR 5# 2x10 HS curls 15# 2x10 Leg ext 5# 2x10 Lateral side steps with band  Cone taps  STS on airex 2x10    06/30/23 Bike L2 x65mins  STS x10, x10 with OHP red ball x10 Supine with head elevated  - passive LE stretches- HS, SKTC, glutes, piriformis - bridges 2x10 - feet on pball rotations and knees to chest - SLR 2x10 2 way hip  Calf raises 2x10 Calf stretch 30s x2 Step ups 4"    06/24/23- EVAL and HEP  PATIENT EDUCATION:  Education details: POC, sciatica A&P, HEP  Person educated: Patient Education method: Explanation Education comprehension: verbalized understanding and returned demonstration  HOME EXERCISE PROGRAM: Access Code: [email or web address] URL: [email or web address] Date: 06/24/2023 Prepared by: Donavon Fudge  Exercises - Supine Lower Trunk Rotation  - 1 x daily - 7 x weekly - 3 sets - 10 reps - Supine Bridge  - 1 x daily  - 7 x weekly - 3 sets - 10 reps - Supine Single Knee to Chest Stretch  - 1 x daily - 7 x weekly - 2 reps - 15 hold - Seated Slump Nerve Glide  - 1 x daily - 7 x weekly - 2 sets - 10 reps - Seated Hamstring Stretch  - 1 x daily - 7 x weekly - 2 reps - 15 hold - Seated Piriformis Stretch with Trunk Bend  - 1 x daily - 7 x weekly - 2 reps - 15 hold  ASSESSMENT:  CLINICAL IMPRESSION: Patient returns with some c/o ongoing pain but it is not constant and just comes and goes. She feels that she can do her exercises at home and can be discharged after next visit. Pt reports her back has not given her any major problems lately. We continued with some LE and back strengthening. Reports a little bit of pain in the R knee with leg extensions.    EVAL- Patient is a 78 y.o. female who was seen today for physical therapy evaluation and treatment for lumbar radiculopathy. Her pain is mostly L sided low back but has moved some to the R side. She reports some numbness and tingling into her LLE. She is fairly active and tries to walk every day. Pt has a history with vertigo, has a hard time laying completely flat. Was able to demonstrate HEP with head slightly elevated. She will benefit from skilled PT to address her back pain and sciatic symptoms to be able to sleep peacefully at night and do her daily walks with ease.   OBJECTIVE IMPAIRMENTS: decreased ROM, dizziness, increased muscle spasms, and pain.   REHAB POTENTIAL: Good  CLINICAL DECISION MAKING: Stable/uncomplicated  EVALUATION COMPLEXITY: Low   GOALS: Goals reviewed with patient? Yes  SHORT TERM GOALS: Target date: 08/05/23  Patient will be independent with initial HEP.  Baseline: given 06/24/23 Goal status: MET  [place] TERM GOALS: Target date: 09/16/23  Patient will be independent with advanced/ongoing HEP to improve outcomes and carryover.  Baseline:  Goal status: INITIAL  2.  Patient will report 50-75% improvement in low back pain to  improve QOL. (<3/10 or better when it does hurt)  Baseline: 7/10  Goal status: IN PROGRESS 5/10  3.  Patient will report centralization of radicular symptoms.  Baseline: pain into LLE Goal status: IN PROGRESS   4.  Patient will demonstrate full pain free lumbar ROM to perform ADLs.   Baseline: see chart Goal status: INITIAL  5.  Patient will be able to sleep through the night with increased pain levels  Baseline: has a hard time getting comfortable and sleeping at night Goal status: IN PROGRESS  PLAN:  PT FREQUENCY: 1x/week  PT DURATION: 12 weeks  PLANNED INTERVENTIONS: 97110-Therapeutic exercises, 97530- Therapeutic activity, [id number]- Neuromuscular re-education, 97535- Self Care, 16109- Manual therapy, [id number]- Electrical stimulation (manual), [id number]- Traction (mechanical), [id number]- Ionotophoresis 4mg /ml Dexamethasone , Patient/Family education, Balance training, Stair training, Taping, Dry Needling, Joint mobilization, Spinal mobilization, Vestibular training, Cryotherapy, and Moist heat.  PLAN FOR NEXT SESSION: low back mobility and stretching- be  aware of dizziness with laying flat, light strengthening, manual traction    Donavon Fudge, PT 07/20/2023, 9:24 AM

## 2023-08-03 ENCOUNTER — Other Ambulatory Visit: Payer: Self-pay | Admitting: Family

## 2023-08-03 DIAGNOSIS — M5432 Sciatica, left side: Secondary | ICD-10-CM

## 2023-08-23 ENCOUNTER — Other Ambulatory Visit: Payer: Self-pay | Admitting: Family Medicine

## 2023-08-23 DIAGNOSIS — M5432 Sciatica, left side: Secondary | ICD-10-CM

## 2023-08-23 NOTE — Telephone Encounter (Signed)
 Copied from CRM [id number]. Topic: Clinical - Medication Refill >> Aug 23, 2023 10:54 AM Mesmerise C wrote: Medication:  naproxen  (NAPROSYN ) 500 MG tablet    Has the patient contacted their pharmacy? Yes (Agent: If no, request that the patient contact the pharmacy for the refill. If patient does not wish to contact the pharmacy document the reason why and proceed with request.) (Agent: If yes, when and what did the pharmacy advise?) Stated needed doctor's request  This is the patient's preferred pharmacy:  [facility] [address], [place] - [address] [place] [address] Phone: [phone] Fax: [phone]  Is this the correct pharmacy for this prescription? Yes If no, delete pharmacy and type the correct one.   Has the prescription been filled recently? No  Is the patient out of the medication? No  Has the patient been seen for an appointment in the last year OR does the patient have an upcoming appointment? Yes  Can we respond through MyChart? Yes  Agent: Please be advised that Rx refills may take up to 3 business days. We ask that you follow-up with your pharmacy.

## 2023-10-11 ENCOUNTER — Telehealth: Payer: Self-pay

## 2023-10-11 ENCOUNTER — Ambulatory Visit: Payer: Self-pay

## 2023-10-11 ENCOUNTER — Telehealth: Payer: Self-pay | Admitting: Orthopaedic Surgery

## 2023-10-11 NOTE — Telephone Encounter (Signed)
 VOB submitted for Monovisc, bilateral knee

## 2023-10-11 NOTE — Telephone Encounter (Signed)
Done. See previous message in chart.

## 2023-10-11 NOTE — Telephone Encounter (Signed)
 Patient called and wants to know if she is going to be approved for the Gel shot. CB#[phone]

## 2023-10-11 NOTE — Telephone Encounter (Signed)
 FYI Only or Action Required?: FYI only for provider.  Patient was last seen in primary care on 05/18/2023 by Thedora Garnette HERO, MD.  Called Nurse Triage reporting Sinusitis.  Symptoms began a week ago.  Interventions attempted: OTC medications: allergy tab, nasal spray.  Symptoms are: gradually worsening.  Triage Disposition: See PCP When Office is Open (Within 3 Days)  Patient/caregiver understands and will follow disposition?: Yes   Copied from CRM #[id number]. Topic: Clinical - Red Word Triage >> Oct 11, 2023  5:09 PM Jayma L wrote: Red Word that prompted transfer to Nurse Triage: Sinus issues - side of face and ear pain .SABRA Hurts when she tries to sleep , its on the right side , ear is draining , coughing up phlegm its very very white . Worse pain when she puts in dentures. Might be a infection - hurts to breath as well Reason for Disposition  [1] Sinus congestion (pressure, fullness) AND [2] present > 10 days  Answer Assessment - Initial Assessment Questions 1. LOCATION: Where does it hurt?      Right side of face/head. Increased pain with chewing/denture wear 2. ONSET: When did the sinus pain start?  (e.g., hours, days)      One week ago 3. SEVERITY: How bad is the pain?   (Scale 0-10; or none, mild, moderate or severe)     Pain keeps her up at night, cannot sleep on right side 4. RECURRENT SYMPTOM: Have you ever had sinus problems before? If Yes, ask: When was the last time? and What happened that time?      History of sinus troubles, never this bad.  Does use flonase  and allergy tablet.  Has given a little relief, but pain is worse 5. NASAL CONGESTION: Is the nose blocked? If Yes, ask: Can you open it or must you breathe through your mouth?     Right nostril runny 6. NASAL DISCHARGE: Do you have discharge from your nose? If so ask, What color?     clear 7. FEVER: Do you have a fever? If Yes, ask: What is it, how was it measured, and when did it start?       denies 8. OTHER SYMPTOMS: Do you have any other symptoms? (e.g., sore throat, cough, earache, difficulty breathing)     Denies cough  Protocols used: Sinus Pain or Congestion-A-AH

## 2023-10-12 ENCOUNTER — Ambulatory Visit (INDEPENDENT_AMBULATORY_CARE_PROVIDER_SITE_OTHER): Admitting: Family Medicine

## 2023-10-12 ENCOUNTER — Encounter: Payer: Self-pay | Admitting: Family Medicine

## 2023-10-12 VITALS — BP 118/78 | HR 66 | Temp 98.6°F | Ht 63.0 in | Wt 142.0 lb

## 2023-10-12 DIAGNOSIS — J309 Allergic rhinitis, unspecified: Secondary | ICD-10-CM | POA: Diagnosis not present

## 2023-10-12 DIAGNOSIS — H6991 Unspecified Eustachian tube disorder, right ear: Secondary | ICD-10-CM

## 2023-10-12 MED ORDER — PREDNISONE 10 MG PO TABS: 10.0000 mg | ORAL_TABLET | Freq: Two times a day (BID) | ORAL | 0 refills | Status: AC

## 2023-10-12 NOTE — Telephone Encounter (Signed)
 Pt has appt 10/12/23 to be seen for symptoms.

## 2023-10-12 NOTE — Progress Notes (Signed)
 Established Patient Office Visit   Subjective:  Patient ID: Betty [name], female    DOB: Jan 20, 1946  Age: 78 y.o. MRN: [id number]  Chief Complaint  Patient presents with   Facial Pain    Sinus pain ans pressure x 3 months. Pt states she also have experienced ringing in her ears and headaches.     HPI Encounter Diagnoses  Name Primary?   Allergic rhinitis, unspecified seasonality, unspecified trigger Yes   ETD (Eustachian tube dysfunction), right    Ongoing issue with right ear congestion postnasal drip.  The drainage has been clear there has been no fever or chills.  Occasionally feels pressure in the right side of her face.  Denies cough or purulent rhinorrhea.  Has been using Flonase  intermittently, saline nose spray and an OTC allergy preparation.   Review of Systems  Constitutional: Negative.   HENT: Negative.    Eyes:  Negative for blurred vision, discharge and redness.  Respiratory: Negative.    Cardiovascular: Negative.   Gastrointestinal:  Negative for abdominal pain.  Genitourinary: Negative.   Musculoskeletal: Negative.  Negative for myalgias.  Skin:  Negative for rash.  Neurological:  Negative for tingling, loss of consciousness and weakness.  Endo/Heme/Allergies:  Negative for polydipsia.     Current Outpatient Medications:    predniSONE  (DELTASONE ) 10 MG tablet, Take 1 tablet (10 mg total) by mouth 2 (two) times daily with a meal for 7 days., Disp: 14 tablet, Rfl: 0   Ascorbic Acid  (VITAMIN C ) 500 MG CAPS, Take 500 capsules by mouth daily., Disp: , Rfl:    aspirin 81 MG EC tablet, Take 81 mg by mouth daily. Swallow whole. (Patient not taking: Reported on 05/18/2023), Disp: , Rfl:    b complex vitamins capsule, Take 1 capsule by mouth daily., Disp: , Rfl:    carboxymethylcellulose (REFRESH PLUS) 0.5 % SOLN, 1 drop 3 (three) times daily as needed., Disp: , Rfl:    cholecalciferol (VITAMIN D3) 25 MCG (1000 UNIT) tablet, Take 1,000 Units by mouth daily., Disp: ,  Rfl:    cyclobenzaprine  (FLEXERIL ) 5 MG tablet, Take 1 tablet (5 mg total) by mouth at bedtime as needed for muscle spasms., Disp: 10 tablet, Rfl: 3   fluticasone  (FLONASE ) 50 MCG/ACT nasal spray, Place 1 spray into both nostrils daily., Disp: 16 g, Rfl: 6   lidocaine  (LIDODERM ) 5 %, Place 1 patch onto the skin daily. Remove & Discard patch within 12 hours or as directed by MD, Disp: 10 patch, Rfl: 0   LORATADINE PO, Take by mouth., Disp: , Rfl:    Multiple Vitamin (MULTIVITAMIN WITH MINERALS) TABS tablet, Take 1 tablet by mouth daily., Disp: , Rfl:    naproxen  (NAPROSYN ) 500 MG tablet, TAKE 1 TABLET BY MOUTH TWICE DAILY WITH A MEAL, Disp: 28 tablet, Rfl: 0   omeprazole  (PRILOSEC) 20 MG capsule, Take 20 mg by mouth daily as needed., Disp: , Rfl:    pravastatin  (PRAVACHOL ) 20 MG tablet, Take 1 tablet (20 mg total) by mouth daily., Disp: 90 tablet, Rfl: 1   vitamin B-12 (CYANOCOBALAMIN ) 100 MCG tablet, Take 100 mcg by mouth every other day., Disp: , Rfl:    vitamin k 100 MCG tablet, Take 100 mcg by mouth daily., Disp: , Rfl:    Objective:     BP 118/78 (BP Location: Left Arm, Patient Position: Sitting, Cuff Size: Normal)   Pulse 66   Temp 98.6 F (37 C) (Temporal)   Ht 5' 3 (1.6 m)   Wt  142 lb (64.4 kg)   SpO2 96%   BMI 25.15 kg/m    Physical Exam Constitutional:      General: She is not in acute distress.    Appearance: Normal appearance. She is not ill-appearing, toxic-appearing or diaphoretic.  HENT:     Head: Normocephalic and atraumatic.     Right Ear: Tympanic membrane, ear canal and external ear normal.     Left Ear: Tympanic membrane, ear canal and external ear normal.     Mouth/Throat:     Mouth: Mucous membranes are moist.     Pharynx: Oropharynx is clear. No oropharyngeal exudate or posterior oropharyngeal erythema.  Eyes:     General: No scleral icterus.       Right eye: No discharge.        Left eye: No discharge.     Extraocular Movements: Extraocular movements  intact.     Conjunctiva/sclera: Conjunctivae normal.     Pupils: Pupils are equal, round, and reactive to light.  Cardiovascular:     Rate and Rhythm: Normal rate and regular rhythm.  Pulmonary:     Effort: Pulmonary effort is normal. No respiratory distress.     Breath sounds: Normal breath sounds. No wheezing or rales.  Musculoskeletal:     Cervical back: No rigidity or tenderness.  Lymphadenopathy:     Cervical: No cervical adenopathy.  Skin:    General: Skin is warm and dry.  Neurological:     Mental Status: She is alert and oriented to person, place, and time.  Psychiatric:        Mood and Affect: Mood normal.        Behavior: Behavior normal.      No results found for any visits on 10/12/23.    The 10-year ASCVD risk score (Arnett DK, et al., 2019) is: 34.6%    Assessment & Plan:   Allergic rhinitis, unspecified seasonality, unspecified trigger -     predniSONE ; Take 1 tablet (10 mg total) by mouth 2 (two) times daily with a meal for 7 days.  Dispense: 14 tablet; Refill: 0  ETD (Eustachian tube dysfunction), right -     predniSONE ; Take 1 tablet (10 mg total) by mouth 2 (two) times daily with a meal for 7 days.  Dispense: 14 tablet; Refill: 0    Return Use Flonase  daily and schedule follow-up with Dr. Thedora.  Short burst of prednisone .  Encouraged her to use the Flonase  daily.  Recommended follow-up with her primary.  Elsie Sim Lent, MD

## 2023-10-18 ENCOUNTER — Other Ambulatory Visit: Payer: Self-pay

## 2023-10-18 DIAGNOSIS — G8929 Other chronic pain: Secondary | ICD-10-CM

## 2023-10-19 ENCOUNTER — Ambulatory Visit: Admitting: Orthopaedic Surgery

## 2023-10-19 DIAGNOSIS — M1711 Unilateral primary osteoarthritis, right knee: Secondary | ICD-10-CM

## 2023-10-19 MED ORDER — HYALURONAN 88 MG/4ML IX SOSY
88.0000 mg | PREFILLED_SYRINGE | INTRA_ARTICULAR | Status: AC | PRN
  Administered 2023-10-19: 88 mg via INTRA_ARTICULAR

## 2023-10-19 NOTE — Progress Notes (Signed)
   Procedure Note  Patient: Betty [name]             Date of Birth: 06-Mar-1945           MRN: [id number]             Visit Date: 10/19/2023  Procedures: Visit Diagnoses:  1. Primary osteoarthritis of right knee     Large Joint Inj: R knee on 10/19/2023 8:40 AM Indications: pain Details: 22 G needle  Arthrogram: No  Medications: 88 mg Hyaluronan 88 MG/4ML Outcome: tolerated well, no immediate complications Patient was prepped and draped in the usual sterile fashion.

## 2023-10-20 ENCOUNTER — Other Ambulatory Visit: Payer: Self-pay | Admitting: Family Medicine

## 2023-10-20 DIAGNOSIS — H6991 Unspecified Eustachian tube disorder, right ear: Secondary | ICD-10-CM

## 2023-10-20 DIAGNOSIS — J309 Allergic rhinitis, unspecified: Secondary | ICD-10-CM

## 2023-10-21 NOTE — Telephone Encounter (Signed)
 Called patient and left a detailed voice message per DPR on file of Dr. Baldomero comment Do not recommend this be renewed without being seen for evaluation. Asked to give the office a call at [phone] if she is still not feeling well.

## 2023-10-23 ENCOUNTER — Other Ambulatory Visit: Payer: Self-pay | Admitting: Family Medicine

## 2023-10-23 DIAGNOSIS — E782 Mixed hyperlipidemia: Secondary | ICD-10-CM

## 2023-10-26 NOTE — Telephone Encounter (Signed)
 Appointment scheduled for 11/16/23. Dm/cma

## 2023-11-16 ENCOUNTER — Encounter: Payer: Self-pay | Admitting: Family Medicine

## 2023-11-16 ENCOUNTER — Ambulatory Visit: Payer: Self-pay | Admitting: Family Medicine

## 2023-11-16 ENCOUNTER — Ambulatory Visit (INDEPENDENT_AMBULATORY_CARE_PROVIDER_SITE_OTHER): Admitting: Family Medicine

## 2023-11-16 VITALS — BP 124/82 | HR 68 | Temp 97.6°F | Ht 63.0 in | Wt 143.2 lb

## 2023-11-16 DIAGNOSIS — K219 Gastro-esophageal reflux disease without esophagitis: Secondary | ICD-10-CM

## 2023-11-16 DIAGNOSIS — E782 Mixed hyperlipidemia: Secondary | ICD-10-CM

## 2023-11-16 DIAGNOSIS — R7303 Prediabetes: Secondary | ICD-10-CM | POA: Diagnosis not present

## 2023-11-16 LAB — LIPID PANEL
Cholesterol: 222 mg/dL — ABNORMAL HIGH (ref 0–200)
HDL: 49.7 mg/dL (ref >39.00)
LDL Cholesterol: 109 mg/dL — ABNORMAL HIGH (ref 0–99)
NonHDL: 172.19
Total CHOL/HDL Ratio: 4
Triglycerides: 316 mg/dL — ABNORMAL HIGH (ref 0.0–149.0)
VLDL: 63.2 mg/dL — ABNORMAL HIGH (ref 0.0–40.0)

## 2023-11-16 LAB — COMPREHENSIVE METABOLIC PANEL WITH GFR
ALT: 17 U/L (ref 0–35)
AST: 23 U/L (ref 0–37)
Albumin: 4.3 g/dL (ref 3.5–5.2)
Alkaline Phosphatase: 54 U/L (ref 39–117)
BUN: 9 mg/dL (ref 6–23)
CO2: 30 meq/L (ref 19–32)
Calcium: 10 mg/dL (ref 8.4–10.5)
Chloride: 104 meq/L (ref 96–112)
Creatinine, Ser: 0.91 mg/dL (ref 0.40–1.20)
GFR: 59.83 mL/min — ABNORMAL LOW (ref >60.00)
Glucose, Bld: 77 mg/dL (ref 70–99)
Potassium: 4.1 meq/L (ref 3.5–5.1)
Sodium: 143 meq/L (ref 135–145)
Total Bilirubin: 0.7 mg/dL (ref 0.2–1.2)
Total Protein: 7 g/dL (ref 6.0–8.3)

## 2023-11-16 LAB — HEMOGLOBIN A1C: Hgb A1c MFr Bld: 6.2 % (ref 4.6–6.5)

## 2023-11-16 MED ORDER — OMEPRAZOLE 20 MG PO CPDR: 20.0000 mg | DELAYED_RELEASE_CAPSULE | Freq: Every day | ORAL | 1 refills | Status: DC | PRN

## 2023-11-16 MED ORDER — PRAVASTATIN SODIUM 40 MG PO TABS: 40.0000 mg | ORAL_TABLET | Freq: Every day | ORAL | 3 refills | Status: AC

## 2023-11-16 NOTE — Progress Notes (Signed)
 [facility] PRIMARY CARE [facility] [address] [place] [address] Dept: [phone] Dept Fax: [phone]  Chronic Care Office Visit  Subjective:    Patient ID: Betty [name], female    DOB: 1945-05-27, 78 y.o..   MRN: [id number]  Chief Complaint  Patient presents with   Follow-up    F/u meds.  Not fasting today.   No concerns. Declines flu shot today.    History of Present Illness:  Patient is in today for reassessment of chronic medical issues.  Ms. Rease has a history of hyperlipidemia. She is managed on pravastatin  20 mg daily.  Ms. Sozio has a history of GERD. She has noted some increased symptoms over the past few weeks. She had been on omeprazole  in the past, but stopped this 4-5 months ago. she is now using intermittent antacids (similar to Maalox).  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   Gastroesophageal reflux disease 10/21/2018   Hyperlipidemia 10/21/2018   History of pulmonary embolus (PE) - Dx'd 05/2018  06/21/2018   Past Surgical History:  Procedure Laterality Date   CESAREAN SECTION     EYE SURGERY     LAPAROSCOPIC APPENDECTOMY N/A 07/06/2019   Procedure: APPENDECTOMY LAPAROSCOPIC, TAP BLOCK;  Surgeon: Sheldon Standing, MD;  Location: [facility];  Service: General;  Laterality: N/A;   Family History  Problem Relation Age of Onset   Hypertension Mother    Hypertension Father    Alzheimer's disease Father     Cancer Daughter        Breast   Cancer Paternal Aunt    Heart disease Paternal Grandmother    Outpatient Medications Prior to Visit  Medication Sig Dispense Refill   Ascorbic Acid  (VITAMIN C ) 500 MG CAPS Take 500 capsules by mouth daily.     b complex vitamins capsule Take 1 capsule by mouth daily.     carboxymethylcellulose (REFRESH PLUS) 0.5 % SOLN 1 drop 3 (three) times daily as needed.     cholecalciferol (VITAMIN D3) 25 MCG (1000 UNIT) tablet Take 1,000 Units by mouth daily.     cyclobenzaprine  (FLEXERIL ) 5 MG tablet Take 1 tablet (5 mg total) by mouth at bedtime as needed for muscle spasms. 10 tablet 3   fluticasone  (FLONASE ) 50 MCG/ACT nasal spray Place 1 spray into both nostrils daily. 16 g 6   LORATADINE PO Take by mouth.     Multiple Vitamin (MULTIVITAMIN WITH MINERALS) TABS tablet Take 1 tablet by mouth daily.     pravastatin  (PRAVACHOL ) 20 MG tablet TAKE 1 TABLET EVERY DAY 90 tablet 3   vitamin B-12 (CYANOCOBALAMIN ) 100 MCG tablet Take 100 mcg by mouth every other day.     vitamin k 100 MCG tablet Take 100 mcg by mouth daily.     naproxen  (NAPROSYN ) 500 MG tablet TAKE 1 TABLET BY MOUTH TWICE DAILY WITH A MEAL 28 tablet 0   omeprazole  (PRILOSEC) 20 MG capsule Take 20 mg by mouth daily as needed.     lidocaine  (  LIDODERM ) 5 % Place 1 patch onto the skin daily. Remove & Discard patch within 12 hours or as directed by MD (Patient not taking: Reported on 11/16/2023) 10 patch 0   aspirin 81 MG EC tablet Take 81 mg by mouth daily. Swallow whole. (Patient not taking: Reported on 05/18/2023)     No facility-administered medications prior to visit.   Allergies  Allergen Reactions   Sulfa Antibiotics Other (See Comments)    bristers   Objective:   Today's Vitals   11/16/23 1002  BP: 124/82  Pulse: 68  Temp: 97.6 F (36.4 C)  TempSrc: Temporal  Weight: 143 lb 3.2 oz (65 kg)  Height: 5' 3 (1.6 m)   Body mass index is 25.37 kg/m.   General: Well developed, well nourished.  No acute distress. Psych: Alert and oriented. Normal mood and affect.  Health Maintenance Due  Topic Date Due   DTaP/Tdap/Td (1 - Tdap) Never done   Pneumococcal Vaccine: 50+ Years (1 of 2 - PCV) Never done   Zoster Vaccines- Shingrix (1 of 2) Never done   HEMOGLOBIN A1C  10/07/2022   Medicare Annual Wellness (AWV)  07/27/2023     Assessment & Plan:   Problem List Items Addressed This Visit       Digestive   Gastroesophageal reflux disease   I recommend Ms. Nham go back on a PPI for about 1 month and then try off of this again. I will renew her omeprazole  20 mg daily.      Relevant Medications   omeprazole  (PRILOSEC) 20 MG capsule     Other   Hyperlipidemia - Primary   I will check lipids today. Continue pravastatin  20 mg daily.      Relevant Orders   Lipid panel   Comprehensive metabolic panel with GFR   Prediabetes   I will reassess her A1c today.      Relevant Orders   Comprehensive metabolic panel with GFR   Hemoglobin A1c    Return in about 1 year (around 11/15/2024) for Annual preventative care.   Garnette CHRISTELLA Simpler, MD

## 2023-11-16 NOTE — Assessment & Plan Note (Signed)
 I recommend Ms. Pulido go back on a PPI for about 1 month and then try off of this again. I will renew her omeprazole  20 mg daily.

## 2023-11-16 NOTE — Assessment & Plan Note (Signed)
 I will check lipids today. Continue pravastatin 20 mg daily.

## 2023-11-16 NOTE — Assessment & Plan Note (Signed)
I will reassess her A1c today. 

## 2023-11-20 IMAGING — CR DG KNEE COMPLETE 4+V*L*
4 series · 4 of 4 positions shown · non-contrast
Comparison: 02/28/2020.

CLINICAL DATA: Patellar dislocation.

EXAM:
LEFT KNEE - COMPLETE 4+ VIEW

[t knee ap left]
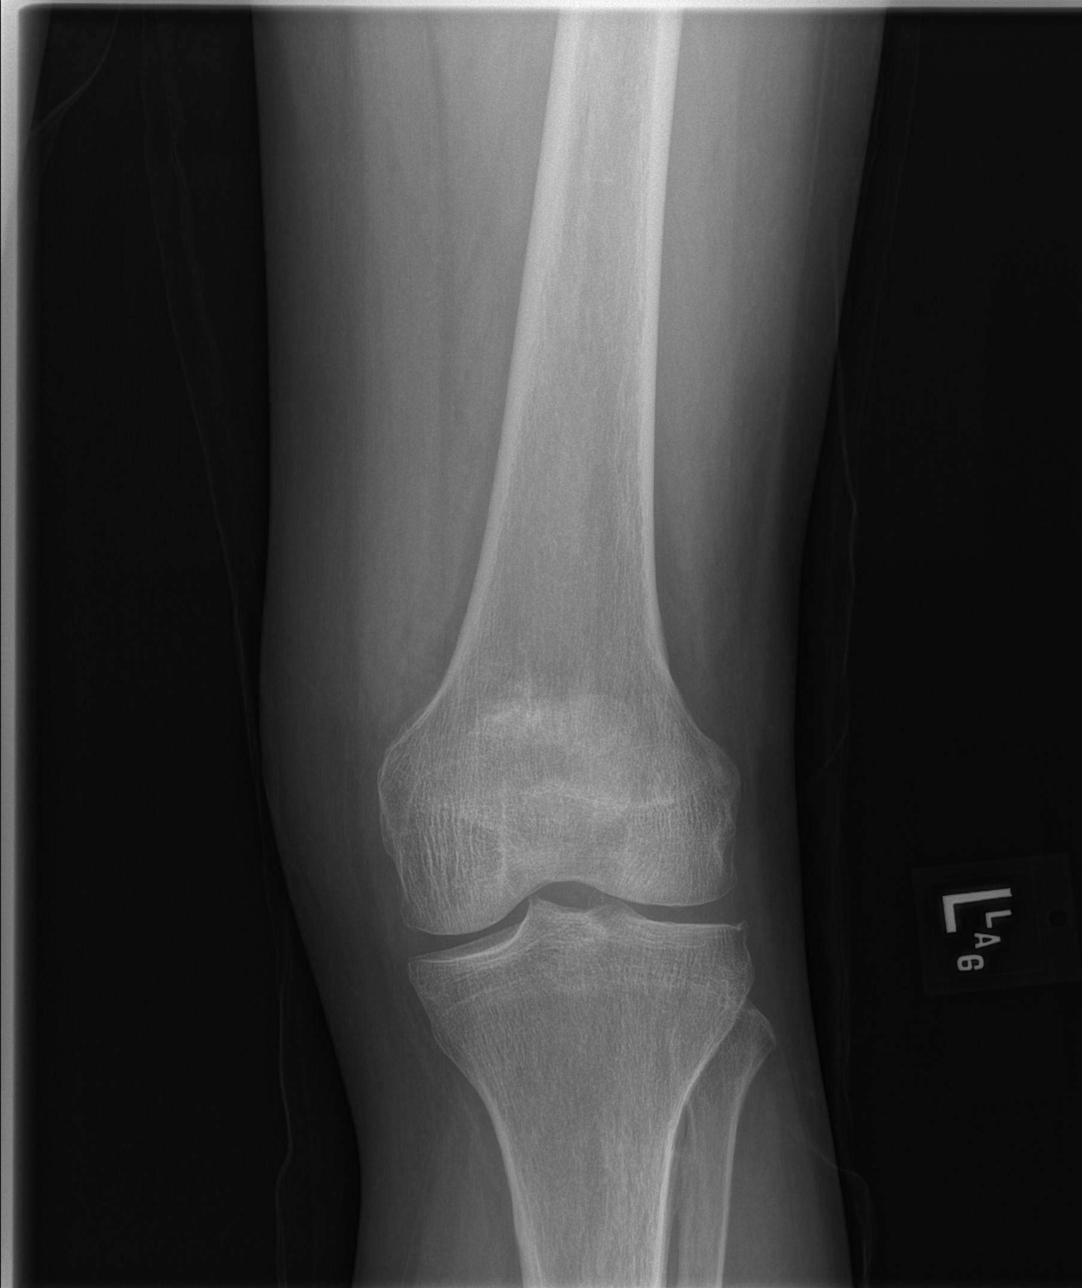

[t knee obl left (1 of 2)]
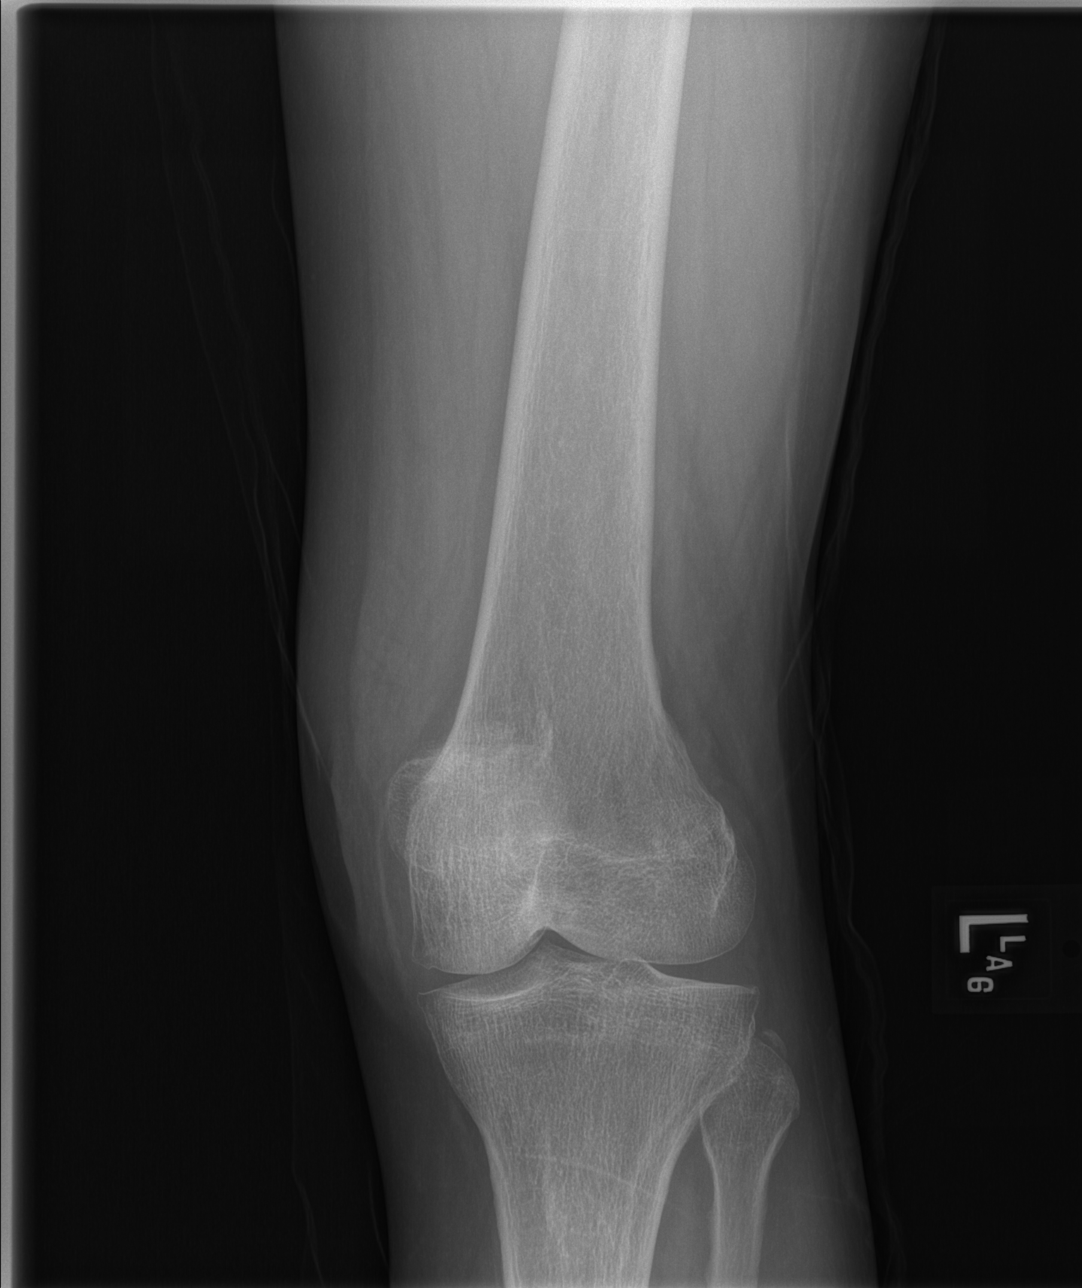

[t knee obl left (2 of 2)]
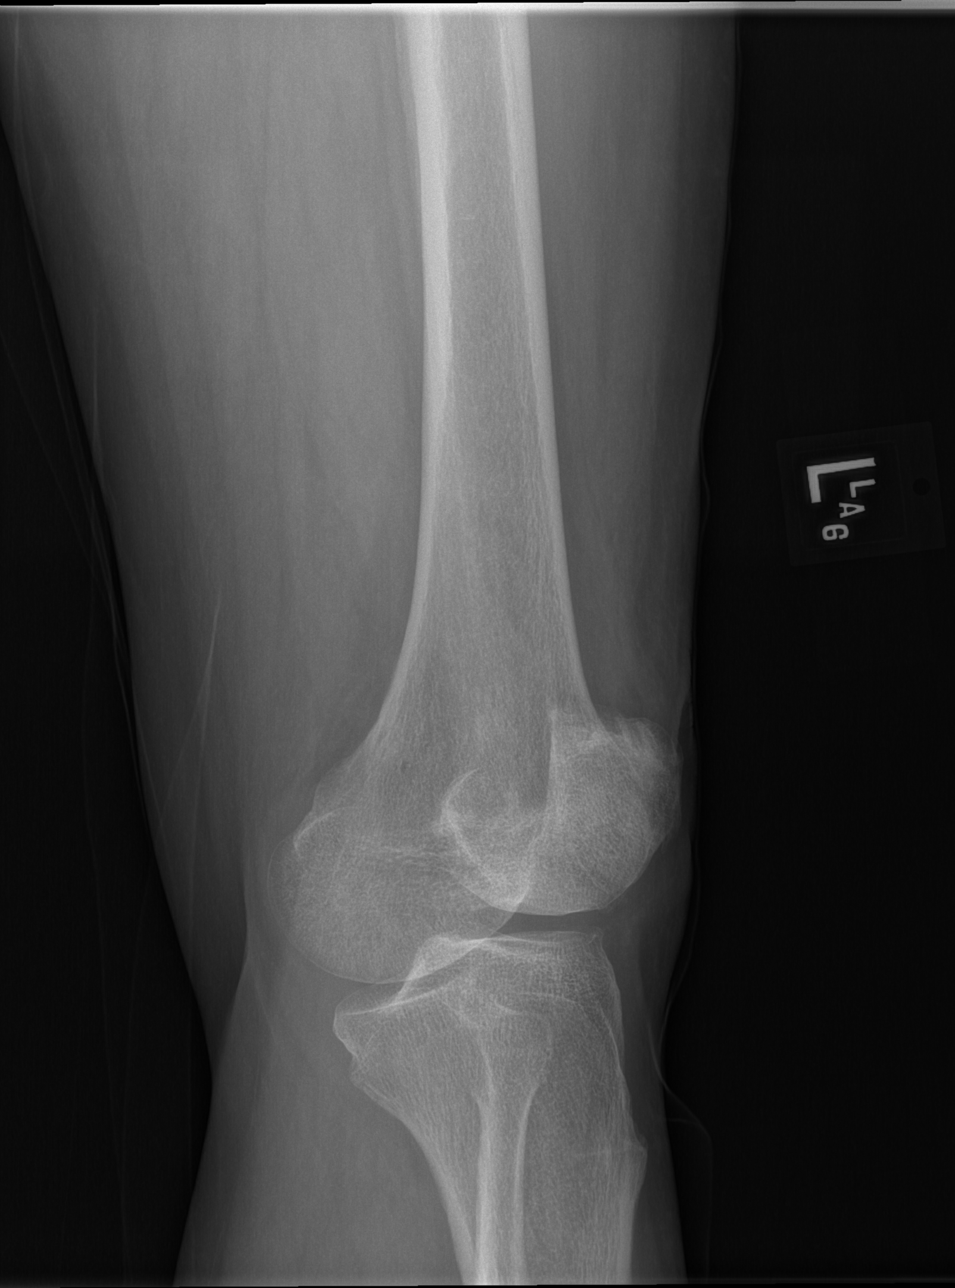

[t knee lat left]
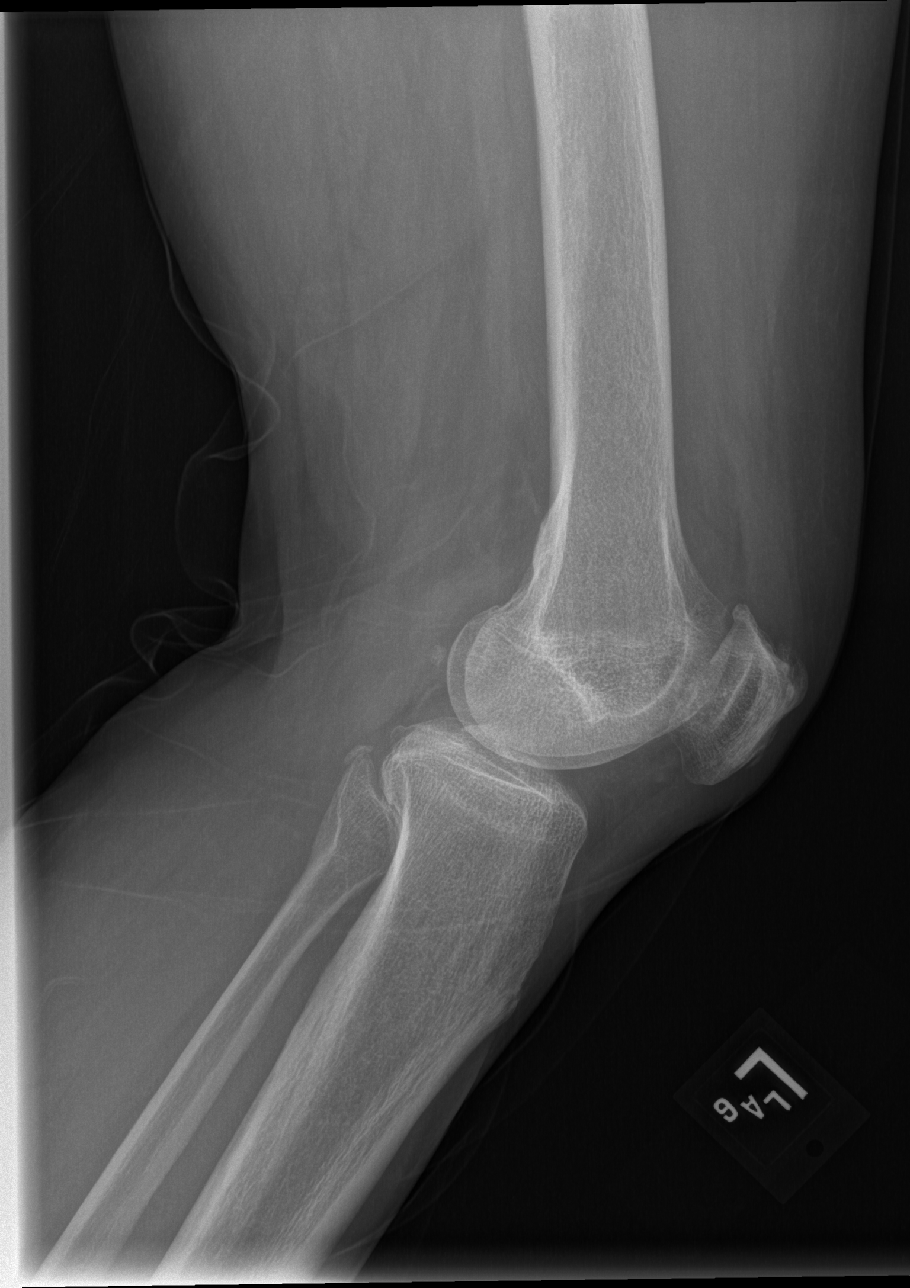

[4 of 4 positions shown; findings below may reference images not displayed]

FINDINGS: No acute fracture or dislocation. No joint effusion. There are mild
degenerative changes in the mediolateral compartments and moderate
degenerative changes in the patellofemoral compartment.
Enthesopathic changes are present at the patella. The soft tissues
are within normal limits.
IMPRESSION: 1. No acute fracture or dislocation.
2. Mild-to-moderate degenerative changes, most pronounced in the
patellofemoral compartment.

## 2023-12-01 ENCOUNTER — Ambulatory Visit (INDEPENDENT_AMBULATORY_CARE_PROVIDER_SITE_OTHER): Admitting: Nurse Practitioner

## 2023-12-01 ENCOUNTER — Other Ambulatory Visit: Payer: Self-pay | Admitting: Nurse Practitioner

## 2023-12-01 ENCOUNTER — Encounter: Payer: Self-pay | Admitting: Nurse Practitioner

## 2023-12-01 VITALS — BP 124/72 | HR 87 | Temp 97.7°F | Ht 63.0 in | Wt 142.8 lb

## 2023-12-01 DIAGNOSIS — S161XXA Strain of muscle, fascia and tendon at neck level, initial encounter: Secondary | ICD-10-CM | POA: Diagnosis not present

## 2023-12-01 MED ORDER — IBUPROFEN 200 MG PO TABS: 400.0000 mg | ORAL_TABLET | Freq: Three times a day (TID) | ORAL | Status: DC | PRN

## 2023-12-01 MED ORDER — CYCLOBENZAPRINE HCL 5 MG PO TABS: 5.0000 mg | ORAL_TABLET | Freq: Every evening | ORAL | 0 refills | Status: AC | PRN

## 2023-12-01 MED ORDER — NAPROXEN 500 MG PO TABS: 500.0000 mg | ORAL_TABLET | Freq: Two times a day (BID) | ORAL | 0 refills | Status: AC

## 2023-12-01 MED ORDER — NAPROXEN 500 MG PO TABS: 500.0000 mg | ORAL_TABLET | Freq: Two times a day (BID) | ORAL | Status: DC

## 2023-12-01 NOTE — Patient Instructions (Addendum)
 Continue aleve  500mg  every 12hrs x 3days, then as needed. Take with food Take flexeril  ab bedtime Change pillow to provide proper neck support at bedtime. Ok to use lidocaine  patch as directed on package.  Neck Exercises Ask your health care provider which exercises are safe for you. Do exercises exactly as told by your health care provider and adjust them as directed. It is normal to feel mild stretching, pulling, tightness, or discomfort as you do these exercises. Stop right away if you feel sudden pain or your pain gets worse. Do not begin these exercises until told by your health care provider. Neck exercises can be important for many reasons. They can improve strength and maintain flexibility in your neck, which will help your upper back and prevent neck pain. Stretching exercises Rotation neck stretching  Sit in a chair or stand up. Place your feet flat on the floor, shoulder-width apart. Slowly turn your head (rotate) to the right until a slight stretch is felt. Turn it all the way to the right so you can look over your right shoulder. Do not tilt or tip your head. Hold this position for 10-30 seconds. Slowly turn your head (rotate) to the left until a slight stretch is felt. Turn it all the way to the left so you can look over your left shoulder. Do not tilt or tip your head. Hold this position for 10-30 seconds. Repeat __________ times. Complete this exercise __________ times a day. Neck retraction  Sit in a sturdy chair or stand up. Look straight ahead. Do not bend your neck. Use your fingers to push your chin backward (retraction). Do not bend your neck for this movement. Continue to face straight ahead. If you are doing the exercise properly, you will feel a slight sensation in your throat and a stretch at the back of your neck. Hold the stretch for 1-2 seconds. Repeat __________ times. Complete this exercise __________ times a day. Strengthening exercises Neck press  Lie on  your back on a firm bed or on the floor with a pillow under your head. Use your neck muscles to push your head down on the pillow and straighten your spine. Hold the position as well as you can. Keep your head facing up (in a neutral position) and your chin tucked. Slowly count to 5 while holding this position. Repeat __________ times. Complete this exercise __________ times a day. Isometrics These are exercises in which you strengthen the muscles in your neck while keeping your neck still (isometrics). Sit in a supportive chair and place your hand on your forehead. Keep your head and face facing straight ahead. Do not flex or extend your neck while doing isometrics. Push forward with your head and neck while pushing back with your hand. Hold for 10 seconds. Do the sequence again, this time putting your hand against the back of your head. Use your head and neck to push backward against the hand pressure. Finally, do the same exercise on either side of your head, pushing sideways against the pressure of your hand. Repeat __________ times. Complete this exercise __________ times a day. Prone head lifts  Lie face-down (prone position), resting on your elbows so that your chest and upper back are raised. Start with your head facing downward, near your chest. Position your chin either on or near your chest. Slowly lift your head upward. Lift until you are looking straight ahead. Then continue lifting your head as far back as you can comfortably stretch. Hold your  head up for 5 seconds. Then slowly lower it to your starting position. Repeat __________ times. Complete this exercise __________ times a day. Supine head lifts  Lie on your back (supine position), bending your knees to point to the ceiling and keeping your feet flat on the floor. Lift your head slowly off the floor, raising your chin toward your chest. Hold for 5 seconds. Repeat __________ times. Complete this exercise __________ times  a day. Scapular retraction  Stand with your arms at your sides. Look straight ahead. Slowly pull both shoulders (scapulae) backward and downward (retraction) until you feel a stretch between your shoulder blades in your upper back. Hold for 10-30 seconds. Relax and repeat. Repeat __________ times. Complete this exercise __________ times a day. Contact a health care provider if: Your neck pain or discomfort gets worse when you do an exercise. Your neck pain or discomfort does not improve within 2 hours after you exercise. If you have any of these problems, stop exercising right away. Do not do the exercises again unless your health care provider says that you can. Get help right away if: You develop sudden, severe neck pain. If this happens, stop exercising right away. Do not do the exercises again unless your health care provider says that you can. This information is not intended to replace advice given to you by your health care provider. Make sure you discuss any questions you have with your health care provider. Document Revised: 08/06/2020 Document Reviewed: 08/06/2020 Elsevier Patient Education  2024 [employer].

## 2023-12-01 NOTE — Progress Notes (Signed)
 Acute Office Visit  Subjective:    Patient ID: Betty [name], female    DOB: 1945/10/05, 78 y.o.   MRN: [id number]  Chief Complaint  Patient presents with   Neck Pain    Pt state she is having some pain on the right side of her neck. States the pain has been radiating from her neck to her shoulder, Pt state she had the crook in her neck for 5 days  states, 3 days ago she had a fever and headaches   Neck Pain  This is a new problem. The current episode started in the past 7 days. The problem occurs constantly. The problem has been gradually improving. The pain is associated with a sleep position. The pain is present in the right side. The quality of the pain is described as cramping. The symptoms are aggravated by twisting. The pain is Same all the time. Pertinent negatives include no chest pain, fever, headaches, leg pain, numbness, pain with swallowing, paresis, photophobia, syncope, tingling, trouble swallowing, visual change, weakness or weight loss. She has tried NSAIDs for the symptoms. The treatment provided moderate relief.  No URI symptoms, no sore throat Reports low grade fever x 3days  Outpatient Medications Prior to Visit  Medication Sig   Ascorbic Acid  (VITAMIN C ) 500 MG CAPS Take 500 capsules by mouth daily.   b complex vitamins capsule Take 1 capsule by mouth daily.   carboxymethylcellulose (REFRESH PLUS) 0.5 % SOLN 1 drop 3 (three) times daily as needed.   cholecalciferol (VITAMIN D3) 25 MCG (1000 UNIT) tablet Take 1,000 Units by mouth daily.   fluticasone  (FLONASE ) 50 MCG/ACT nasal spray Place 1 spray into both nostrils daily.   lidocaine  (LIDODERM ) 5 % Place 1 patch onto the skin daily. Remove & Discard patch within 12 hours or as directed by MD   LORATADINE PO Take by mouth.   Multiple Vitamin (MULTIVITAMIN WITH MINERALS) TABS tablet Take 1 tablet by mouth daily.   omeprazole  (PRILOSEC) 20 MG capsule Take 1 capsule (20 mg total) by mouth daily as needed.   pravastatin   (PRAVACHOL ) 40 MG tablet Take 1 tablet (40 mg total) by mouth daily.   vitamin B-12 (CYANOCOBALAMIN ) 100 MCG tablet Take 100 mcg by mouth every other day.   vitamin k 100 MCG tablet Take 100 mcg by mouth daily.   [DISCONTINUED] cyclobenzaprine  (FLEXERIL ) 5 MG tablet Take 1 tablet (5 mg total) by mouth at bedtime as needed for muscle spasms.   No facility-administered medications prior to visit.    Reviewed past medical and social history.  Review of Systems  Constitutional:  Negative for fever and weight loss.  HENT:  Negative for trouble swallowing.   Eyes:  Negative for photophobia.  Cardiovascular:  Negative for chest pain and syncope.  Musculoskeletal:  Positive for neck pain.  Neurological:  Negative for tingling, weakness, numbness and headaches.   Per HPI     Objective:    Physical Exam Vitals and nursing note reviewed.  Cardiovascular:     Rate and Rhythm: Normal rate.     Pulses: Normal pulses.  Pulmonary:     Effort: Pulmonary effort is normal.  Musculoskeletal:     Right shoulder: Tenderness present. No swelling, deformity, effusion, laceration, bony tenderness or crepitus. Decreased range of motion. Normal strength. Normal pulse.     Left shoulder: Normal.     Right upper arm: Normal.     Left upper arm: Normal.     Cervical back: Normal range  of motion and neck supple. Tenderness present. No rigidity or crepitus. Pain with movement present. Normal range of motion.     Thoracic back: Normal.  Lymphadenopathy:     Cervical: No cervical adenopathy.  Skin:    General: Skin is warm and dry.     Findings: No rash.  Neurological:     Mental Status: She is oriented to person, place, and time.  Psychiatric:        Mood and Affect: Mood normal.        Behavior: Behavior normal.    BP 124/72 (BP Location: Left Arm, Patient Position: Sitting, Cuff Size: Normal)   Pulse 87   Temp 97.7 F (36.5 C) (Temporal)   Ht 5' 3 (1.6 m)   Wt 142 lb 12.8 oz (64.8 kg)   SpO2  93%   BMI 25.30 kg/m    No results found for any visits on 12/01/23.     Assessment & Plan:   Problem List Items Addressed This Visit   None Visit Diagnoses       Cervical muscle strain, initial encounter    -  Primary   Relevant Medications   cyclobenzaprine  (FLEXERIL ) 5 MG tablet   naproxen  (NAPROSYN ) 500 MG tablet      Meds ordered this encounter  Medications   DISCONTD: ibuprofen (ADVIL) 200 MG tablet    Sig: Take 2 tablets (400 mg total) by mouth every 8 (eight) hours as needed.    Supervising Provider:   BERNETA FALLOW ALFRED [[id number]]   cyclobenzaprine  (FLEXERIL ) 5 MG tablet    Sig: Take 1 tablet (5 mg total) by mouth at bedtime as needed for muscle spasms.    Dispense:  10 tablet    Refill:  0    Supervising Provider:   BERNETA FALLOW ALFRED [[id number]]   DISCONTD: naproxen  (NAPROSYN ) 500 MG tablet    Sig: Take 1 tablet (500 mg total) by mouth 2 (two) times daily with a meal.    Supervising Provider:   BERNETA FALLOW ALFRED [[id number]]   naproxen  (NAPROSYN ) 500 MG tablet    Sig: Take 1 tablet (500 mg total) by mouth 2 (two) times daily with a meal.    Dispense:  14 tablet    Refill:  0    Supervising Provider:   BERNETA FALLOW SAYRE [[id number]]   Return if symptoms worsen or fail to improve.  Roselie Mood, NP

## 2023-12-24 ENCOUNTER — Ambulatory Visit

## 2023-12-27 ENCOUNTER — Encounter: Payer: Self-pay | Admitting: Radiology

## 2023-12-29 ENCOUNTER — Other Ambulatory Visit: Payer: Self-pay | Admitting: Nurse Practitioner

## 2023-12-29 DIAGNOSIS — S161XXA Strain of muscle, fascia and tendon at neck level, initial encounter: Secondary | ICD-10-CM

## 2023-12-31 ENCOUNTER — Ambulatory Visit: Payer: Self-pay

## 2023-12-31 NOTE — Telephone Encounter (Signed)
 Called and advised that she can buy Aleve  OTC and take it till her appointment on 01/05/24. Dm/cma

## 2023-12-31 NOTE — Telephone Encounter (Signed)
 FYI Only or Action Required?: FYI only for provider: 01/05/2024.  Patient was last seen in primary care on 12/01/2023 by Nche, Roselie Rockford, NP.  Called Nurse Triage reporting Shoulder Pain.  Symptoms began several weeks ago.  Interventions attempted: Prescription medications: naproxyn.  Symptoms are: unchanged.  Triage Disposition: See PCP When Office is Open (Within 3 Days)  Patient/caregiver understands and will follow disposition?: Yes   Copied from CRM [id number]. Topic: Clinical - Red Word Triage >> Dec 31, 2023 10:24 AM Betty [name] wrote: Betty [name] that prompted transfer to Nurse Triage: was seen for pain in shoulder and neck about a month ago was prescribed naproxen  (NAPROSYN ) 500 MG tablet wanted to know if she could get refill on that and also  still having the pain and wanting to know if she could get a xray Reason for Disposition  [1] MODERATE pain (e.g., interferes with normal activities) AND [2] present > 3 days  Answer Assessment - Initial Assessment Questions Patient seen on 10/8 for neck pain.  Today, continues to have neck pain as well as bilateral shoulder joint pain  1. ONSET: When did the pain start?     2-3 weeks 2. LOCATION: Where is the pain located?     Bilateral shoulder joint pain 3. PAIN: How bad is the pain? (Scale 1-10; or mild, moderate, severe)     Pain limits ROM, intermittent, at worse 8/10 4. WORK OR EXERCISE: Has there been any recent work or exercise that involved this part of the body?     denies 5. CAUSE: What do you think is causing the shoulder pain?     unknown 6. OTHER SYMPTOMS: Do you have any other symptoms? (e.g., neck pain, swelling, rash, fever, numbness, weakness)     Denies any radiation, numbness, or tingling  Protocols used: Shoulder Pain-A-AH

## 2024-01-05 ENCOUNTER — Ambulatory Visit (INDEPENDENT_AMBULATORY_CARE_PROVIDER_SITE_OTHER)

## 2024-01-05 ENCOUNTER — Ambulatory Visit (INDEPENDENT_AMBULATORY_CARE_PROVIDER_SITE_OTHER): Admitting: Family Medicine

## 2024-01-05 VITALS — BP 120/76 | HR 80 | Temp 98.2°F | Ht 63.0 in | Wt 145.0 lb

## 2024-01-05 DIAGNOSIS — Z1231 Encounter for screening mammogram for malignant neoplasm of breast: Secondary | ICD-10-CM | POA: Diagnosis not present

## 2024-01-05 DIAGNOSIS — M19012 Primary osteoarthritis, left shoulder: Secondary | ICD-10-CM | POA: Diagnosis not present

## 2024-01-05 DIAGNOSIS — M25512 Pain in left shoulder: Secondary | ICD-10-CM | POA: Diagnosis not present

## 2024-01-05 NOTE — Progress Notes (Signed)
 [facility] [address] [place] [address] Dept: [phone] Dept Fax: [phone]  Office Visit  Subjective:    Patient ID: Betty [name], female    DOB: 1945/11/16, 78 y.o..   MRN: [id number]  Chief Complaint  Patient presents with   Shoulder Pain    C/o having bilateral shoulder/neck pain x 2-3 weeks.  Has been using prescribed medications with no relief.    History of Present Illness:  Patient is in today for bilateral shoulder pain. Betty [name] was evaluated by Betty [name] about a month ago. At that time, she was primarily having pain in the right neck and shoulder. Her exam was consistent with a cervical muscle strain. Betty [name] continues to have this as an issue. However, she is now having pain and limited ROM of the left shoulder joint. She finds she is unable to raise that arm above shoulder level. She finds it is more painful if she lies on that side. She does not recall any specific injury.  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   Gastroesophageal reflux disease 10/21/2018   Hyperlipidemia 10/21/2018   History of pulmonary embolus (PE) - Dx'd 05/2018  06/21/2018   Past Surgical History:  Procedure Laterality Date   CESAREAN SECTION     EYE SURGERY     LAPAROSCOPIC APPENDECTOMY N/A 07/06/2019   Procedure: APPENDECTOMY LAPAROSCOPIC, TAP BLOCK;  Surgeon: Sheldon Standing, MD;  Location: [facility];   Service: General;  Laterality: N/A;   Family History  Problem Relation Age of Onset   Hypertension Mother    Hypertension Father    Alzheimer's disease Father    Cancer Daughter        Breast   Cancer Paternal Aunt    Heart disease Paternal Grandmother    Outpatient Medications Prior to Visit  Medication Sig Dispense Refill   Ascorbic Acid  (VITAMIN C ) 500 MG CAPS Take 500 capsules by mouth daily.     b complex vitamins capsule Take 1 capsule by mouth daily.     carboxymethylcellulose (REFRESH PLUS) 0.5 % SOLN 1 drop 3 (three) times daily as needed.     cholecalciferol (VITAMIN D3) 25 MCG (1000 UNIT) tablet Take 1,000 Units by mouth daily.     cyclobenzaprine  (FLEXERIL ) 5 MG tablet Take 1 tablet (5 mg total) by mouth at bedtime as needed for muscle spasms. 10 tablet 0   fluticasone  (FLONASE ) 50 MCG/ACT nasal spray Place 1 spray into both nostrils daily. 16 g 6   lidocaine  (LIDODERM ) 5 % Place 1 patch onto the skin daily. Remove & Discard patch within 12 hours or as directed by MD 10 patch 0   LORATADINE PO Take by mouth.     Multiple Vitamin (MULTIVITAMIN WITH MINERALS) TABS tablet Take 1 tablet by mouth daily.     naproxen  (NAPROSYN ) 500 MG tablet Take 1 tablet (500 mg total) by mouth 2 (two) times daily with a meal. 14 tablet 0   omeprazole  (PRILOSEC) 20  MG capsule Take 1 capsule (20 mg total) by mouth daily as needed. 30 capsule 1   pravastatin  (PRAVACHOL ) 40 MG tablet Take 1 tablet (40 mg total) by mouth daily. 90 tablet 3   vitamin B-12 (CYANOCOBALAMIN ) 100 MCG tablet Take 100 mcg by mouth every other day.     vitamin k 100 MCG tablet Take 100 mcg by mouth daily.     No facility-administered medications prior to visit.   Allergies  Allergen Reactions   Sulfa Antibiotics Other (See Comments)    bristers     Objective:   Today's Vitals   01/05/24 1414  BP: 120/76  Pulse: 80  Temp: 98.2 F (36.8 C)  TempSrc: Temporal  Weight: 145 lb (65.8 kg)  Height: 5' 3 (1.6 m)    Body mass index is 25.69 kg/m.   General: Well developed, well nourished. No acute distress. Neck: Supple. Mild discomfort over the right neck muscles, esp. the upper trapezius. Extremities: Limited ROM of the left shoulder with minor popping present. Pain on palpation along the   subacromial joint line. No pain over the [facility] joint. Empty can test shows only minor pain and no weakness.  Psych: Alert and oriented. Normal mood and affect.  Health Maintenance Due  Topic Date Due   DTaP/Tdap/Td (1 - Tdap) Never done   Zoster Vaccines- Shingrix (1 of 2) Never done   Medicare Annual Wellness (AWV)  07/27/2023   Imaging: Left shoulder x-ray- Mild flattening of the humeral head with minor bone spurs. No sing of dislocation.    Assessment & Plan:   Problem List Items Addressed This Visit   None Visit Diagnoses       Localized osteoarthritis of left shoulder    -  Primary   Suspect OA as primary cause of pain. Recommend use of heat or ice. I will refer to Dr. Jerri to consider an inatr-articular steroid injection.   Relevant Orders   DG Shoulder Left (Completed)   Ambulatory referral to Orthopedics     Encounter for screening mammogram for malignant neoplasm of breast       Relevant Orders   MM DIGITAL SCREENING BILATERAL       Return if symptoms worsen or fail to improve.    Garnette CHRISTELLA Simpler, MD  I,Emily Lagle,acting as a scribe for Garnette CHRISTELLA Simpler, MD.,have documented all relevant documentation on the behalf of Garnette CHRISTELLA Simpler, MD.  I, Garnette CHRISTELLA Simpler, MD, have reviewed all documentation for this visit. The documentation on 01/05/2024 for the exam, diagnosis, procedures, and orders are all accurate and complete.

## 2024-01-07 ENCOUNTER — Other Ambulatory Visit: Payer: Self-pay | Admitting: Family Medicine

## 2024-01-07 DIAGNOSIS — K219 Gastro-esophageal reflux disease without esophagitis: Secondary | ICD-10-CM

## 2024-01-12 ENCOUNTER — Ambulatory Visit: Admitting: Orthopaedic Surgery

## 2024-01-12 DIAGNOSIS — M19012 Primary osteoarthritis, left shoulder: Secondary | ICD-10-CM | POA: Diagnosis not present

## 2024-01-12 NOTE — Progress Notes (Signed)
 Office Visit Note   Patient: Betty [name]           Date of Birth: Apr 01, 1945           MRN: [id number] Visit Date: 01/12/2024              Requested by: Thedora Garnette HERO, MD [address] [place],  [place] [address] PCP: Thedora Garnette HERO, MD   Assessment & Plan: Visit Diagnoses:  1. Primary osteoarthritis, left shoulder     Plan: History of Present Illness Betty [name] is an 78 year old female who presents with left shoulder pain.  She experiences severe left shoulder pain triggered by certain movements, accompanied by a popping sensation. The pain is significant enough to almost cause her to drop objects. Initially, she had right-sided neck pain radiating down the arm, but the left shoulder pain has become more problematic.  She has difficulty sleeping due to the pain and requires positioning her arm on a pillow for relief. Pain occurs when attempting to scratch her back, a movement previously performed without discomfort.  X-rays from November 12th show arthritis in the left shoulder joint with a large bone spur.  There is no pain during external or internal rotation of the shoulder, but pain is present during shoulder flexion. No popping sensation is noted during the examination.  Physical Exam MUSCULOSKELETAL: Pain on flexion of left shoulder.  Pain along joint line.  MMT of rotator cuff is normal.  Slightly decreased forward flexion ROM.  Assessment and Plan Primary osteoarthritis of the left shoulder - Scheduled left shoulder injection with Dr. Burnetta for pain management. - Referred to physical therapy to preserve range of motion and flexibility.  Follow-Up Instructions: No follow-ups on file.   Orders:  Orders Placed This Encounter  Procedures   Ambulatory referral to Physical Therapy   No orders of the defined types were placed in this encounter.     Procedures: No procedures performed   Clinical Data: No additional  findings.   Subjective: Chief Complaint  Patient presents with   Left Shoulder - Pain    HPI  Review of Systems  Constitutional: Negative.   HENT: Negative.    Eyes: Negative.   Respiratory: Negative.    Cardiovascular: Negative.   Endocrine: Negative.   Musculoskeletal: Negative.   Neurological: Negative.   Hematological: Negative.   Psychiatric/Behavioral: Negative.    All other systems reviewed and are negative.    Objective: Vital Signs: There were no vitals taken for this visit.  Physical Exam Vitals and nursing note reviewed.  Constitutional:      Appearance: She is well-developed.  HENT:     Head: Atraumatic.     Nose: Nose normal.  Eyes:     Extraocular Movements: Extraocular movements intact.  Cardiovascular:     Pulses: Normal pulses.  Pulmonary:     Effort: Pulmonary effort is normal.  Abdominal:     Palpations: Abdomen is soft.  Musculoskeletal:     Cervical back: Neck supple.  Skin:    General: Skin is warm.     Capillary Refill: Capillary refill takes less than 2 seconds.  Neurological:     Mental Status: She is alert. Mental status is at baseline.  Psychiatric:        Behavior: Behavior normal.        Thought Content: Thought content normal.        Judgment: Judgment normal.     Ortho Exam  Specialty  Comments:  No specialty comments available.  Imaging: No results found.   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    Gastroesophageal reflux disease 10/21/2018   Hyperlipidemia 10/21/2018   History of pulmonary embolus (PE) - Dx'd 05/2018  06/21/2018   Past Medical History:  Diagnosis Date   High cholesterol    History of pulmonary embolus (PE) - Dx'd 05/2018  06/21/2018    Family History  Problem Relation Age of Onset   Hypertension Mother    Hypertension Father    Alzheimer's disease Father    Cancer Daughter        Breast   Cancer Paternal Aunt    Heart disease Paternal Grandmother     Past Surgical History:  Procedure Laterality Date   CESAREAN SECTION     EYE SURGERY     LAPAROSCOPIC APPENDECTOMY N/A 07/06/2019   Procedure: APPENDECTOMY LAPAROSCOPIC, TAP BLOCK;  Surgeon: Sheldon Standing, MD;  Location: [facility];  Service: General;  Laterality: N/A;   Social History   Occupational History   Occupation: retired    Associate Professor: [employer]  Tobacco Use   Smoking status: Never   Smokeless tobacco: Never  Vaping Use   Vaping status: Never Used  Substance and Sexual Activity   Alcohol use: Never   Drug use: Never   Sexual activity: Not Currently

## 2024-01-17 ENCOUNTER — Other Ambulatory Visit: Payer: Self-pay

## 2024-01-17 ENCOUNTER — Ambulatory Visit: Admitting: Sports Medicine

## 2024-01-17 DIAGNOSIS — M19012 Primary osteoarthritis, left shoulder: Secondary | ICD-10-CM | POA: Diagnosis not present

## 2024-01-17 MED ORDER — BUPIVACAINE HCL 0.25 % IJ SOLN
2.0000 mL | INTRAMUSCULAR | Status: AC | PRN
  Administered 2024-01-17: 2 mL via INTRA_ARTICULAR

## 2024-01-17 MED ORDER — METHYLPREDNISOLONE ACETATE 40 MG/ML IJ SUSP
80.0000 mg | INTRAMUSCULAR | Status: AC | PRN
  Administered 2024-01-17: 80 mg via INTRA_ARTICULAR

## 2024-01-17 MED ORDER — LIDOCAINE HCL 1 % IJ SOLN
2.0000 mL | INTRAMUSCULAR | Status: AC | PRN
  Administered 2024-01-17: 2 mL

## 2024-01-17 NOTE — Progress Notes (Signed)
   Procedure Note  Patient: Betty [name]             Date of Birth: November 16, 1945           MRN: [id number]             Visit Date: 01/17/2024  Procedures: Visit Diagnoses:  1. Primary osteoarthritis, left shoulder    Large Joint Inj: L glenohumeral on 01/17/2024 9:47 AM Indications: pain Details: 22 G 3.5 in needle, ultrasound-guided posterior approach Medications: 2 mL lidocaine  1 %; 2 mL bupivacaine  0.25 %; 80 mg methylPREDNISolone  acetate 40 MG/ML Outcome: tolerated well, no immediate complications  US -guided glenohumeral joint injection, left shoulder After discussion on risks/benefits/indications, informed verbal consent was obtained. A timeout was then performed. The patient was positioned lying lateral recumbent on examination table. The patient's shoulder was prepped with betadine and multiple alcohol swabs and utilizing ultrasound guidance, the patient's glenohumeral joint was identified on ultrasound. Using ultrasound guidance a 22-gauge, 3.5 inch needle with a mixture of 2:2:2 cc's lidocaine :bupivicaine:depomedrol was directed from a lateral to medial direction via in-plane technique into the glenohumeral joint with visualization of appropriate spread of injectate into the joint. Patient tolerated the procedure well without immediate complications.       Procedure, treatment alternatives, risks and benefits explained, specific risks discussed. Consent was given by the patient. Immediately prior to procedure a time out was called to verify the correct patient, procedure, equipment, support staff and site/side marked as required. Patient was prepped and draped in the usual sterile fashion.     - patient tolerated procedure well, discussed post-injection protocol - Patient has been approved and authorized for PT, she will schedule this on her way out - follow-up with Dr. Jerri as indicated; I am happy to see them as needed  Lonell Sprang, DO Primary Care Sports Medicine Physician   [facility] - Orthopedics  This note was dictated using Dragon naturally speaking software and may contain errors in syntax, spelling, or content which have not been identified prior to signing this note.

## 2024-01-27 NOTE — Therapy (Signed)
 OUTPATIENT PHYSICAL THERAPY SHOULDER EVALUATION   Patient Name: Betty [name] MRN: [id number] DOB:[birth year], 78 y.o., female Today's Date: 01/28/2024  END OF SESSION:  PT End of Session - 01/28/24 1646     Visit Number 1    Number of Visits 12    Date for Recertification  03/24/24    Authorization Type [employer]    Authorization - Visit Number 1    Progress Note Due on Visit 12    PT Start Time 1300    PT Stop Time 1348    PT Time Calculation (min) 48 min    Activity Tolerance Patient tolerated treatment well;No increased pain;Patient limited by pain    Behavior During Therapy [facility] for tasks assessed/performed          Past Medical History:  Diagnosis Date   High cholesterol    History of pulmonary embolus (PE) - Dx'd 05/2018  06/21/2018   Past Surgical History:  Procedure Laterality Date   CESAREAN SECTION     EYE SURGERY     LAPAROSCOPIC APPENDECTOMY N/A 07/06/2019   Procedure: APPENDECTOMY LAPAROSCOPIC, TAP BLOCK;  Surgeon: Sheldon Standing, MD;  Location: [facility];  Service: General;  Laterality: N/A;   Patient Active Problem List   Diagnosis Date Noted   Sciatica of left side 05/18/2023   Allergic rhinitis 09/26/2021   Dysphagia 06/13/2020   Prediabetes 08/02/2019   Acute phlegmonous appendicitis s/p lap appendectomy 07/06/2019 07/05/2019   Noncompliance with medication treatment due to intermittent use of medication 07/05/2019   Cataract 05/03/2019   Osteopenia of left upper arm 05/03/2019   Arthritis of left acromioclavicular joint 05/03/2019   Chronic pain of both shoulders 05/03/2019   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   Gastroesophageal reflux disease 10/21/2018   Hyperlipidemia 10/21/2018   History of pulmonary embolus (PE) - Dx'd 05/2018  06/21/2018    PCP: Garnette EMERSON Simpler, MD  REFERRING PROVIDER: Kay CHRISTELLA Cummins, MD  REFERRING DIAG:  M19.012 (ICD-10-CM) - Primary osteoarthritis, left shoulder   THERAPY DIAG:  Abnormal posture - Plan: PT  plan of care cert/re-cert  Muscle weakness (generalized) - Plan: PT plan of care cert/re-cert  Stiffness of left shoulder, not elsewhere classified - Plan: PT plan of care cert/re-cert  Acute pain of left shoulder - Plan: PT plan of care cert/re-cert  Rationale for Evaluation and Treatment: Rehabilitation  ONSET DATE: 6-8 weeks ago with reaching up in a cabinet  SUBJECTIVE:  SUBJECTIVE STATEMENT: Arayah notes her left shoulder flared-up 6-8 weeks ago when reaching up into a cabinet.  She doesn't sleep on her left side anymore.  Difficulty with sleep is improving.   PERTINENT HISTORY: High cholesterol, DM, history of PE 2020, Lt sciatica, pre-diabetes, Lt humerus osteopenia  PAIN:  Are you having pain? Yes: NPRS scale: 3-7/10 this week Pain location: Left shoulder Pain description: Ache, can be sharp Aggravating factors: Reaching, overhead and behind the back function Relieving factors: Some relief with pain meds  PRECAUTIONS: None  RED FLAGS: None   WEIGHT BEARING RESTRICTIONS: No  FALLS:  Has patient fallen in last 6 months? No  LIVING ENVIRONMENT: Lives with: lives alone Lives in: House/apartment Stairs: No problems Has following equipment at home: None  OCCUPATION: Retired  PLOF: Independent  PATIENT GOALS: Return to making beads.  Walk and do normal activities without left shoulder pain.  NEXT MD VISIT: 01/31/2024 Dr. Burnetta  OBJECTIVE:  Note: Objective measures were completed at Evaluation unless otherwise noted.  DIAGNOSTIC FINDINGS:  1. Moderate acromioclavicular osteoarthritis on the left with mild glenohumeral osteoarthritis on the left.  PATIENT SURVEYS:  PSFS: THE PATIENT SPECIFIC FUNCTIONAL SCALE  Place score of 0-10 (0 = unable to perform activity and 10 = able to  perform activity at the same level as before injury or problem)  Activity Date: 01/28/2024    Reach 5    2.  Overhead function 5    3.  Reaching behind the back 4    4.  Sleeping 8    Total Score 5.5      Total Score = Sum of activity scores/number of activities  Minimally Detectable Change: 3 points (for single activity); 2 points (for average score)  [facility] (nd). The Patient Specific Functional Scale . Retrieved from [email or web address]   COGNITION: Overall cognitive status: Within functional limits for tasks assessed     SENSATION: WFL  POSTURE: Significant for mild forward head, internally rotated and protracted shoulders  UPPER EXTREMITY ROM:   Passive ROM Left/Right 01/28/2024   Shoulder flexion 125/150   Shoulder extension    Shoulder abduction    Shoulder horizontal adduction 35/30   Shoulder internal rotation 20/35   Shoulder external rotation 90/110   Elbow flexion    Elbow extension    Wrist flexion    Wrist extension    Wrist ulnar deviation    Wrist radial deviation    Wrist pronation    Wrist supination    (Blank rows = not tested)  UPPER EXTREMITY STRENGTH:  In pounds assessed with a hand-held dynamometer Left/Right 01/28/2024   Shoulder flexion    Shoulder extension    Shoulder abduction    Shoulder adduction    Shoulder internal rotation 14.0/18.3   Shoulder external rotation 9.2/12.4   Middle trapezius    Lower trapezius    Elbow flexion    Elbow extension    Wrist flexion    Wrist extension    Wrist ulnar deviation    Wrist radial deviation    Wrist pronation    Wrist supination    Grip strength (lbs)    (Blank rows = not tested)  TREATMENT DATE:  01/28/2024 Supine arm raise/scapular protraction with 3 pounds 20 x 3 seconds Supine internal rotation stretch 70  degrees abduction and elbows on pillows to elevate the elbow above shoulder height, reaching towards the pocket 10 x 10 seconds Scapular retraction/shoulder blade pinch 10 x 5 seconds Red Thera-Band external rotation 10 x 3 seconds Red Thera-Band internal rotation 10 x 3 seconds  [id number]: Reviewed imaging and shoulder anatomy with the shoulder model; reviewed examination findings; reviewed day 1 home exercise program  PATIENT EDUCATION: Education details: See above Person educated: Patient Education method: Explanation, Demonstration, Tactile cues, Verbal cues, and Handouts Education comprehension: verbalized understanding, returned demonstration, verbal cues required, tactile cues required, and needs further education  HOME EXERCISE PROGRAM: Access Code: [email or web address] URL: [email or web address] Date: 01/28/2024 Prepared by: Lamar Ivory  Exercises - Supine Scapular Protraction in Flexion with Dumbbells  - 2-3 x daily - 7 x weekly - 1 sets - 20 reps - 3 seconds hold - Standing Scapular Retraction  - 5 x daily - 7 x weekly - 1 sets - 5 reps - 5 second hold - Supine Shoulder Internal Rotation Stretch  - 2-3 x daily - 7 x weekly - 1 sets - 10-20 reps - 10 seconds hold - Shoulder External Rotation with Anchored Resistance  - 2 x daily - 7 x weekly - 1 sets - 10 reps - 3 hold - Shoulder Internal Rotation with Resistance  - 2 x daily - 7 x weekly - 1 sets - 10 reps - 3 hold  ASSESSMENT:  CLINICAL IMPRESSION: Patient is a 78 y.o. female who was seen today for physical therapy evaluation and treatment for  M19.012 (ICD-10-CM) - Primary osteoarthritis, left shoulder  .  Isela notes difficulty with reaching, overhead and behind the back function.  She had impairments in active range of motion, flexibility and shoulder strength.  Particular impairments were noted in posterior capsule flexibility, scapular and rotator cuff strength.  She was started on an appropriate home exercise  program to meet the below listed goals.  OBJECTIVE IMPAIRMENTS: decreased activity tolerance, decreased endurance, decreased knowledge of condition, decreased ROM, decreased strength, impaired perceived functional ability, impaired UE functional use, postural dysfunction, and pain.   ACTIVITY LIMITATIONS: carrying, lifting, and reach over head  PARTICIPATION LIMITATIONS: meal prep, cleaning, and community activity  PERSONAL FACTORS: High cholesterol, DM, history of PE 2020, Lt sciatica, pre-diabetes, Lt humerus osteopenia are also affecting patient's functional outcome.   REHAB POTENTIAL: Good  CLINICAL DECISION MAKING: Stable/uncomplicated  EVALUATION COMPLEXITY: Low   GOALS: Goals reviewed with patient? Yes  SHORT TERM GOALS: Target date: 02/25/2023  Angelo will be independent with her day 1 home exercise program Baseline: Started 01/28/2024 Goal status: INITIAL  2.  Improve shoulder active range of motion for flexion to 150/150; IR to 45/45 and horizontal adduction to 40/40 Baseline: 125/150; 20/35 and 35/30 Goal status: INITIAL  3.  Improve bilateral shoulder strength as assessed by hand-held dynamometer Baseline: See objective Goal status: INITIAL   [place] TERM GOALS: Target date: 03/25/2023  Improve patient-specific functional score to at least 7.5 Baseline: 5.5 Goal status: INITIAL  2.  Improve left shoulder pain to no greater than 3/10 on the visual analog scale Baseline: 3-7/10 Goal status: INITIAL  3.  Improve bilateral shoulder internal rotation to at least 50 degrees Baseline: 20/35 degrees Goal status: INITIAL  4.  Improve bilateral shoulder strength for external rotation to at least 15 pounds and internal rotation to at least 22 pounds  Baseline: 9.2/12.4 and 14.0/18.3 pounds Goal status: INITIAL  5.  Mareena will be independent with her [place]-term maintenance home exercise program at discharge Baseline: Started 01/28/2024 Goal status: INITIAL  PLAN:  PT  FREQUENCY: 1-2x/week  PT DURATION: 8 weeks  PLANNED INTERVENTIONS: 97110-Therapeutic exercises, 97530- Therapeutic activity, 97112- Neuromuscular re-education, 97535- Self Care, [id number]- Manual therapy, Patient/Family education, Joint mobilization, Cryotherapy, and Moist heat  PLAN FOR NEXT SESSION: Emphasis on posterior capsular flexibility, scapular and rotator cuff strength to improve reaching, overhead and behind the back function.   Myer LELON Ivory, PT, MPT 01/28/2024, 5:02 PM

## 2024-01-28 ENCOUNTER — Ambulatory Visit: Admitting: Rehabilitative and Restorative Service Providers"

## 2024-01-28 ENCOUNTER — Encounter: Payer: Self-pay | Admitting: Rehabilitative and Restorative Service Providers"

## 2024-01-28 DIAGNOSIS — M25612 Stiffness of left shoulder, not elsewhere classified: Secondary | ICD-10-CM | POA: Diagnosis not present

## 2024-01-28 DIAGNOSIS — M25512 Pain in left shoulder: Secondary | ICD-10-CM

## 2024-01-28 DIAGNOSIS — M6281 Muscle weakness (generalized): Secondary | ICD-10-CM | POA: Diagnosis not present

## 2024-01-28 DIAGNOSIS — R293 Abnormal posture: Secondary | ICD-10-CM

## 2024-01-31 ENCOUNTER — Ambulatory Visit: Admitting: Sports Medicine

## 2024-01-31 ENCOUNTER — Encounter: Payer: Self-pay | Admitting: Sports Medicine

## 2024-01-31 DIAGNOSIS — G8929 Other chronic pain: Secondary | ICD-10-CM | POA: Diagnosis not present

## 2024-01-31 DIAGNOSIS — M25512 Pain in left shoulder: Secondary | ICD-10-CM | POA: Diagnosis not present

## 2024-01-31 DIAGNOSIS — M19012 Primary osteoarthritis, left shoulder: Secondary | ICD-10-CM | POA: Diagnosis not present

## 2024-01-31 NOTE — Progress Notes (Signed)
 Betty [name] - 78 y.o. female MRN [id number]  Date of birth: May 02, 1945  Office Visit Note: Visit Date: 01/31/2024 PCP: Thedora Garnette HERO, MD Referred by: Thedora Garnette HERO, MD  Subjective: Chief Complaint  Patient presents with   Left Shoulder - Follow-up   HPI: Betty [name] is a very pleasant 78 y.o. female who presents today for follow-up of left shoulder pain.  Betty [name] states that she was sore for a few days after the injection, but has since noticed good improvement. She does still get some soreness, but takes Aleve  and feels relief. She does feel her motion has improved. She has had one session of physical therapy and was given stretches and exercises to do at home until her next visit in January.  She has been doing these consistently on her own about twice daily.  Pertinent ROS were reviewed with the patient and found to be negative unless otherwise specified above in HPI.   Assessment & Plan: Visit Diagnoses:  1. Primary osteoarthritis, left shoulder   2. Chronic left shoulder pain    Plan: Impression is chronic left shoulder pain with osteoarthritis and a moderate to large inferior spur of the humeral head.  Overall her pain is much improved after ultrasound-guided intra-articular injection as well as formal PT evaluation with consistent home exercises.  At this point, she will continue her HEP on the daily basis.  She may use naproxen  only as needed if pain flares.  She will continue to progress PT and HEP and will follow-up with Dr. Jerri myself only as needed.  Did discuss infrequent injections if needed for osteoarthritic flare.   Follow-up: Return if symptoms worsen or fail to improve.   Meds & Orders: No orders of the defined types were placed in this encounter.  No orders of the defined types were placed in this encounter.    Procedures: No procedures performed      Clinical History: No specialty comments available.  She reports that she has never smoked. She has  never used smokeless tobacco.  Recent Labs    11/16/23 1052  HGBA1C 6.2    Objective:    Physical Exam  Gen: Well-appearing, in no acute distress; non-toxic CV: Well-perfused. Warm.  Resp: Breathing unlabored on room air; no wheezing. Psych: Fluid speech in conversation; appropriate affect; normal thought process  Ortho Exam - Left shoulder: No redness swelling or effusion.  There is near full active and passive range of motion in all directions.  There is some limitation pain with internal rotation.  There is a negative drop arm and negative empty can test.  Rotator cuff testing is intact without notable weakness.  Imaging:  *Independent review of shoulder x-ray from 01/05/2024 demonstrates mild to moderate arthritic changes with a large inferior osteophyte off the humeral head.  No acute fracture.  Narrative & Impression  EXAM: 1 VIEW XRAY OF THE LEFT SHOULDER 01/05/2024 02:57:16 PM   COMPARISON: 01/17/2019   CLINICAL HISTORY: Left shoulder pain with popping, limited ROM.   FINDINGS:   BONES AND JOINTS: Mild degenerative change is seen above in the left glenohumeral joint. Moderate degenerative change is seen involving the left acromioclavicular joint. No acute fracture or dislocation.   SOFT TISSUES: No abnormal calcifications. Visualized lung is unremarkable.   IMPRESSION: 1. Moderate acromioclavicular osteoarthritis on the left with mild glenohumeral osteoarthritis on the left.   Electronically signed by: Lynwood Seip MD 01/05/2024 04:11 PM EST RP Workstation: [id number]    Past Medical/Family/Surgical/Social  History: Medications & Allergies reviewed per EMR, new medications updated. Patient Active Problem List   Diagnosis Date Noted   Sciatica of left side 05/18/2023   Allergic rhinitis 09/26/2021   Dysphagia 06/13/2020   Prediabetes 08/02/2019   Acute phlegmonous appendicitis s/p lap appendectomy 07/06/2019 07/05/2019   Noncompliance with medication  treatment due to intermittent use of medication 07/05/2019   Cataract 05/03/2019   Osteopenia of left upper arm 05/03/2019   Arthritis of left acromioclavicular joint 05/03/2019   Chronic pain of both shoulders 05/03/2019   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   Gastroesophageal reflux disease 10/21/2018   Hyperlipidemia 10/21/2018   History of pulmonary embolus (PE) - Dx'd 05/2018  06/21/2018   Past Medical History:  Diagnosis Date   High cholesterol    History of pulmonary embolus (PE) - Dx'd 05/2018  06/21/2018   Family History  Problem Relation Age of Onset   Hypertension Mother    Hypertension Father    Alzheimer's disease Father    Cancer Daughter        Breast   Cancer Paternal Aunt    Heart disease Paternal Grandmother    Past Surgical History:  Procedure Laterality Date   CESAREAN SECTION     EYE SURGERY     LAPAROSCOPIC APPENDECTOMY N/A 07/06/2019   Procedure: APPENDECTOMY LAPAROSCOPIC, TAP BLOCK;  Surgeon: Sheldon Standing, MD;  Location: [facility];  Service: General;  Laterality: N/A;   Social History   Occupational History   Occupation: retired    Associate Professor: [employer]  Tobacco Use   Smoking status: Never   Smokeless tobacco: Never  Vaping Use   Vaping status: Never Used  Substance and Sexual Activity   Alcohol use: Never   Drug use: Never   Sexual activity: Not Currently

## 2024-01-31 NOTE — Progress Notes (Signed)
 Patient says that she was sore for a few days after the injection, but has since noticed some improvements. She does still get some soreness, but takes Aleve  and feels relief. She does feel her motion has improved. She has had one session of physical therapy and was given stretches and exercises to do at home until her next visit in January.

## 2024-02-11 ENCOUNTER — Encounter: Payer: Self-pay | Admitting: Rehabilitative and Restorative Service Providers"

## 2024-02-11 ENCOUNTER — Ambulatory Visit: Admitting: Rehabilitative and Restorative Service Providers"

## 2024-02-11 DIAGNOSIS — R293 Abnormal posture: Secondary | ICD-10-CM

## 2024-02-11 DIAGNOSIS — M25512 Pain in left shoulder: Secondary | ICD-10-CM

## 2024-02-11 DIAGNOSIS — M6281 Muscle weakness (generalized): Secondary | ICD-10-CM

## 2024-02-11 DIAGNOSIS — M25612 Stiffness of left shoulder, not elsewhere classified: Secondary | ICD-10-CM | POA: Diagnosis not present

## 2024-02-11 NOTE — Therapy (Signed)
 " OUTPATIENT PHYSICAL THERAPY SHOULDER TREATMENT   Patient Name: Betty [name] MRN: [id number] DOB:1945-03-28, 78 y.o., female Today's Date: 02/11/2024  END OF SESSION:  PT End of Session - 02/11/24 1717     Visit Number 2    Number of Visits 12    Date for Recertification  03/24/24    Authorization Type HUMANA    Authorization - Visit Number 2    Progress Note Due on Visit 12    PT Start Time 1147    PT Stop Time 1228    PT Time Calculation (min) 41 min    Activity Tolerance Patient tolerated treatment well;No increased pain;Patient limited by pain    Behavior During Therapy [facility] for tasks assessed/performed           Past Medical History:  Diagnosis Date   High cholesterol    History of pulmonary embolus (PE) - Dx'd 05/2018  06/21/2018   Past Surgical History:  Procedure Laterality Date   CESAREAN SECTION     EYE SURGERY     LAPAROSCOPIC APPENDECTOMY N/A 07/06/2019   Procedure: APPENDECTOMY LAPAROSCOPIC, TAP BLOCK;  Surgeon: Sheldon Standing, MD;  Location: [facility];  Service: General;  Laterality: N/A;   Patient Active Problem List   Diagnosis Date Noted   Sciatica of left side 05/18/2023   Allergic rhinitis 09/26/2021   Dysphagia 06/13/2020   Prediabetes 08/02/2019   Acute phlegmonous appendicitis s/p lap appendectomy 07/06/2019 07/05/2019   Noncompliance with medication treatment due to intermittent use of medication 07/05/2019   Cataract 05/03/2019   Osteopenia of left upper arm 05/03/2019   Arthritis of left acromioclavicular joint 05/03/2019   Chronic pain of both shoulders 05/03/2019   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   Gastroesophageal reflux disease 10/21/2018   Hyperlipidemia 10/21/2018   History of pulmonary embolus (PE) - Dx'd 05/2018  06/21/2018    PCP: Garnette EMERSON Simpler, MD  REFERRING PROVIDER: Kay CHRISTELLA Cummins, MD  REFERRING DIAG:  [phone] (ICD-10-CM) - Primary osteoarthritis, left shoulder   THERAPY DIAG:  Abnormal posture  Muscle  weakness (generalized)  Stiffness of left shoulder, not elsewhere classified  Acute pain of left shoulder  Rationale for Evaluation and Treatment: Rehabilitation  ONSET DATE: 6-8 weeks ago with reaching up in a cabinet  SUBJECTIVE:                                                                                                                                                                                      SUBJECTIVE STATEMENT: Lior notes compliance with her early home exercise program.  Kerston notes her left shoulder flared-up 6-8 weeks ago when reaching up into a  cabinet.  She doesn't sleep on her left side anymore.  Difficulty with sleep is improving.   PERTINENT HISTORY: High cholesterol, DM, history of PE 2020, Lt sciatica, pre-diabetes, Lt humerus osteopenia  PAIN:  Are you having pain? Yes: NPRS scale: Remains 3-7/10 this week Pain location: Left shoulder Pain description: Ache, can be sharp Aggravating factors: Reaching, overhead and behind the back function Relieving factors: Some relief with pain meds  PRECAUTIONS: None  RED FLAGS: None   WEIGHT BEARING RESTRICTIONS: No  FALLS:  Has patient fallen in last 6 months? No  LIVING ENVIRONMENT: Lives with: lives alone Lives in: House/apartment Stairs: No problems Has following equipment at home: None  OCCUPATION: Retired  PLOF: Independent  PATIENT GOALS: Return to making beads.  Walk and do normal activities without left shoulder pain.  NEXT MD VISIT: NA  OBJECTIVE:  Note: Objective measures were completed at Evaluation unless otherwise noted.  DIAGNOSTIC FINDINGS:  1. Moderate acromioclavicular osteoarthritis on the left with mild glenohumeral osteoarthritis on the left.  PATIENT SURVEYS:  PSFS: THE PATIENT SPECIFIC FUNCTIONAL SCALE  Place score of 0-10 (0 = unable to perform activity and 10 = able to perform activity at the same level as before injury or problem)  Activity Date: 01/28/2024     Reach 5    2.  Overhead function 5    3.  Reaching behind the back 4    4.  Sleeping 8    Total Score 5.5      Total Score = Sum of activity scores/number of activities  Minimally Detectable Change: 3 points (for single activity); 2 points (for average score)  [facility] (nd). The Patient Specific Functional Scale . Retrieved from [email or web address]   COGNITION: Overall cognitive status: Within functional limits for tasks assessed     SENSATION: WFL  POSTURE: Significant for mild forward head, internally rotated and protracted shoulders  UPPER EXTREMITY ROM:   Passive ROM Left/Right 01/28/2024 Left 02/11/2024  Shoulder flexion 125/150 145  Shoulder extension    Shoulder abduction    Shoulder horizontal adduction 35/30 45  Shoulder internal rotation 20/35 40  Shoulder external rotation 90/110 80  Elbow flexion    Elbow extension    Wrist flexion    Wrist extension    Wrist ulnar deviation    Wrist radial deviation    Wrist pronation    Wrist supination    (Blank rows = not tested)  UPPER EXTREMITY STRENGTH:  In pounds assessed with a hand-held dynamometer Left/Right 01/28/2024   Shoulder flexion    Shoulder extension    Shoulder abduction    Shoulder adduction    Shoulder internal rotation 14.0/18.3   Shoulder external rotation 9.2/12.4   Middle trapezius    Lower trapezius    Elbow flexion    Elbow extension    Wrist flexion    Wrist extension    Wrist ulnar deviation    Wrist radial deviation    Wrist pronation    Wrist supination    Grip strength (lbs)    (Blank rows = not tested)  TREATMENT DATE:  02/11/2024 Supine arm raise/scapular protraction with 3 pounds 20 x 3 seconds Supine internal rotation stretch 70 degrees abduction and elbows on pillows to elevate the elbow above  shoulder height, reaching towards the pocket 20 x 10 seconds Scapular retraction/shoulder blade pinch 10 x 5 seconds Red Thera-Band external rotation 10 x 3 seconds Red Thera-Band internal rotation 10 x 3 seconds  Functional Activities: Thumb up the back stretch 10 x 10 seconds Supine shoulder flexion with protraction (palm facing in, stay within comfortable range) 10 x 10 seconds  02464: Reviewed objective measures obtained today; reviewed updates and progressions to her current home exercises   01/28/2024 Supine arm raise/scapular protraction with 3 pounds 20 x 3 seconds Supine internal rotation stretch 70 degrees abduction and elbows on pillows to elevate the elbow above shoulder height, reaching towards the pocket 10 x 10 seconds Scapular retraction/shoulder blade pinch 10 x 5 seconds Red Thera-Band external rotation 10 x 3 seconds Red Thera-Band internal rotation 10 x 3 seconds  02464: Reviewed imaging and shoulder anatomy with the shoulder model; reviewed examination findings; reviewed day 1 home exercise program  PATIENT EDUCATION: Education details: See above Person educated: Patient Education method: Explanation, Demonstration, Tactile cues, Verbal cues, and Handouts Education comprehension: verbalized understanding, returned demonstration, verbal cues required, tactile cues required, and needs further education  HOME EXERCISE PROGRAM: Access Code: [email or web address] URL: [email or web address] Date: 02/11/2024 Prepared by: Lamar Ivory  Exercises - Supine Scapular Protraction in Flexion with Dumbbells  - 2-3 x daily - 7 x weekly - 1 sets - 20 reps - 3 seconds hold - Standing Scapular Retraction  - 5 x daily - 7 x weekly - 1 sets - 5 reps - 5 second hold - Supine Shoulder Internal Rotation Stretch  - 2-3 x daily - 7 x weekly - 1 sets - 20 reps - 10 seconds hold - Shoulder External Rotation with Anchored Resistance  - 2 x daily - 7 x weekly - 2 sets - 10 reps - 3  hold - Shoulder Internal Rotation with Resistance  - 2 x daily - 7 x weekly - 1 sets - 10 reps - 3 hold - Standing Shoulder Internal Rotation Stretch with Hands Behind Back  - 2-3 x daily - 7 x weekly - 1 sets - 10 reps - 10 seconds hold - Supine Shoulder Flexion AAROM with Dowel  - 2-3 x daily - 7 x weekly - 1 sets - 10 reps - 5 seconds hold  ASSESSMENT:  CLINICAL IMPRESSION: Charlesa reports early compliance with her home exercises.  Objectively, she is moving better today than she was at evaluation.  However, if today was her first day, she would still have the same impairments in posterior capsular flexibility, scapular and rotator cuff strength.  We will continue to progress Meesha's physical therapy to improve her left shoulder pain and function.  Patient is a 78 y.o. female who was seen today for physical therapy evaluation and treatment for  M19.012 (ICD-10-CM) - Primary osteoarthritis, left shoulder  .  Jaylee notes difficulty with reaching, overhead and behind the back function.  She had impairments in active range of motion, flexibility and shoulder strength.  Particular impairments were noted in posterior capsule flexibility, scapular and rotator cuff strength.  She was started on an appropriate home exercise program to meet the below listed goals.  OBJECTIVE IMPAIRMENTS: decreased activity tolerance, decreased endurance, decreased knowledge of condition, decreased ROM, decreased strength, impaired perceived functional ability, impaired UE functional use,  postural dysfunction, and pain.   ACTIVITY LIMITATIONS: carrying, lifting, and reach over head  PARTICIPATION LIMITATIONS: meal prep, cleaning, and community activity  PERSONAL FACTORS: High cholesterol, DM, history of PE 2020, Lt sciatica, pre-diabetes, Lt humerus osteopenia are also affecting patient's functional outcome.   REHAB POTENTIAL: Good  CLINICAL DECISION MAKING: Stable/uncomplicated  EVALUATION COMPLEXITY:  Low   GOALS: Goals reviewed with patient? Yes  SHORT TERM GOALS: Target date: 02/25/2023  Rosa will be independent with her day 1 home exercise program Baseline: Started 01/28/2024 Goal status: Met 02/11/2024  2.  Improve shoulder active range of motion for flexion to 150/150; IR to 45/45 and horizontal adduction to 40/40 Baseline: 125/150; 20/35 and 35/30 Goal status: Partially met 02/11/2024  3.  Improve bilateral shoulder strength as assessed by hand-held dynamometer Baseline: See objective Goal status: INITIAL   [place] TERM GOALS: Target date: 03/25/2023  Improve patient-specific functional score to at least 7.5 Baseline: 5.5 Goal status: INITIAL  2.  Improve left shoulder pain to no greater than 3/10 on the visual analog scale Baseline: 3-7/10 Goal status: INITIAL  3.  Improve bilateral shoulder internal rotation to at least 50 degrees Baseline: 20/35 degrees Goal status: INITIAL  4.  Improve bilateral shoulder strength for external rotation to at least 15 pounds and internal rotation to at least 22 pounds Baseline: 9.2/12.4 and 14.0/18.3 pounds Goal status: INITIAL  5.  Vaunda will be independent with her [place]-term maintenance home exercise program at discharge Baseline: Started 01/28/2024 Goal status: INITIAL  PLAN:  PT FREQUENCY: 1-2x/week  PT DURATION: 8 weeks  PLANNED INTERVENTIONS: 97110-Therapeutic exercises, 97530- Therapeutic activity, 97112- Neuromuscular re-education, 97535- Self Care, [id number]- Manual therapy, Patient/Family education, Joint mobilization, Cryotherapy, and Moist heat  PLAN FOR NEXT SESSION: Emphasis on posterior capsular flexibility, scapular and rotator cuff strength to improve reaching, overhead and behind the back function.   Myer LELON Ivory, PT, MPT 02/11/2024, 5:22 PM  "

## 2024-02-18 ENCOUNTER — Encounter: Admitting: Rehabilitative and Restorative Service Providers"

## 2024-02-25 ENCOUNTER — Encounter: Payer: Self-pay | Admitting: Rehabilitative and Restorative Service Providers"

## 2024-02-25 ENCOUNTER — Ambulatory Visit: Admitting: Rehabilitative and Restorative Service Providers"

## 2024-02-25 DIAGNOSIS — M6281 Muscle weakness (generalized): Secondary | ICD-10-CM

## 2024-02-25 DIAGNOSIS — R293 Abnormal posture: Secondary | ICD-10-CM

## 2024-02-25 DIAGNOSIS — M25512 Pain in left shoulder: Secondary | ICD-10-CM | POA: Diagnosis not present

## 2024-02-25 DIAGNOSIS — M25612 Stiffness of left shoulder, not elsewhere classified: Secondary | ICD-10-CM | POA: Diagnosis not present

## 2024-02-25 NOTE — Therapy (Signed)
 " OUTPATIENT PHYSICAL THERAPY SHOULDER TREATMENT   Patient Name: DIKSHA TAGLIAFERRO MRN: [id number] DOB:1945/02/26, 79 y.o., female Today's Date: 02/25/2024  END OF SESSION:  PT End of Session - 02/25/24 1338     Visit Number 3    Number of Visits 12    Date for Recertification  03/24/24    Authorization Type HUMANA    Progress Note Due on Visit 12    PT Start Time 1338    PT Stop Time 1427    PT Time Calculation (min) 49 min    Activity Tolerance Patient tolerated treatment well;No increased pain;Patient limited by pain    Behavior During Therapy [facility] for tasks assessed/performed            Past Medical History:  Diagnosis Date   High cholesterol    History of pulmonary embolus (PE) - Dx'd 05/2018  06/21/2018   Past Surgical History:  Procedure Laterality Date   CESAREAN SECTION     EYE SURGERY     LAPAROSCOPIC APPENDECTOMY N/A 07/06/2019   Procedure: APPENDECTOMY LAPAROSCOPIC, TAP BLOCK;  Surgeon: Sheldon Standing, MD;  Location: [facility];  Service: General;  Laterality: N/A;   Patient Active Problem List   Diagnosis Date Noted   Sciatica of left side 05/18/2023   Allergic rhinitis 09/26/2021   Dysphagia 06/13/2020   Prediabetes 08/02/2019   Acute phlegmonous appendicitis s/p lap appendectomy 07/06/2019 07/05/2019   Noncompliance with medication treatment due to intermittent use of medication 07/05/2019   Cataract 05/03/2019   Osteopenia of left upper arm 05/03/2019   Arthritis of left acromioclavicular joint 05/03/2019   Chronic pain of both shoulders 05/03/2019   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   Gastroesophageal reflux disease 10/21/2018   Hyperlipidemia 10/21/2018   History of pulmonary embolus (PE) - Dx'd 05/2018  06/21/2018    PCP: Garnette EMERSON Simpler, MD  REFERRING PROVIDER: Kay CHRISTELLA Cummins, MD  REFERRING DIAG:  [phone] (ICD-10-CM) - Primary osteoarthritis, left shoulder   THERAPY DIAG:  Abnormal posture  Muscle weakness (generalized)  Stiffness  of left shoulder, not elsewhere classified  Acute pain of left shoulder  Rationale for Evaluation and Treatment: Rehabilitation  ONSET DATE: 6-8 weeks ago with reaching up in a cabinet  SUBJECTIVE:                                                                                                                                                                                      SUBJECTIVE STATEMENT: Nykiah notes partial compliance with her home exercise program as she forgot to do some of the recommended activities.  Richard notes her left shoulder flared-up 6-8 weeks ago when reaching  up into a cabinet.  She doesn't sleep on her left side anymore.  Difficulty with sleep is improving.   PERTINENT HISTORY: High cholesterol, DM, history of PE 2020, Lt sciatica, pre-diabetes, Lt humerus osteopenia  PAIN:  Are you having pain? Yes: NPRS scale:  0-7/10 this week (was 3-7/10) Pain location: Left shoulder Pain description: Ache, can be sharp Aggravating factors: Sleeping, reaching, overhead and behind the back function Relieving factors: Some relief with heat and lidocaine   PRECAUTIONS: None  RED FLAGS: None   WEIGHT BEARING RESTRICTIONS: No  FALLS:  Has patient fallen in last 6 months? No  LIVING ENVIRONMENT: Lives with: lives alone Lives in: House/apartment Stairs: No problems Has following equipment at home: None  OCCUPATION: Retired  PLOF: Independent  PATIENT GOALS: Return to making beads.  Walk and do normal activities without left shoulder pain.  NEXT MD VISIT: NA  OBJECTIVE:  Note: Objective measures were completed at Evaluation unless otherwise noted.  DIAGNOSTIC FINDINGS:  1. Moderate acromioclavicular osteoarthritis on the left with mild glenohumeral osteoarthritis on the left.  PATIENT SURVEYS:  PSFS: THE PATIENT SPECIFIC FUNCTIONAL SCALE  Place score of 0-10 (0 = unable to perform activity and 10 = able to perform activity at the same level as before  injury or problem)  Activity Date: 01/28/2024    Reach 5    2.  Overhead function 5    3.  Reaching behind the back 4    4.  Sleeping 8    Total Score 5.5      Total Score = Sum of activity scores/number of activities  Minimally Detectable Change: 3 points (for single activity); 2 points (for average score)  [employer] [facility] (nd). The Patient Specific Functional Scale . Retrieved from [email or web address]   COGNITION: Overall cognitive status: Within functional limits for tasks assessed     SENSATION: WFL  POSTURE: Significant for mild forward head, internally rotated and protracted shoulders  UPPER EXTREMITY ROM:   Passive ROM Left/Right 01/28/2024 Left 02/11/2024 Left 02/25/2024  Shoulder flexion 125/150 145 145  Shoulder extension     Shoulder abduction     Shoulder horizontal adduction 35/30 45 35  Shoulder internal rotation 20/35 40 60  Shoulder external rotation 90/110 80 90  Elbow flexion     Elbow extension     Wrist flexion     Wrist extension     Wrist ulnar deviation     Wrist radial deviation     Wrist pronation     Wrist supination     (Blank rows = not tested)  UPPER EXTREMITY STRENGTH:  In pounds assessed with a hand-held dynamometer Left/Right 01/28/2024 Left/Right 02/25/2024  Shoulder flexion    Shoulder extension    Shoulder abduction    Shoulder adduction    Shoulder internal rotation 14.0/18.3 17.6/20.3  Shoulder external rotation 9.2/12.4 12.5/14.7  Middle trapezius    Lower trapezius    Elbow flexion    Elbow extension    Wrist flexion    Wrist extension    Wrist ulnar deviation    Wrist radial deviation    Wrist pronation    Wrist supination    Grip strength (lbs)    (Blank rows = not tested)  TREATMENT DATE:  02/25/2024 Supine arm raise/scapular protraction with 3 pounds 20 x  3 seconds Supine internal rotation stretch 70 degrees abduction and elbows on pillows to elevate the elbow above shoulder height, reaching towards the pocket 20 x 10 seconds Scapular retraction/shoulder blade pinch reviewed Red Thera-Band external rotation 2 sets of 10 x 3 seconds Red Thera-Band internal rotation 2 sets of 10 x 3 seconds  Functional Activities: Thumb up the back stretch reviewed Supine shoulder flexion with protraction (palm facing in, stay within comfortable range) 10 x 10 seconds  02464: Reviewed objective measures obtained today; reviewed updates and changes to her current home exercises   02/11/2024 Supine arm raise/scapular protraction with 3 pounds 20 x 3 seconds Supine internal rotation stretch 70 degrees abduction and elbows on pillows to elevate the elbow above shoulder height, reaching towards the pocket 20 x 10 seconds Scapular retraction/shoulder blade pinch 10 x 5 seconds Red Thera-Band external rotation 10 x 3 seconds Red Thera-Band internal rotation 10 x 3 seconds  Functional Activities: Thumb up the back stretch 10 x 10 seconds Supine shoulder flexion with protraction (palm facing in, stay within comfortable range) 10 x 10 seconds  02464: Reviewed objective measures obtained today; reviewed updates and progressions to her current home exercises   01/28/2024 Supine arm raise/scapular protraction with 3 pounds 20 x 3 seconds Supine internal rotation stretch 70 degrees abduction and elbows on pillows to elevate the elbow above shoulder height, reaching towards the pocket 10 x 10 seconds Scapular retraction/shoulder blade pinch 10 x 5 seconds Red Thera-Band external rotation 10 x 3 seconds Red Thera-Band internal rotation 10 x 3 seconds  02464: Reviewed imaging and shoulder anatomy with the shoulder model; reviewed examination findings; reviewed day 1 home exercise program  PATIENT EDUCATION: Education details: See above Person educated:  Patient Education method: Explanation, Demonstration, Tactile cues, Verbal cues, and Handouts Education comprehension: verbalized understanding, returned demonstration, verbal cues required, tactile cues required, and needs further education  HOME EXERCISE PROGRAM: Access Code: [email or web address] URL: [email or web address] Date: 02/25/2024 Prepared by: Lamar Ivory  Exercises - Supine Scapular Protraction in Flexion with Dumbbells  - 2 x daily - 7 x weekly - 1 sets - 20 reps - 3 seconds hold - Standing Scapular Retraction  - 5 x daily - 7 x weekly - 1 sets - 5 reps - 5 second hold - Supine Shoulder Internal Rotation Stretch  - 2 x daily - 1 x weekly - 1 sets - 20 reps - 10 seconds hold - Shoulder External Rotation with Anchored Resistance  - 2 x daily - 7 x weekly - 2 sets - 10 reps - 3 hold - Shoulder Internal Rotation with Resistance  - 2 x daily - 7 x weekly - 2 sets - 10 reps - 3 hold - Standing Shoulder Internal Rotation Stretch with Hands Behind Back  - 2 x daily - 7 x weekly - 1 sets - 10 reps - 10 seconds hold - Supine Shoulder Flexion AAROM with Dowel  - 2 x daily - 7 x weekly - 1 sets - 10 reps - 10 seconds hold - Standing Shoulder Posterior Capsule Stretch  - 2 x daily - 7 x weekly - 1 sets - 10 reps - 10 seconds hold  ASSESSMENT:  CLINICAL IMPRESSION: Lalah continues to make objective progress with her physical therapy.  She has periods of time during the day with no pain, whereas before she had at least 3/10 pain.  Sleep is still difficult  and she can still get symptoms at a level of 7/10.  With consistent home exercise program compliance, her prognosis to meet all below listed goals remains good.    Patient is a 79 y.o. female who was seen today for physical therapy evaluation and treatment for  M19.012 (ICD-10-CM) - Primary osteoarthritis, left shoulder  .  Cat[place]ah notes difficulty with reaching, overhead and behind the back function.  She had impairments in active  range of motion, flexibility and shoulder strength.  Particular impairments were noted in posterior capsule flexibility, scapular and rotator cuff strength.  She was started on an appropriate home exercise program to meet the below listed goals.  OBJECTIVE IMPAIRMENTS: decreased activity tolerance, decreased endurance, decreased knowledge of condition, decreased ROM, decreased strength, impaired perceived functional ability, impaired UE functional use, postural dysfunction, and pain.   ACTIVITY LIMITATIONS: carrying, lifting, and reach over head  PARTICIPATION LIMITATIONS: meal prep, cleaning, and community activity  PERSONAL FACTORS: High cholesterol, DM, history of PE 2020, Lt sciatica, pre-diabetes, Lt humerus osteopenia are also affecting patient's functional outcome.   REHAB POTENTIAL: Good  CLINICAL DECISION MAKING: Stable/uncomplicated  EVALUATION COMPLEXITY: Low   GOALS: Goals reviewed with patient? Yes  SHORT TERM GOALS: Target date: 02/25/2023  Kema will be independent with her day 1 home exercise program Baseline: Started 01/28/2024 Goal status: Met 02/11/2024  2.  Improve shoulder active range of motion for flexion to 150/150; IR to 45/45 and horizontal adduction to 40/40 Baseline: 125/150; 20/35 and 35/30 Goal status: Partially met 02/25/2024  3.  Improve bilateral shoulder strength as assessed by hand-held dynamometer Baseline: See objective Goal status: Met 02/25/2024   [place] TERM GOALS: Target date: 03/25/2023  Improve patient-specific functional score to at least 7.5 Baseline: 5.5 Goal status: INITIAL  2.  Improve left shoulder pain to no greater than 3/10 on the visual analog scale Baseline: 3-7/10 Goal status: Ongoing 02/25/2024  3.  Improve bilateral shoulder internal rotation to at least 50 degrees Baseline: 20/35 degrees Goal status: Met 02/25/2024  4.  Improve bilateral shoulder strength for external rotation to at least 15 pounds and internal rotation to  at least 22 pounds Baseline: 9.2/12.4 and 14.0/18.3 pounds Goal status: Ongoing 02/25/2024  5.  Bernese will be independent with her [place]-term maintenance home exercise program at discharge Baseline: Started 01/28/2024 Goal status: INITIAL  PLAN:  PT FREQUENCY: 1-2x/week  PT DURATION: 4 weeks  PLANNED INTERVENTIONS: 97110-Therapeutic exercises, 97530- Therapeutic activity, 97112- Neuromuscular re-education, 97535- Self Care, [id number]- Manual therapy, Patient/Family education, Joint mobilization, Cryotherapy, and Moist heat  PLAN FOR NEXT SESSION: Emphasis on posterior capsular flexibility, scapular and rotator cuff strength to improve reaching, overhead and behind the back function.  Patient-specific functional score and objective assessment next visit.   Myer LELON Ivory, PT, MPT 02/25/2024, 4:31 PM  "

## 2024-03-06 ENCOUNTER — Ambulatory Visit

## 2024-03-06 VITALS — BP 120/68 | HR 76 | Temp 98.3°F | Ht 63.0 in | Wt 143.8 lb

## 2024-03-06 DIAGNOSIS — Z0000 Encounter for general adult medical examination without abnormal findings: Secondary | ICD-10-CM | POA: Diagnosis not present

## 2024-03-06 NOTE — Patient Instructions (Addendum)
 Betty [name],  Thank you for taking the time for your Medicare Wellness Visit. I appreciate your continued commitment to your health goals. Please review the care plan we discussed, and feel free to reach out if I can assist you further.  Please note that Annual Wellness Visits do not include a physical exam. Some assessments may be limited, especially if the visit was conducted virtually. If needed, we may recommend an in-person follow-up with your provider.  Ongoing Care Seeing your primary care provider every 3 to 6 months helps us  monitor your health and provide consistent, personalized care.   Referrals If a referral was made during today's visit and you haven't received any updates within two weeks, please contact the referred provider directly to check on the status.  Recommended Screenings:  Health Maintenance  Topic Date Due   DTaP/Tdap/Td vaccine (1 - Tdap) Never done   Zoster (Shingles) Vaccine (1 of 2) Never done   Flu Shot  05/23/2024*   Pneumococcal Vaccine for age over 42 (1 of 2 - PCV) 11/30/2024*   Yearly kidney health urinalysis for diabetes  04/09/2027*   Hemoglobin A1C  05/15/2024   Medicare Annual Wellness Visit  03/06/2025   Yearly kidney function blood test for diabetes  11/15/2028   Osteoporosis screening with Bone Density Scan  Completed   Meningitis B Vaccine  Aged Out   Complete foot exam   Discontinued   Breast Cancer Screening  Discontinued   Eye exam for diabetics  Discontinued   COVID-19 Vaccine  Discontinued   Hepatitis C Screening  Discontinued  *Topic was postponed. The date shown is not the original due date.       03/06/2024    4:18 PM  Advanced Directives  Does Patient Have a Medical Advance Directive? Yes  Type of Estate Agent of [place];Living will  Copy of Healthcare Power of Attorney in Chart? No - copy requested    Vision: Annual vision screenings are recommended for early detection of glaucoma, cataracts, and  diabetic retinopathy. These exams can also reveal signs of chronic conditions such as diabetes and high blood pressure.  Dental: Annual dental screenings help detect early signs of oral cancer, gum disease, and other conditions linked to overall health, including heart disease and diabetes.  Please see the attached documents for additional preventive care recommendations.

## 2024-03-06 NOTE — Progress Notes (Signed)
 "  Chief Complaint  Patient presents with   Medicare Wellness     Subjective:   Betty [name] is a 79 y.o. female who presents for a Medicare Annual Wellness Visit.  Visit info / Clinical Intake: Medicare Wellness Visit Type:: Subsequent Annual Wellness Visit Persons participating in visit and providing information:: patient Medicare Wellness Visit Mode:: In-person (required for WTM) Interpreter Needed?: No Pre-visit prep was completed: yes AWV questionnaire completed by patient prior to visit?: no Living arrangements:: (!) lives alone Patient's Overall Health Status Rating: very good Typical amount of pain: some Does pain affect daily life?: no Are you currently prescribed opioids?: no  Dietary Habits and Nutritional Risks How many meals a day?: 2 Eats fruit and vegetables daily?: (!) no Most meals are obtained by: preparing own meals In the last 2 weeks, have you had any of the following?: none Diabetic:: no  Functional Status Activities of Daily Living (to include ambulation/medication): Independent Ambulation: Independent Medication Administration: Independent Home Management (perform basic housework or laundry): Independent Manage your own finances?: yes Primary transportation is: driving Concerns about vision?: no *vision screening is required for WTM* Concerns about hearing?: no  Fall Screening Falls in the past year?: 0 Number of falls in past year: 0 Was there an injury with Fall?: 0 Fall Risk Category Calculator: 0 Patient Fall Risk Level: Low Fall Risk  Fall Risk Patient at Risk for Falls Due to: Medication side effect Fall risk Follow up: Falls prevention discussed; Falls evaluation completed  Home and Transportation Safety: All rugs have non-skid backing?: yes All stairs or steps have railings?: N/A, no stairs Grab bars in the bathtub or shower?: (!) no Have non-skid surface in bathtub or shower?: yes Good home lighting?: yes Regular seat belt  use?: yes Hospital stays in the last year:: no  Cognitive Assessment Difficulty concentrating, remembering, or making decisions? : no Will 6CIT or Mini Cog be Completed: yes What year is it?: 0 points What month is it?: 0 points Give patient an address phrase to remember (5 components): [address] [place] About what time is it?: 0 points Count backwards from 20 to 1: 2 points Say the months of the year in reverse: 4 points Repeat the address phrase from earlier: 2 points 6 CIT Score: 8 points  Advance Directives (For Healthcare) Does Patient Have a Medical Advance Directive?: Yes Does patient want to make changes to medical advance directive?: No - Patient declined Type of Advance Directive: Healthcare Power of [place]; Living will Copy of Healthcare Power of Attorney in Chart?: No - copy requested Copy of Living Will in Chart?: No - copy requested  Reviewed/Updated  Reviewed/Updated: Reviewed All (Medical, Surgical, Family, Medications, Allergies, Care Teams, Patient Goals)    Allergies (verified) Sulfa antibiotics   Current Medications (verified) Outpatient Encounter Medications as of 03/06/2024  Medication Sig   Ascorbic Acid  (VITAMIN C ) 500 MG CAPS Take 500 capsules by mouth daily.   b complex vitamins capsule Take 1 capsule by mouth daily.   carboxymethylcellulose (REFRESH PLUS) 0.5 % SOLN 1 drop 3 (three) times daily as needed.   cholecalciferol (VITAMIN D3) 25 MCG (1000 UNIT) tablet Take 1,000 Units by mouth daily.   fluticasone  (FLONASE ) 50 MCG/ACT nasal spray Place 1 spray into both nostrils daily.   lidocaine  (LIDODERM ) 5 % Place 1 patch onto the skin daily. Remove & Discard patch within 12 hours or as directed by MD   LORATADINE PO Take by mouth.   Multiple Vitamin (  MULTIVITAMIN WITH MINERALS) TABS tablet Take 1 tablet by mouth daily.   omeprazole  (PRILOSEC) 20 MG capsule TAKE 1 CAPSULE BY MOUTH ONCE DAILY AS NEEDED   pravastatin  (PRAVACHOL ) 40 MG tablet  Take 1 tablet (40 mg total) by mouth daily.   vitamin B-12 (CYANOCOBALAMIN ) 100 MCG tablet Take 100 mcg by mouth every other day.   vitamin k 100 MCG tablet Take 100 mcg by mouth daily.   cyclobenzaprine  (FLEXERIL ) 5 MG tablet Take 1 tablet (5 mg total) by mouth at bedtime as needed for muscle spasms. (Patient not taking: Reported on 03/06/2024)   naproxen  (NAPROSYN ) 500 MG tablet Take 1 tablet (500 mg total) by mouth 2 (two) times daily with a meal. (Patient not taking: Reported on 03/06/2024)   No facility-administered encounter medications on file as of 03/06/2024.    History: Past Medical History:  Diagnosis Date   High cholesterol    History of pulmonary embolus (PE) - Dx'd 05/2018  06/21/2018   Past Surgical History:  Procedure Laterality Date   CESAREAN SECTION     EYE SURGERY     LAPAROSCOPIC APPENDECTOMY N/A 07/06/2019   Procedure: APPENDECTOMY LAPAROSCOPIC, TAP BLOCK;  Surgeon: Sheldon Standing, MD;  Location: [facility];  Service: General;  Laterality: N/A;   Family History  Problem Relation Age of Onset   Hypertension Mother    Hypertension Father    Alzheimer's disease Father    Cancer Daughter        Breast   Cancer Paternal Aunt    Heart disease Paternal Grandmother    Social History   Occupational History   Occupation: retired    Associate Professor: [employer]  Tobacco Use   Smoking status: Never   Smokeless tobacco: Never  Vaping Use   Vaping status: Never Used  Substance and Sexual Activity   Alcohol use: Never   Drug use: Never   Sexual activity: Not Currently   Tobacco Counseling Counseling given: Not Answered  SDOH Screenings   Food Insecurity: No Food Insecurity (03/06/2024)  Housing: Unknown (03/06/2024)  Transportation Needs: No Transportation Needs (03/06/2024)  Utilities: Not At Risk (03/06/2024)  Alcohol Screen: Low Risk (03/06/2024)  Depression (PHQ2-9): Low Risk (03/06/2024)  Financial Resource Strain: Low Risk (03/06/2024)  Physical Activity: Sufficiently  Active (03/06/2024)  Social Connections: Socially Isolated (03/06/2024)  Stress: No Stress Concern Present (03/06/2024)  Tobacco Use: Low Risk (03/06/2024)  Health Literacy: Adequate Health Literacy (03/06/2024)   See flowsheets for full screening details  Depression Screen PHQ 2 & 9 Depression Scale- Over the past 2 weeks, how often have you been bothered by any of the following problems? Little interest or pleasure in doing things: 0 Feeling down, depressed, or hopeless (PHQ Adolescent also includes...irritable): 0 PHQ-2 Total Score: 0 Trouble falling or staying asleep, or sleeping too much: 0 Feeling tired or having little energy: 0 Poor appetite or overeating (PHQ Adolescent also includes...weight loss): 0 Feeling bad about yourself - or that you are a failure or have let yourself or your family down: 0 Trouble concentrating on things, such as reading the newspaper or watching television (PHQ Adolescent also includes...like school work): 0 Moving or speaking so slowly that other people could have noticed. Or the opposite - being so fidgety or restless that you have been moving around a lot more than usual: 0 Thoughts that you would be better off dead, or of hurting yourself in some way: 0 PHQ-9 Total Score: 0 If you checked off any problems, how difficult have these problems  made it for you to do your work, take care of things at home, or get along with other people?: Not difficult at all  Depression Treatment Depression Interventions/Treatment : [id number] Score <4 Follow-up Not Indicated     Goals Addressed             This Visit's Progress    Patient Stated       03/06/2024, stay consistent with exercise             Objective:    Today's Vitals   03/06/24 1606  BP: 120/68  Pulse: 76  Temp: 98.3 F (36.8 C)  TempSrc: Oral  Weight: 143 lb 12.8 oz (65.2 kg)  Height: 5' 3 (1.6 m)   Body mass index is 25.47 kg/m.  Hearing/Vision screen Hearing Screening -  Comments:: Denies hearing issues Vision Screening - Comments:: Regular eye exams, [facility] Immunizations and Health Maintenance Health Maintenance  Topic Date Due   DTaP/Tdap/Td (1 - Tdap) Never done   Zoster Vaccines- Shingrix (1 of 2) Never done   Influenza Vaccine  05/23/2024 (Originally 09/24/2023)   Pneumococcal Vaccine: 50+ Years (1 of 2 - PCV) 11/30/2024 (Originally 12/15/1964)   Diabetic kidney evaluation - Urine ACR  04/09/2027 (Originally 12/14/1963)   HEMOGLOBIN A1C  05/15/2024   Medicare Annual Wellness (AWV)  03/06/2025   Diabetic kidney evaluation - eGFR measurement  11/15/2028   Bone Density Scan  Completed   Meningococcal B Vaccine  Aged Out   FOOT EXAM  Discontinued   Mammogram  Discontinued   OPHTHALMOLOGY EXAM  Discontinued   COVID-19 Vaccine  Discontinued   Hepatitis C Screening  Discontinued        Assessment/Plan:  This is a routine wellness examination for Betty [name].  Patient Care Team: Thedora Garnette HERO, MD as PCP - General [facility] [place]  I have personally reviewed and noted the following in the patients chart:   Medical and social history Use of alcohol, tobacco or illicit drugs  Current medications and supplements including opioid prescriptions. Functional ability and status Nutritional status Physical activity Advanced directives List of other physicians Hospitalizations, surgeries, and ER visits in previous 12 months Vitals Screenings to include cognitive, depression, and falls Referrals and appointments  No orders of the defined types were placed in this encounter.  In addition, I have reviewed and discussed with patient certain preventive protocols, quality metrics, and best practice recommendations. A written personalized care plan for preventive services as well as general preventive health recommendations were provided to patient.   Ardella FORBES Dawn, LPN   [id number]/7973   Return in 1 year (on 03/06/2025).  After  Visit Summary: (In Person-Printed) AVS printed and given to the patient  Nurse Notes: Vaccines not given: TDAP and shingles declined today  "

## 2024-03-08 ENCOUNTER — Encounter: Payer: Self-pay | Admitting: Rehabilitative and Restorative Service Providers"

## 2024-03-08 ENCOUNTER — Ambulatory Visit: Admitting: Rehabilitative and Restorative Service Providers"

## 2024-03-08 DIAGNOSIS — R293 Abnormal posture: Secondary | ICD-10-CM

## 2024-03-08 DIAGNOSIS — M6281 Muscle weakness (generalized): Secondary | ICD-10-CM | POA: Diagnosis not present

## 2024-03-08 DIAGNOSIS — M25612 Stiffness of left shoulder, not elsewhere classified: Secondary | ICD-10-CM | POA: Diagnosis not present

## 2024-03-08 DIAGNOSIS — M25512 Pain in left shoulder: Secondary | ICD-10-CM

## 2024-03-08 NOTE — Therapy (Signed)
 " OUTPATIENT PHYSICAL THERAPY SHOULDER TREATMENT/PROGRESS/DISCHARGE NOTE  PHYSICAL THERAPY DISCHARGE SUMMARY  Visits from Start of Care: 4  Current functional level related to goals / functional outcomes: See note   Remaining deficits: See note   Education / Equipment: Updated HEP   Patient agrees to discharge. Patient goals were met. Patient is being discharged due to being pleased with the current functional level.    Patient Name: Betty [name] MRN: [id number] DOB:01-05-1946, 79 y.o., female Today's Date: 03/08/2024  END OF SESSION:  PT End of Session - 03/08/24 1510     Visit Number 4    Number of Visits 12    Date for Recertification  03/24/24    Authorization Type [employer]    Progress Note Due on Visit 12    PT Start Time 1510    PT Stop Time 1550    PT Time Calculation (min) 40 min    Activity Tolerance Patient tolerated treatment well;No increased pain;Patient limited by pain    Behavior During Therapy [facility] for tasks assessed/performed             Past Medical History:  Diagnosis Date   High cholesterol    History of pulmonary embolus (PE) - Dx'd 05/2018  06/21/2018   Past Surgical History:  Procedure Laterality Date   CESAREAN SECTION     EYE SURGERY     LAPAROSCOPIC APPENDECTOMY N/A 07/06/2019   Procedure: APPENDECTOMY LAPAROSCOPIC, TAP BLOCK;  Surgeon: Sheldon Standing, MD;  Location: [facility];  Service: General;  Laterality: N/A;   Patient Active Problem List   Diagnosis Date Noted   Sciatica of left side 05/18/2023   Allergic rhinitis 09/26/2021   Dysphagia 06/13/2020   Prediabetes 08/02/2019   Acute phlegmonous appendicitis s/p lap appendectomy 07/06/2019 07/05/2019   Noncompliance with medication treatment due to intermittent use of medication 07/05/2019   Cataract 05/03/2019   Osteopenia of left upper arm 05/03/2019   Arthritis of left acromioclavicular joint 05/03/2019   Chronic pain of both shoulders 05/03/2019   HSV (herpes simplex virus)  infection 02/28/2019   Gastroesophageal reflux disease 10/21/2018   Hyperlipidemia 10/21/2018   History of pulmonary embolus (PE) - Dx'd 05/2018  06/21/2018    PCP: Garnette EMERSON Simpler, MD  REFERRING PROVIDER: Kay CHRISTELLA Cummins, MD  REFERRING DIAG:  M19.012 (ICD-10-CM) - Primary osteoarthritis, left shoulder   THERAPY DIAG:  Abnormal posture  Muscle weakness (generalized)  Stiffness of left shoulder, not elsewhere classified  Acute pain of left shoulder  Rationale for Evaluation and Treatment: Rehabilitation  ONSET DATE: 6-8 weeks ago with reaching up in a cabinet  SUBJECTIVE:  SUBJECTIVE STATEMENT: [name] notes little pain or functional difficulty over the past week or so.  HEP participation has been good as well.  She feels ready for transition into independent rehabilitation.  [name] notes her left shoulder flared-up 6-8 weeks ago when reaching up into a cabinet.  She doesn't sleep on her left side anymore.  Difficulty with sleep is improving.   PERTINENT HISTORY: High cholesterol, DM, history of PE 2020, Lt sciatica, pre-diabetes, Lt humerus osteopenia  PAIN:  Are you having pain? Yes: NPRS scale:  0/10 this week (was 3-7/10) Pain location: Left shoulder Pain description: Ache, rare sharp Aggravating factors: Sleeping, reaching, overhead and behind the back function much better Relieving factors: Occasional heat and lidocaine   PRECAUTIONS: None  RED FLAGS: None   WEIGHT BEARING RESTRICTIONS: No  FALLS:  Has patient fallen in last 6 months? No  LIVING ENVIRONMENT: Lives with: lives alone Lives in: House/apartment Stairs: No problems Has following equipment at home: None  OCCUPATION: Retired  PLOF: Independent  PATIENT GOALS: Return to making beads.  Walk and do normal activities  without left shoulder pain.  NEXT MD VISIT: NA  OBJECTIVE:  Note: Objective measures were completed at Evaluation unless otherwise noted.  DIAGNOSTIC FINDINGS:  1. Moderate acromioclavicular osteoarthritis on the left with mild glenohumeral osteoarthritis on the left.  PATIENT SURVEYS:  PSFS: THE PATIENT SPECIFIC FUNCTIONAL SCALE  Place score of 0-10 (0 = unable to perform activity and 10 = able to perform activity at the same level as before injury or problem)  Activity Date: 01/28/2024 03/08/2024   Reach 5 9   2.  Overhead function 5 8   3.  Reaching behind the back 4 7   4.  Sleeping 8 8   Total Score 5.5 8     Total Score = Sum of activity scores/number of activities  Minimally Detectable Change: 3 points (for single activity); 2 points (for average score)  [facility] (nd). The Patient Specific Functional Scale . Retrieved from [email or web address].com.pt   COGNITION: Overall cognitive status: Within functional limits for tasks assessed     SENSATION: WFL  POSTURE: Significant for mild forward head, internally rotated and protracted shoulders  UPPER EXTREMITY ROM:   Passive ROM Left/Right 01/28/2024 Left 02/11/2024 Left 02/25/2024 Left/Right 03/08/2024  Shoulder flexion 125/150 145 145 145/155  Shoulder extension      Shoulder abduction      Shoulder horizontal adduction 35/30 45 35 45/45  Shoulder internal rotation 20/35 40 60 50/70  Shoulder external rotation 90/110 80 90 90/100  Elbow flexion      Elbow extension      Wrist flexion      Wrist extension      Wrist ulnar deviation      Wrist radial deviation      Wrist pronation      Wrist supination      (Blank rows = not tested)  UPPER EXTREMITY STRENGTH:  In pounds assessed with a hand-held dynamometer Left/Right 01/28/2024 Left/Right 02/25/2024 Left/Right 03/08/2024  Shoulder flexion     Shoulder extension     Shoulder abduction     Shoulder  adduction     Shoulder internal rotation 14.0/18.3 17.6/20.3 21.0/26.1  Shoulder external rotation 9.2/12.4 12.5/14.7 13.1/17.3  Middle trapezius     Lower trapezius     Elbow flexion     Elbow extension     Wrist flexion     Wrist extension     Wrist ulnar deviation  Wrist radial deviation     Wrist pronation     Wrist supination     Grip strength (lbs)     (Blank rows = not tested)                                                                                                       TREATMENT DATE:  03/08/2024 Supine arm raise/scapular protraction with 3 pounds 20 x 3 seconds Scapular retraction/shoulder blade pinch reviewed Red Thera-Band external rotation 2 sets of 10 x 3 seconds Red Thera-Band internal rotation 2 sets of 10 x 3 seconds  Functional Activities: Thumb up the back stretch 5 x 10 seconds (reaching behind the back) Supine shoulder flexion with protraction (palm facing in, stay within comfortable range) 10 x 10 seconds (overhead function) Posterior capsule stretch 5 x 10 seconds (reaching and cross body function)  97535: Reviewed reassessment measures obtained today; reviewed Reames-term maintenance updates and changes to her Przybylski-term home exercises   02/25/2024 Supine arm raise/scapular protraction with 3 pounds 20 x 3 seconds Supine internal rotation stretch 70 degrees abduction and elbows on pillows to elevate the elbow above shoulder height, reaching towards the pocket 20 x 10 seconds Scapular retraction/shoulder blade pinch reviewed Red Thera-Band external rotation 2 sets of 10 x 3 seconds Red Thera-Band internal rotation 2 sets of 10 x 3 seconds  Functional Activities: Thumb up the back stretch reviewed Supine shoulder flexion with protraction (palm facing in, stay within comfortable range) 10 x 10 seconds  02464: Reviewed objective measures obtained today; reviewed updates and changes to her current home exercises   02/11/2024 Supine arm  raise/scapular protraction with 3 pounds 20 x 3 seconds Supine internal rotation stretch 70 degrees abduction and elbows on pillows to elevate the elbow above shoulder height, reaching towards the pocket 20 x 10 seconds Scapular retraction/shoulder blade pinch 10 x 5 seconds Red Thera-Band external rotation 10 x 3 seconds Red Thera-Band internal rotation 10 x 3 seconds  Functional Activities: Thumb up the back stretch 10 x 10 seconds Supine shoulder flexion with protraction (palm facing in, stay within comfortable range) 10 x 10 seconds  02464: Reviewed objective measures obtained today; reviewed updates and progressions to her current home exercises  PATIENT EDUCATION: Education details: See above Person educated: Patient Education method: Explanation, Demonstration, Tactile cues, Verbal cues, and Handouts Education comprehension: verbalized understanding, returned demonstration, verbal cues required, tactile cues required, and needs further education  HOME EXERCISE PROGRAM: Access Code: [email or web address] URL: [email or web address] Date: 03/08/2024 Prepared by: Lamar Ivory  Exercises - Supine Scapular Protraction in Flexion with Dumbbells  - 1 x daily - 3 x weekly - 2 sets - 20 reps - 3 seconds hold - Standing Scapular Retraction  - 5 x daily - 7 x weekly - 1 sets - 4-5 reps - 5 second hold - Supine Shoulder Internal Rotation Stretch  - 2 x daily - 1 x weekly - 1 sets - 20 reps - 10 seconds hold - Shoulder External Rotation with Anchored Resistance  - 1 x daily - 3 x weekly -  2 sets - 10 reps - 3 hold - Shoulder Internal Rotation with Resistance  - 1 x daily - 3 x weekly - 2 sets - 10 reps - 3 hold - Standing Shoulder Internal Rotation Stretch with Hands Behind Back  - 1 x daily - 7 x weekly - 1 sets - 5-10 reps - 10 seconds hold - Supine Shoulder Flexion AAROM with Dowel  - 1 x daily - 7 x weekly - 1 sets - 5-10 reps - 10 seconds hold - Standing Shoulder Posterior Capsule  Stretch  - 1 x daily - 1 x weekly - 1 sets - 5-10 reps - 10 seconds hold  ASSESSMENT:  CLINICAL IMPRESSION: Queen has been pain-free and been doing great with her upper extremity function.  She feels comfortable with her current home exercises and appears ready for transfer into independent rehabilitation.   Patient is a 79 y.o. female who was seen today for physical therapy evaluation and treatment for  M19.012 (ICD-10-CM) - Primary osteoarthritis, left shoulder  .  Floy notes difficulty with reaching, overhead and behind the back function.  She had impairments in active range of motion, flexibility and shoulder strength.  Particular impairments were noted in posterior capsule flexibility, scapular and rotator cuff strength.  She was started on an appropriate home exercise program to meet the below listed goals.  OBJECTIVE IMPAIRMENTS: decreased activity tolerance, decreased endurance, decreased knowledge of condition, decreased ROM, decreased strength, impaired perceived functional ability, impaired UE functional use, postural dysfunction, and pain.   ACTIVITY LIMITATIONS: carrying, lifting, and reach over head  PARTICIPATION LIMITATIONS: meal prep, cleaning, and community activity  PERSONAL FACTORS: High cholesterol, DM, history of PE 2020, Lt sciatica, pre-diabetes, Lt humerus osteopenia are also affecting patient's functional outcome.   REHAB POTENTIAL: Good  CLINICAL DECISION MAKING: Stable/uncomplicated  EVALUATION COMPLEXITY: Low   GOALS: Goals reviewed with patient? Yes  SHORT TERM GOALS: Target date: 02/25/2023  Davette will be independent with her day 1 home exercise program Baseline: Started 01/28/2024 Goal status: Met 02/11/2024  2.  Improve shoulder active range of motion for flexion to 150/150; IR to 45/45 and horizontal adduction to 40/40 Baseline: 125/150; 20/35 and 35/30 Goal status: Partially met 03/08/2024  3.  Improve bilateral shoulder strength as assessed  by hand-held dynamometer Baseline: See objective Goal status: Met 02/25/2024   [place] TERM GOALS: Target date: 03/25/2023  Improve patient-specific functional score to at least 7.5 Baseline: 5.5 Goal status: Met 03/08/2024  2.  Improve left shoulder pain to no greater than 3/10 on the visual analog scale Baseline: 3-7/10 Goal status: Met 03/08/2024  3.  Improve bilateral shoulder internal rotation to at least 50 degrees Baseline: 20/35 degrees Goal status: Met 02/25/2024  4.  Improve bilateral shoulder strength for external rotation to at least 15 pounds and internal rotation to at least 22 pounds Baseline: 9.2/12.4 and 14.0/18.3 pounds Goal status: Partially Met 03/08/2024  5.  Delene will be independent with her Daley-term maintenance home exercise program at discharge Baseline: Started 01/28/2024 Goal status: Met 03/08/2024  PLAN:  PT FREQUENCY: DC  PT DURATION: DC  PLANNED INTERVENTIONS: 97110-Therapeutic exercises, 97530- Therapeutic activity, 97112- Neuromuscular re-education, 97535- Self Care, 02859- Manual therapy, Patient/Family education, Joint mobilization, Cryotherapy, and Moist heat  PLAN FOR NEXT SESSION: DC today   Myer LELON Ivory, PT, MPT 03/08/2024, 5:11 PM  "

## 2024-03-13 NOTE — Assessment & Plan Note (Addendum)
 I will check lipids today to determine improvement with pravastatin  40 mg daily.

## 2024-03-13 NOTE — Assessment & Plan Note (Signed)
 -  Continue omeprazole  20 mg daily

## 2024-03-13 NOTE — Progress Notes (Incomplete)
 " [facility] [address] [place] [address] Dept: [phone] Dept Fax: [phone]  Chronic Care Office Visit  Subjective:    Patient ID: Betty [name], female    DOB: 02-18-1946, 79 y.o..   MRN: [id number]  No chief complaint on file.  History of Present Illness:  Patient is in today for reassessment of chronic medical conditions.   Betty [name] has a history of hyperlipidemia. She is managed on pravastatin  40 mg daily, increased at last visit due as her lipid panel was still elevated above goal.   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   Gastroesophageal reflux disease 10/21/2018   Hyperlipidemia 10/21/2018   History of pulmonary embolus (PE) - Dx'd 05/2018  06/21/2018   Past Surgical History:  Procedure Laterality Date   CESAREAN SECTION     EYE SURGERY     LAPAROSCOPIC APPENDECTOMY N/A 07/06/2019   Procedure: APPENDECTOMY LAPAROSCOPIC, TAP BLOCK;  Surgeon: Sheldon Standing, MD;  Location: [facility];  Service: General;  Laterality: N/A;   Family History  Problem Relation Age of Onset   Hypertension Mother    Hypertension Father    Alzheimer's disease Father    Cancer Daughter        Breast   Cancer Paternal Aunt    Heart disease Paternal Grandmother    Outpatient Medications Prior to Visit  Medication Sig Dispense Refill   Ascorbic Acid  (VITAMIN C ) 500 MG CAPS Take 500 capsules by mouth  daily.     b complex vitamins capsule Take 1 capsule by mouth daily.     carboxymethylcellulose (REFRESH PLUS) 0.5 % SOLN 1 drop 3 (three) times daily as needed.     cholecalciferol (VITAMIN D3) 25 MCG (1000 UNIT) tablet Take 1,000 Units by mouth daily.     cyclobenzaprine  (FLEXERIL ) 5 MG tablet Take 1 tablet (5 mg total) by mouth at bedtime as needed for muscle spasms. (Patient not taking: Reported on 03/06/2024) 10 tablet 0   fluticasone  (FLONASE ) 50 MCG/ACT nasal spray Place 1 spray into both nostrils daily. 16 g 6   lidocaine  (LIDODERM ) 5 % Place 1 patch onto the skin daily. Remove & Discard patch within 12 hours or as directed by MD 10 patch 0   LORATADINE PO Take by mouth.     Multiple Vitamin (MULTIVITAMIN WITH MINERALS) TABS tablet Take 1 tablet by mouth daily.     naproxen  (NAPROSYN ) 500 MG tablet Take 1 tablet (500 mg total) by mouth 2 (two) times daily with a meal. (Patient not taking: Reported on 03/06/2024) 14 tablet 0   omeprazole  (PRILOSEC) 20 MG capsule TAKE 1 CAPSULE BY MOUTH ONCE DAILY AS NEEDED 30 capsule 5   pravastatin  (PRAVACHOL ) 40 MG tablet Take 1 tablet (40 mg total) by mouth daily. 90 tablet 3   vitamin B-12 (CYANOCOBALAMIN ) 100 MCG tablet Take 100 mcg by mouth every other day.     vitamin k 100 MCG tablet Take 100 mcg by mouth daily.  No facility-administered medications prior to visit.   Allergies[1]   Objective:   There were no vitals filed for this visit. There is no height or weight on file to calculate BMI.   General: Well developed, well nourished. No acute distress. HEENT: Normocephalic, non-traumatic. PERRL, EOMI. Conjunctiva clear. External ears normal. EAC and TMs normal bilaterally. Nose    clear without congestion or rhinorrhea. Mucous membranes moist. Oropharynx clear. Good dentition. Neck: Supple. No lymphadenopathy. No thyromegaly. Lungs: Clear to auscultation bilaterally. No wheezing, rales or rhonchi. CV: RRR without murmurs or rubs. Pulses 2+  bilaterally. Abdomen: Soft, non-tender. Bowel sounds positive, normal pitch and frequency. No hepatosplenomegaly. No rebound or guarding. Back: Straight. No CVA tenderness bilaterally. Extremities: Full ROM. No joint swelling or tenderness. No edema noted. Skin: Warm and dry. No rashes. Neuro: CN II-XII intact. Normal sensation and DTR bilaterally. Psych: Alert and oriented. Normal mood and affect.  Health Maintenance Due  Topic Date Due   DTaP/Tdap/Td (1 - Tdap) Never done   Zoster Vaccines- Shingrix (1 of 2) Never done   Lab Results {Labs (Optional):29002}    Assessment & Plan:   Problem List Items Addressed This Visit   None   No follow-ups on file.   Betty CHRISTELLA Simpler, MD  I,Betty [name],acting as a scribe for Betty CHRISTELLA Simpler, MD.,have documented all relevant documentation on the behalf of Betty CHRISTELLA Simpler, MD.  I, Betty CHRISTELLA Simpler, MD, have reviewed all documentation for this visit. The documentation on 03/14/2024 for the exam, diagnosis, procedures, and orders are all accurate and complete.     [1]  Allergies Allergen Reactions   Sulfa Antibiotics Other (See Comments)    bristers   "

## 2024-03-14 ENCOUNTER — Ambulatory Visit: Admitting: Family Medicine

## 2024-03-14 DIAGNOSIS — R7303 Prediabetes: Secondary | ICD-10-CM

## 2024-03-14 DIAGNOSIS — E782 Mixed hyperlipidemia: Secondary | ICD-10-CM

## 2024-04-05 ENCOUNTER — Ambulatory Visit: Admitting: Family Medicine

## 2025-03-12 ENCOUNTER — Ambulatory Visit
